# Patient Record
Sex: Male | Born: 1985
Health system: Southern US, Community
[De-identification: ages and names within clinical notes are randomized; demographics above are authoritative.]

## PROBLEM LIST (undated history)

## (undated) DIAGNOSIS — M1990 Unspecified osteoarthritis, unspecified site: Secondary | ICD-10-CM

## (undated) DIAGNOSIS — C8590 Non-Hodgkin lymphoma, unspecified, unspecified site: Secondary | ICD-10-CM

## (undated) DIAGNOSIS — R569 Unspecified convulsions: Secondary | ICD-10-CM

## (undated) DIAGNOSIS — Z8619 Personal history of other infectious and parasitic diseases: Secondary | ICD-10-CM

## (undated) HISTORY — PX: WISDOM TOOTH EXTRACTION: SHX21

## (undated) HISTORY — PX: LYMPH NODE BIOPSY: SHX201

---

## 2007-08-18 DIAGNOSIS — J309 Allergic rhinitis, unspecified: Secondary | ICD-10-CM | POA: Insufficient documentation

## 2012-11-22 DIAGNOSIS — Z9221 Personal history of antineoplastic chemotherapy: Secondary | ICD-10-CM | POA: Insufficient documentation

## 2012-11-22 DIAGNOSIS — Z923 Personal history of irradiation: Secondary | ICD-10-CM | POA: Insufficient documentation

## 2014-08-02 DIAGNOSIS — C8331 Diffuse large B-cell lymphoma, lymph nodes of head, face, and neck: Secondary | ICD-10-CM | POA: Insufficient documentation

## 2014-10-09 DIAGNOSIS — S43431A Superior glenoid labrum lesion of right shoulder, initial encounter: Secondary | ICD-10-CM | POA: Insufficient documentation

## 2015-09-27 ENCOUNTER — Ambulatory Visit
Admission: RE | Admit: 2015-09-27 | Discharge: 2015-09-27 | Disposition: A | Discharge status: Home / Self Care | Payer: BLUE CROSS/BLUE SHIELD | Source: Ambulatory Visit | Attending: Family Medicine | Admitting: Family Medicine

## 2015-09-27 ENCOUNTER — Other Ambulatory Visit: Payer: Self-pay | Admitting: Family Medicine

## 2015-09-27 DIAGNOSIS — M79671 Pain in right foot: Secondary | ICD-10-CM | POA: Diagnosis not present

## 2016-12-17 ENCOUNTER — Ambulatory Visit
Admission: RE | Admit: 2016-12-17 | Discharge: 2016-12-17 | Disposition: A | Discharge status: Home / Self Care | Payer: BLUE CROSS/BLUE SHIELD | Source: Ambulatory Visit | Attending: Family Medicine | Admitting: Family Medicine

## 2016-12-17 ENCOUNTER — Other Ambulatory Visit: Payer: Self-pay | Admitting: Family Medicine

## 2016-12-17 DIAGNOSIS — M25532 Pain in left wrist: Secondary | ICD-10-CM | POA: Diagnosis not present

## 2017-01-28 DIAGNOSIS — G5603 Carpal tunnel syndrome, bilateral upper limbs: Secondary | ICD-10-CM | POA: Insufficient documentation

## 2017-04-14 DIAGNOSIS — C8590 Non-Hodgkin lymphoma, unspecified, unspecified site: Secondary | ICD-10-CM | POA: Insufficient documentation

## 2017-05-14 DIAGNOSIS — M20029 Boutonniere deformity of unspecified finger(s): Secondary | ICD-10-CM | POA: Insufficient documentation

## 2017-05-14 DIAGNOSIS — M25519 Pain in unspecified shoulder: Secondary | ICD-10-CM | POA: Insufficient documentation

## 2017-05-24 DIAGNOSIS — A6001 Herpesviral infection of penis: Secondary | ICD-10-CM | POA: Insufficient documentation

## 2017-05-24 DIAGNOSIS — K219 Gastro-esophageal reflux disease without esophagitis: Secondary | ICD-10-CM | POA: Insufficient documentation

## 2017-05-28 ENCOUNTER — Other Ambulatory Visit: Payer: Self-pay | Admitting: Orthopedic Surgery

## 2017-06-03 DIAGNOSIS — Z713 Dietary counseling and surveillance: Secondary | ICD-10-CM | POA: Insufficient documentation

## 2017-06-07 ENCOUNTER — Encounter: Payer: Self-pay | Admitting: *Deleted

## 2017-06-08 NOTE — Discharge Instructions (Signed)
General Anesthesia, Adult, Care After °These instructions provide you with information about caring for yourself after your procedure. Your health care provider may also give you more specific instructions. Your treatment has been planned according to current medical practices, but problems sometimes occur. Call your health care provider if you have any problems or questions after your procedure. °What can I expect after the procedure? °After the procedure, it is common to have: °· Vomiting. °· A sore throat. °· Mental slowness. ° °It is common to feel: °· Nauseous. °· Cold or shivery. °· Sleepy. °· Tired. °· Sore or achy, even in parts of your body where you did not have surgery. ° °Follow these instructions at home: °For at least 24 hours after the procedure: °· Do not: °? Participate in activities where you could fall or become injured. °? Drive. °? Use heavy machinery. °? Drink alcohol. °? Take sleeping pills or medicines that cause drowsiness. °? Make important decisions or sign legal documents. °? Take care of children on your own. °· Rest. °Eating and drinking °· If you vomit, drink water, juice, or soup when you can drink without vomiting. °· Drink enough fluid to keep your urine clear or pale yellow. °· Make sure you have little or no nausea before eating solid foods. °· Follow the diet recommended by your health care provider. °General instructions °· Have a responsible adult stay with you until you are awake and alert. °· Return to your normal activities as told by your health care provider. Ask your health care provider what activities are safe for you. °· Take over-the-counter and prescription medicines only as told by your health care provider. °· If you smoke, do not smoke without supervision. °· Keep all follow-up visits as told by your health care provider. This is important. °Contact a health care provider if: °· You continue to have nausea or vomiting at home, and medicines are not helpful. °· You  cannot drink fluids or start eating again. °· You cannot urinate after 8-12 hours. °· You develop a skin rash. °· You have fever. °· You have increasing redness at the site of your procedure. °Get help right away if: °· You have difficulty breathing. °· You have chest pain. °· You have unexpected bleeding. °· You feel that you are having a life-threatening or urgent problem. °This information is not intended to replace advice given to you by your health care provider. Make sure you discuss any questions you have with your health care provider. °Document Released: 10/12/2000 Document Revised: 12/09/2015 Document Reviewed: 06/20/2015 °Elsevier Interactive Patient Education © 2018 [employer] ° °

## 2017-06-14 ENCOUNTER — Encounter
Admission: RE | Disposition: A | Discharge status: Home / Self Care | Payer: Self-pay | Source: Ambulatory Visit | Attending: Orthopedic Surgery

## 2017-06-14 ENCOUNTER — Ambulatory Visit
Admission: RE | Admit: 2017-06-14 | Discharge: 2017-06-14 | Disposition: A | Discharge status: Home / Self Care | Payer: BLUE CROSS/BLUE SHIELD | Source: Ambulatory Visit | Attending: Orthopedic Surgery | Admitting: Orthopedic Surgery

## 2017-06-14 ENCOUNTER — Ambulatory Visit: Payer: BLUE CROSS/BLUE SHIELD | Admitting: Anesthesiology

## 2017-06-14 DIAGNOSIS — Z8571 Personal history of Hodgkin lymphoma: Secondary | ICD-10-CM | POA: Insufficient documentation

## 2017-06-14 DIAGNOSIS — Y929 Unspecified place or not applicable: Secondary | ICD-10-CM | POA: Insufficient documentation

## 2017-06-14 DIAGNOSIS — M7551 Bursitis of right shoulder: Secondary | ICD-10-CM | POA: Diagnosis not present

## 2017-06-14 DIAGNOSIS — X503XXA Overexertion from repetitive movements, initial encounter: Secondary | ICD-10-CM | POA: Diagnosis not present

## 2017-06-14 DIAGNOSIS — S43431A Superior glenoid labrum lesion of right shoulder, initial encounter: Secondary | ICD-10-CM | POA: Insufficient documentation

## 2017-06-14 HISTORY — DX: Unspecified osteoarthritis, unspecified site: M19.90

## 2017-06-14 HISTORY — PX: SHOULDER ARTHROSCOPY WITH LABRAL REPAIR: SHX5691

## 2017-06-14 HISTORY — DX: Non-Hodgkin lymphoma, unspecified, unspecified site: C85.90

## 2017-06-14 HISTORY — DX: Unspecified convulsions: R56.9

## 2017-06-14 SURGERY — ARTHROSCOPY, SHOULDER, WITH GLENOID LABRUM REPAIR
Anesthesia: Regional | Laterality: Right | Wound class: Clean

## 2017-06-14 MED ORDER — CHLORHEXIDINE GLUCONATE CLOTH 2 % EX PADS: 6.0000 | MEDICATED_PAD | Freq: Once | CUTANEOUS | Status: DC

## 2017-06-14 MED ORDER — LACTATED RINGERS IV SOLN
10.0000 mL/h | INTRAVENOUS | Status: DC
  Administered 2017-06-14: 10 mL/h via INTRAVENOUS
  Administered 2017-06-14: 10:00:00 via INTRAVENOUS

## 2017-06-14 MED ORDER — MIDAZOLAM HCL 2 MG/2ML IJ SOLN
INTRAMUSCULAR | Status: DC | PRN
  Administered 2017-06-14: 2 mg via INTRAVENOUS

## 2017-06-14 MED ORDER — EPHEDRINE SULFATE 50 MG/ML IJ SOLN
INTRAMUSCULAR | Status: DC | PRN
  Administered 2017-06-14 (×2): 10 mg via INTRAVENOUS

## 2017-06-14 MED ORDER — PROPOFOL 10 MG/ML IV BOLUS
INTRAVENOUS | Status: DC | PRN
  Administered 2017-06-14: 200 mg via INTRAVENOUS

## 2017-06-14 MED ORDER — GLYCOPYRROLATE 0.2 MG/ML IJ SOLN
INTRAMUSCULAR | Status: DC | PRN
  Administered 2017-06-14: 0.2 mg via INTRAVENOUS

## 2017-06-14 MED ORDER — ROPIVACAINE HCL 5 MG/ML IJ SOLN
INTRAMUSCULAR | Status: DC | PRN
  Administered 2017-06-14: 40 mL via PERINEURAL

## 2017-06-14 MED ORDER — CEFAZOLIN SODIUM-DEXTROSE 2-4 GM/100ML-% IV SOLN
2.0000 g | INTRAVENOUS | Status: AC
  Administered 2017-06-14: 2 g via INTRAVENOUS

## 2017-06-14 MED ORDER — ONDANSETRON HCL 4 MG/2ML IJ SOLN
INTRAMUSCULAR | Status: DC | PRN
  Administered 2017-06-14: 4 mg via INTRAVENOUS

## 2017-06-14 MED ORDER — ACETAMINOPHEN 160 MG/5ML PO SOLN: 325.0000 mg | ORAL | Status: DC | PRN

## 2017-06-14 MED ORDER — FENTANYL CITRATE (PF) 100 MCG/2ML IJ SOLN: 25.0000 ug | INTRAMUSCULAR | Status: DC | PRN

## 2017-06-14 MED ORDER — ACETAMINOPHEN 325 MG PO TABS
325.0000 mg | ORAL_TABLET | ORAL | Status: DC | PRN
  Administered 2017-06-14: 650 mg via ORAL

## 2017-06-14 MED ORDER — OXYCODONE HCL 5 MG PO TABS: 5.0000 mg | ORAL_TABLET | ORAL | 0 refills | Status: DC | PRN

## 2017-06-14 MED ORDER — ONDANSETRON HCL 4 MG PO TABS: 4.0000 mg | ORAL_TABLET | Freq: Three times a day (TID) | ORAL | 0 refills | Status: DC | PRN

## 2017-06-14 MED ORDER — FENTANYL CITRATE (PF) 100 MCG/2ML IJ SOLN
INTRAMUSCULAR | Status: DC | PRN
  Administered 2017-06-14: 100 ug via INTRAVENOUS

## 2017-06-14 MED ORDER — PROMETHAZINE HCL 25 MG/ML IJ SOLN: 6.2500 mg | INTRAMUSCULAR | Status: DC | PRN

## 2017-06-14 MED ORDER — LIDOCAINE HCL (PF) 1 % IJ SOLN
INTRAMUSCULAR | Status: DC | PRN
  Administered 2017-06-14: 10 mL

## 2017-06-14 MED ORDER — OXYCODONE HCL 5 MG PO TABS: 5.0000 mg | ORAL_TABLET | Freq: Once | ORAL | Status: DC | PRN

## 2017-06-14 MED ORDER — LIDOCAINE HCL (CARDIAC) 20 MG/ML IV SOLN
INTRAVENOUS | Status: DC | PRN
  Administered 2017-06-14: 40 mg via INTRATRACHEAL

## 2017-06-14 MED ORDER — BUPIVACAINE HCL (PF) 0.25 % IJ SOLN
INTRAMUSCULAR | Status: DC | PRN
  Administered 2017-06-14: 10 mL

## 2017-06-14 MED ORDER — OXYCODONE HCL 5 MG/5ML PO SOLN: 5.0000 mg | Freq: Once | ORAL | Status: DC | PRN

## 2017-06-14 SURGICAL SUPPLY — 42 items
ADAPTER IRRIG TUBE 2 SPIKE SOL (ADAPTER) ×4 IMPLANT
ANCHOR SUT  BIOC ST 3X14.5 (Orthopedic Implant) ×2 IMPLANT
ANCHOR SUT BIOC ST 3X14.5 (Orthopedic Implant) ×2 IMPLANT
BUR RADIUS 4.0X18.5 (BURR) ×2 IMPLANT
CANNULA 5.75X7 CRYSTAL CLEAR (CANNULA) ×2 IMPLANT
CANNULA PARTIAL THREAD 2X7 (CANNULA) ×2 IMPLANT
COOLER POLAR GLACIER W/PUMP (MISCELLANEOUS) ×2 IMPLANT
DRAPE INCISE IOBAN 66X45 STRL (DRAPES) ×2 IMPLANT
DRAPE SHEET LG 3/4 BI-LAMINATE (DRAPES) ×2 IMPLANT
DURAPREP 26ML APPLICATOR (WOUND CARE) ×6 IMPLANT
GAUZE PETRO XEROFOAM 1X8 (MISCELLANEOUS) ×2 IMPLANT
GAUZE SPONGE 4X4 12PLY STRL (GAUZE/BANDAGES/DRESSINGS) ×2 IMPLANT
GLOVE INDICATOR 8.0 STRL GRN (GLOVE) ×2 IMPLANT
GLOVE SURG 9.0 ORTHO LTXF (GLOVE) ×2 IMPLANT
GLOVE SURG ORTHO 8.5 STRL (GLOVE) ×2 IMPLANT
GOWN STRL REUS TWL 2XL XL LVL4 (GOWN DISPOSABLE) ×2 IMPLANT
GOWN STRL REUS W/ TWL LRG LVL3 (GOWN DISPOSABLE) ×1 IMPLANT
GOWN STRL REUS W/TWL LRG LVL3 (GOWN DISPOSABLE) ×1
IV LACTATED RINGER IRRG 3000ML (IV SOLUTION) ×9
IV LR IRRIG 3000ML ARTHROMATIC (IV SOLUTION) ×9 IMPLANT
KIT STABILIZATION SHOULDER (MISCELLANEOUS) ×2 IMPLANT
KIT SUTURETAK 3.0 INSERT PERC (KITS) ×2 IMPLANT
MASK FACE SPIDER DISP (MASK) ×2 IMPLANT
NEEDLE FILTER BLUNT 18X 1/2SAF (NEEDLE) ×1
NEEDLE FILTER BLUNT 18X1 1/2 (NEEDLE) ×1 IMPLANT
NEPTUNE MANIFOLD (MISCELLANEOUS) ×2 IMPLANT
PACK ARTHROSCOPY SHOULDER (MISCELLANEOUS) ×2 IMPLANT
PAD GROUND ADULT SPLIT (MISCELLANEOUS) ×2 IMPLANT
PAD WRAPON POLAR SHDR UNIV (MISCELLANEOUS) ×1 IMPLANT
SET TUBE SUCT SHAVER OUTFL 24K (TUBING) ×2 IMPLANT
SET TUBE TIP INTRA-ARTICULAR (MISCELLANEOUS) ×2 IMPLANT
STRIP CLOSURE SKIN 1/2X4 (GAUZE/BANDAGES/DRESSINGS) ×2 IMPLANT
SUT ETHILON 4-0 (SUTURE) ×2
SUT ETHILON 4-0 FS2 18XMFL BLK (SUTURE) ×2
SUT LASSO 90 DEG SD STR (SUTURE) ×2 IMPLANT
SUT PERFECTPASSER WHITE CART (SUTURE) ×2 IMPLANT
SUTURE ETHLN 4-0 FS2 18XMF BLK (SUTURE) ×2 IMPLANT
SYRINGE 10CC LL (SYRINGE) ×2 IMPLANT
TAPE MICROFOAM 4IN (TAPE) ×2 IMPLANT
TUBING ARTHRO INFLOW-ONLY STRL (TUBING) ×2 IMPLANT
WAND HAND CNTRL MULTIVAC 90 (MISCELLANEOUS) ×2 IMPLANT
WRAPON POLAR PAD SHDR UNIV (MISCELLANEOUS) ×2

## 2017-06-14 NOTE — Anesthesia Preprocedure Evaluation (Addendum)
Anesthesia Evaluation  Patient identified by MRN, date of birth, ID band Patient awake    Reviewed: Allergy & Precautions, NPO status   Airway Mallampati: II  TM Distance: >3 FB     Dental no notable dental hx.    Pulmonary neg pulmonary ROS,    breath sounds clear to auscultation       Cardiovascular negative cardio ROS   Rhythm:Regular Rate:Normal     Neuro/Psych    GI/Hepatic negative GI ROS, Neg liver ROS,   Endo/Other    Renal/GU negative Renal ROS     Musculoskeletal  (+) Arthritis ,   Abdominal   Peds  Hematology Non-Hodgkin's lymphoma - in remission, completed tx in 2014   Anesthesia Other Findings   Reproductive/Obstetrics                            Anesthesia Physical Anesthesia Plan  ASA: II  Anesthesia Plan: General and Regional   Post-op Pain Management:  Regional for Post-op pain   Induction: Intravenous  PONV Risk Score and Plan: Ondansetron and Midazolam  Airway Management Planned: LMA  Additional Equipment:   Intra-op Plan:   Post-operative Plan:   Informed Consent: I have reviewed the patients History and Physical, chart, labs and discussed the procedure including the risks, benefits and alternatives for the proposed anesthesia with the patient or authorized representative who has indicated his/her understanding and acceptance.   Dental advisory given  Plan Discussed with: CRNA  Anesthesia Plan Comments:        Anesthesia Quick Evaluation

## 2017-06-14 NOTE — Op Note (Signed)
06/14/2017  12:25 PM  PATIENT:  Thomas Mckay  31 y.o. male  PRE-OPERATIVE DIAGNOSIS:  Right shoulder superior labral tear  POST-OPERATIVE DIAGNOSIS:  Right shoulder superior labral tear and subacromial bursitis  PROCEDURE:  Procedure(s): RIGHT SHOULDER ARTHROSCOPIC SUPERIOR LABRAL REPAIR AND SUBACROMIAL DEBRIDEMENT/BURSECTOMY  SURGEON:  Surgeon(s) and Role:    Thornton Park, MD - Primary  ANESTHESIA:   local, general and paracervical block   PREOPERATIVE INDICATIONS:  Thomas Mckay is a  31 y.o. male with a diagnosis of superior labral tear of the right shoulder who failed conservative measures and elected for surgical management.    I discussed the risks and benefits of surgery. The risks include but are not limited to infection, bleeding, nerve or blood vessel injury, joint stiffness or loss of motion, persistent pain, weakness or instability, and hardware failure and the need for further surgery. Medical risks include but are not limited to DVT and pulmonary embolism, myocardial infarction, stroke, pneumonia, respiratory failure and death. Patient understood these risks and wished to proceed.   OPERATIVE IMPLANTS: Arthrex bio suturetak anchors  2   OPERATIVE FINDINGS:  Right shoulder superior labral tear   OPERATIVE PROCEDURE:  I met with the patient and his wife in the preoperative area.  He had a interscalene block in the pre-op area by the anesthesia service.  I signed the right shoulder according the hospital's correct site of surgery protocol.  I answered all questions by the patient and his wife. Patient was brought to the operating room. He underwent general endotracheal intubation. He was then positioned in a beachchair position. All bony prominences were adequately padded including the lower extremities.  Examination under anesthesia revealed no instability with load and shift testing, and a negative sulcus sign testing respectively.  The patient was then prepped and  draped in a sterile fashion. The patient received 2 grams of kefzol prior to the onset of the case.  A timeout was performed to verify the patient's name, date of birth, medical record number, correct site of surgery and correct procedure to be performed.The timeout was also used to verify the patient received antibiotics that all appropriate instruments, implants and radiographic studies were available in the room. Once all in attendance were in agreement case began.  Bony landmarks were drawn out with a surgical marker along with proposed incisions. These were pre-injected with 1% lidocaine plain. An 11 blade was used to establish a posterior portal through which the arthroscope was placed in the glenohumeral joint. An anterior portal was established under direct visualization using an 18-gauge spinal needle for localization. A 5.75 mm arthroscopic cannula was inserted through the anterior portal. A full diagnostic examination of the glenohumeral joint was performed.  Findings on arthroscopy included an isolated superior labral tear.  An anterolateral portal was established again under direct visualization using an 18-gauge spinal needle. A 7 mm cannula was placed through this anterolateral portal.  The nonarticular superior glenoid was then burred with a 4.81m resector shaver blade to remove torn fibers of the labrum until punctate bleeding was achieved An Arthrex BNIKEanchor was then placed into the nonarticular portion of superior glenoid.anterior to the biceps anchor.   A 90 degree Arthrex suture lasso was then used to shuttle a single limb of this anchor through the superior labrum.  Arthroscopic knot tying technique was then used to approximate the superior labrum to the glenoid.  A percutaneous technique was then used to drill a second anchor using the  Arthrex percutaneous drill guide set.   A second anchor was placed posterior to the biceps anchor after the hole was drilled.  The 90 degree  suture lasso was again used to pass a single limb of the BioSuturetak anchor under the labrum.  An arthroscopic knot tying technique was again used to repair the labrum back down to the glenoid.  The repair was then probed and found to have excellent stability.  Final arthroscopic images were taken.   The glenohumeral joint was then copiously irrigated.  All arthroscopic instruments were removed.    The arthroscope was placed into the subacromial space. Extensive bursitis was encountered. A lateral portal was established under direct visualization using an 18-gauge spinal needle. Through the lateral portal a 4.0 resector shaver blade and a 90 ArthroCare wand were used to debride subacromial bursitis. Final arthroscopic images were taken. The patient did not have subacromial spurring and did not require subacromial decompression or distal clavicle excision.  The four arthroscopic portals were closed with 4-0 nylon. A dry, sterile dressing was applied to the right shoulder, along with  a Polar Care sleeve. Patient's right arm was then placed in an abduction sling. He was awoken and brought to PACU in stable condition. I was scrubbed and present for the entire case and all sharp and instrument counts were correct at the conclusion the case. I spoke with the patient's wife in the postop consultation room to let her know the operation was successful and performed without complication.      Timoteo Gaul, MD

## 2017-06-14 NOTE — H&P (Signed)
The patient has been re-examined, and the chart reviewed, and there have been no interval changes to the documented history and physical.    The risks, benefits, and alternatives have been discussed at length, and the patient is willing to proceed.   

## 2017-06-14 NOTE — Anesthesia Procedure Notes (Signed)
Anesthesia Regional Block: Interscalene brachial plexus block   Pre-Anesthetic Checklist: ,, timeout performed, Correct Patient, Correct Site, Correct Laterality, Correct Procedure, Correct Position, site marked, Risks and benefits discussed,  Surgical consent,  Pre-op evaluation,  At surgeon's request and post-op pain management  Laterality: Right  Prep: chloraprep       Needles:  Injection technique: Single-shot  Needle Type: Stimiplex     Needle Length: 10cm  Needle Gauge: 21     Additional Needles:   Procedures:,,,, ultrasound used (permanent image in chart),,,,  Narrative:  Start time: 06/14/2017 8:40 AM End time: 06/14/2017 8:45 AM Injection made incrementally with aspirations every 5 mL.  Performed by: Personally  Anesthesiologist: Veda Canning, MD

## 2017-06-14 NOTE — Progress Notes (Addendum)
Assisted Dr. Veda Canning with right, interscalene brachial plexus nerve block. Side rails up, monitors on throughout procedure. See vital signs in flow sheet. Tolerated Procedure well.

## 2017-06-14 NOTE — Transfer of Care (Signed)
Immediate Anesthesia Transfer of Care Note  Patient: Thomas Mckay  Procedure(s) Performed: SHOULDER ARTHROSCOPY WITH LABRAL REPAIR and subacromial debridement. (Right )  Patient Location: PACU  Anesthesia Type: General, Regional  Level of Consciousness: awake, alert  and patient cooperative  Airway and Oxygen Therapy: Patient Spontanous Breathing and Patient connected to supplemental oxygen  Post-op Assessment: Post-op Vital signs reviewed, Patient's Cardiovascular Status Stable, Respiratory Function Stable, Patent Airway and No signs of Nausea or vomiting  Post-op Vital Signs: Reviewed and stable  Complications: No apparent anesthesia complications

## 2017-06-14 NOTE — Anesthesia Procedure Notes (Signed)
Procedure Name: LMA Insertion Performed by: Lind Guest, CRNA Pre-anesthesia Checklist: Patient identified, Emergency Drugs available, Suction available, Patient being monitored and Timeout performed Patient Re-evaluated:Patient Re-evaluated prior to induction Oxygen Delivery Method: Circle system utilized Preoxygenation: Pre-oxygenation with 100% oxygen Induction Type: IV induction LMA: LMA inserted LMA Size: 4.0

## 2017-06-14 NOTE — Anesthesia Postprocedure Evaluation (Signed)
Anesthesia Post Note  Patient: Thomas Mckay  Procedure(s) Performed: SHOULDER ARTHROSCOPY WITH LABRAL REPAIR and subacromial debridement. (Right )  Patient location during evaluation: PACU Anesthesia Type: Regional Level of consciousness: awake Pain management: pain level controlled Vital Signs Assessment: post-procedure vital signs reviewed and stable Respiratory status: respiratory function stable Cardiovascular status: stable Postop Assessment: no signs of nausea or vomiting Anesthetic complications: no    Veda Canning

## 2017-06-15 ENCOUNTER — Encounter: Payer: Self-pay | Admitting: Orthopedic Surgery

## 2018-10-06 ENCOUNTER — Ambulatory Visit: Payer: BLUE CROSS/BLUE SHIELD | Attending: Neurology

## 2018-10-06 DIAGNOSIS — R0683 Snoring: Secondary | ICD-10-CM | POA: Diagnosis not present

## 2018-10-06 DIAGNOSIS — F5101 Primary insomnia: Secondary | ICD-10-CM | POA: Insufficient documentation

## 2019-01-24 DIAGNOSIS — C8511 Unspecified B-cell lymphoma, lymph nodes of head, face, and neck: Secondary | ICD-10-CM | POA: Diagnosis not present

## 2019-01-24 DIAGNOSIS — R635 Abnormal weight gain: Secondary | ICD-10-CM | POA: Diagnosis not present

## 2019-01-24 DIAGNOSIS — C8331 Diffuse large B-cell lymphoma, lymph nodes of head, face, and neck: Secondary | ICD-10-CM | POA: Diagnosis not present

## 2019-02-01 ENCOUNTER — Other Ambulatory Visit: Payer: Self-pay

## 2019-02-01 ENCOUNTER — Ambulatory Visit: Payer: Self-pay | Admitting: Internal Medicine

## 2019-02-01 ENCOUNTER — Encounter: Payer: Self-pay | Admitting: Internal Medicine

## 2019-02-01 VITALS — BP 119/75 | HR 82 | Temp 97.9°F | Resp 16 | Ht 68.0 in | Wt 194.0 lb

## 2019-02-01 DIAGNOSIS — M25579 Pain in unspecified ankle and joints of unspecified foot: Secondary | ICD-10-CM | POA: Insufficient documentation

## 2019-02-01 DIAGNOSIS — M25559 Pain in unspecified hip: Secondary | ICD-10-CM | POA: Insufficient documentation

## 2019-02-01 DIAGNOSIS — S39011A Strain of muscle, fascia and tendon of abdomen, initial encounter: Secondary | ICD-10-CM

## 2019-02-01 DIAGNOSIS — S335XXA Sprain of ligaments of lumbar spine, initial encounter: Secondary | ICD-10-CM | POA: Insufficient documentation

## 2019-02-01 DIAGNOSIS — S93429A Sprain of deltoid ligament of unspecified ankle, initial encounter: Secondary | ICD-10-CM | POA: Insufficient documentation

## 2019-02-01 DIAGNOSIS — M7660 Achilles tendinitis, unspecified leg: Secondary | ICD-10-CM | POA: Insufficient documentation

## 2019-02-01 DIAGNOSIS — S6390XA Sprain of unspecified part of unspecified wrist and hand, initial encounter: Secondary | ICD-10-CM | POA: Insufficient documentation

## 2019-02-01 NOTE — Progress Notes (Signed)
Abd pain since Monday night.  States it's constant and achy -sore.  Has tried Ibu without relief.

## 2019-02-01 NOTE — Progress Notes (Signed)
S - Patient is a 33 y.o. 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 Started Monday evening after picking up his child. Has been more constant Pain described as achy and 3/10 at worst Location of pain is peri-umbilical No relationship to meals No N/V,  No fevers, no recent Covid sx's No diarrhea or constipation, last BM yesterday  No blood with BM's , no dark stool  No h/o major abdominal pains, + h/o reflux and this not like his reflux No tob history. Has gained weight in recent past Not lift weights with any regularity Works at [employer]    Current Outpatient Medications on File Prior to Visit  Medication Sig Dispense Refill  . valACYclovir (VALTREX) 500 MG tablet valacyclovir 500 mg tablet    . gabapentin (NEURONTIN) 300 MG capsule gabapentin 300 mg capsule    . ibuprofen (ADVIL) 200 MG tablet Take by mouth.    Marland Kitchen omeprazole (PRILOSEC) 40 MG capsule Take 40 mg daily by mouth.    . ondansetron (ZOFRAN) 4 MG tablet Take 1 tablet (4 mg total) by mouth every 8 (eight) hours as needed for nausea or vomiting. 30 tablet 0  . valACYclovir (VALTREX) 500 MG tablet Take 500 mg daily by mouth.     No current facility-administered medications on file prior to visit.     No Known Allergies    O - NAD, masked  BP 119/75 (BP Location: Right Arm, Patient Position: Sitting, Cuff Size: Normal)   Pulse 82   Temp 97.9 F (36.6 C) (Oral)   Resp 16   Ht 5\' 8"  (1.727 m)   Wt 194 lb (88 kg)   SpO2 96%   BMI 29.50 kg/m     Weight 186 in March  HEENT - sclera anicteric Car - RRR without m/g/r Abd - soft, obese, tender to palpate peri-umbilical, NT epigastrium, NT upper quadrants, NT lower quadrants, no rebound or guarding, questioned minimally distended, not marked, BS +, no masses, no HSM, no obvious hernia  Affect not flat, approp with conversation No increase pain with partial sit-up,  but notes feels achy like just worked out  Reviewed labs from March, 2020  Ass: 1. Abdominal Pain - prob abdominal muscle strain as source, no obvous hernia. Not sx's c/w any GI'itis, not likely GERD, not surgical concern  Educated on likely dx Relative rest from any heavy lifting, including kids, no straining with Valsalva type activities rec'ed, not do sit-ups routinely Can use ibuprofen, OTC prn short term and not use too frequently as can increase reflux sx's. Monitor, and if sx's increasing/worsening or not improving over time as outlined today, should f/u.  Not rush to any labs or any imaging studies presently

## 2019-02-03 ENCOUNTER — Telehealth: Payer: Self-pay

## 2019-02-03 NOTE — Telephone Encounter (Signed)
Please schedule a follow-up visit for him on Monday. If he has severe pain arise, any N/V, fevers, needs to go to ER to be seen immediately.

## 2019-02-03 NOTE — Telephone Encounter (Signed)
Pt states that he was told to call back if he had any lumps in his belly button area and he noticed that he now has a lump in that area. He is in the same amount of pain as the office visit

## 2019-02-03 NOTE — Telephone Encounter (Signed)
appt made for pt for Monday

## 2019-02-06 ENCOUNTER — Other Ambulatory Visit: Payer: Self-pay

## 2019-02-06 ENCOUNTER — Encounter: Payer: Self-pay | Admitting: Internal Medicine

## 2019-02-06 ENCOUNTER — Ambulatory Visit: Payer: Self-pay | Admitting: Internal Medicine

## 2019-02-06 VITALS — BP 123/68 | HR 89 | Temp 98.0°F | Resp 20 | Ht 67.0 in | Wt 193.0 lb

## 2019-02-06 DIAGNOSIS — R1033 Periumbilical pain: Secondary | ICD-10-CM

## 2019-02-06 NOTE — Progress Notes (Addendum)
Please refer to this addended note for the accurate documentation of the 7/20 office visit.   S - Patient is a 33 y.o. 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 Last note reviewed.  He called two days later noting he was told to call back if he had any lumps in his belly button area and he noticed that he now has a lump in that area. He noted was in the same amount of pain as the office visit. A f/u appt was made for today.   His sx's started Monday evening after picking up his child, and has remained since Pain described as achy and feels like just did a bunch of sit-ups.  Location of pain is peri-umbilical Still no relationship to meals, no vomiting No fevers, no recent Covid sx's No diarrhea or constipation No blood with BM's , no dark stool  No h/o major abdominal pains, + h/o reflux and this not like his reflux No tob history. Has gained a little weight in recent past Not lift weights with any regularity Works at [employer]  Meds taking - has not taken the gabapentin in the last two weeks, only med takes regularly over this time is alavert for allergies, and has tried some ibuprofen prn.  Current Outpatient Medications on File Prior to Visit  Medication Sig Dispense Refill  . gabapentin (NEURONTIN) 300 MG capsule gabapentin 300 mg capsule    . ibuprofen (ADVIL) 200 MG tablet Take by mouth.    Marland Kitchen omeprazole (PRILOSEC) 40 MG capsule Take 40 mg daily by mouth.    . ondansetron (ZOFRAN) 4 MG tablet Take 1 tablet (4 mg total) by mouth every 8 (eight) hours as needed for nausea or vomiting. 30 tablet 0  . valACYclovir (VALTREX) 500 MG tablet Take 500 mg daily by mouth.    . valACYclovir (VALTREX) 500 MG tablet valacyclovir 500 mg tablet     No current facility-administered medications on file prior to visit.     No Known  Allergies   O - NAD, masked  BP 123/68 (BP Location: Right Arm, Patient Position: Sitting, Cuff Size: Large)   Pulse 89   Temp 98 F (36.7 C) (Oral)   Resp 20   Ht 5\' 7"  (1.702 m)   Wt 193 lb (87.5 kg)   SpO2 95%   BMI 30.23 kg/m   Weight 186 in March. 194 last visit  HEENT - sclera anicteric Car - RRR  Abd - soft, obese, tender to palpate immediately over the umbilicus in the small protrusion in the upper half which was fleshy colored and not erythematous. Less tender to palpate the peri-umbilical region surrounding with more tender above than below or on either side, not marked NT epigastrium, NT upper quadrants, NT lower quadrants, no rebound or guarding, BS +, no masses, no obvious HSM Affect not flat, approp with conversation  Ass: 1. Abdominal Pain - Discussed last visit that abdominal muscle strain seemed a likely  source, and educated on this. Feel worth checking an ultrasound at this point to help assess for hernia concern   Educated on this  U/S ordered and office calling to help get arranged Relative rest from any heavy lifting, including kids, no straining with Valsalva type activities to continue Can use ibuprofen, OTC prn short term and not  use too frequently as can increase reflux sx's. Monitor, and if sx's increasing/worsening with the pain becoming more severe, should f/u. If he notices that the pain is suddenly more severe and very painful, needs to go to the ER to be assessed.

## 2019-02-13 ENCOUNTER — Ambulatory Visit
Admission: RE | Admit: 2019-02-13 | Discharge: 2019-02-13 | Disposition: A | Discharge status: Home / Self Care | Payer: 59 | Source: Ambulatory Visit | Attending: Internal Medicine | Admitting: Internal Medicine

## 2019-02-13 ENCOUNTER — Other Ambulatory Visit: Payer: Self-pay

## 2019-02-13 DIAGNOSIS — R1033 Periumbilical pain: Secondary | ICD-10-CM | POA: Diagnosis not present

## 2019-02-22 ENCOUNTER — Other Ambulatory Visit: Payer: Self-pay

## 2019-02-22 ENCOUNTER — Encounter: Payer: Self-pay | Admitting: Internal Medicine

## 2019-02-22 ENCOUNTER — Ambulatory Visit: Payer: Self-pay | Admitting: Internal Medicine

## 2019-02-22 VITALS — BP 125/79 | HR 62 | Temp 96.3°F | Resp 20 | Ht 67.0 in | Wt 194.0 lb

## 2019-02-22 DIAGNOSIS — R809 Proteinuria, unspecified: Secondary | ICD-10-CM | POA: Insufficient documentation

## 2019-02-22 DIAGNOSIS — R109 Unspecified abdominal pain: Secondary | ICD-10-CM

## 2019-02-22 DIAGNOSIS — R3121 Asymptomatic microscopic hematuria: Secondary | ICD-10-CM

## 2019-02-22 LAB — POCT URINALYSIS DIPSTICK
Bilirubin, UA: NEGATIVE
Glucose, UA: NEGATIVE
Ketones, UA: NEGATIVE
Leukocytes, UA: NEGATIVE
Nitrite, UA: NEGATIVE
Protein, UA: NEGATIVE
Spec Grav, UA: 1.02 (ref 1.010–1.025)
Urobilinogen, UA: 0.2 E.U./dL
pH, UA: 6 (ref 5.0–8.0)

## 2019-02-22 NOTE — Progress Notes (Signed)
Unable to assess temperature due to Marteze has been drinking water for the UA,

## 2019-02-22 NOTE — Progress Notes (Signed)
S - Patientis a 33 y.o. 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 An U/S was ordered and followed up today with a nurse visit to provide a urine specimen for f/u of prior proteinuria concern and I saw to review his U/S results as he noted he had questions..   His sx's started after picking up his child, and has persisted to date, with it feeling a little better in the more recent past. Still sensitive to touch the area in his belly button. Thinks may be eating less noted, but denied any more diffuse abdominal pains, no N/V, no fevers, no black stools, and no increased swelling or size of the small lump in his belly button.   No h/o major abdominal pains in his past, + h/o reflux and this not like his reflux No tob history.  Not lift weights with any regularity Works at [employer]  Meds taking - noted only med takes regularly is alavert for allergies, and has tried some ibuprofen prn.  Current Outpatient Medications on File Prior to Visit  Medication Sig Dispense Refill  . gabapentin (NEURONTIN) 300 MG capsule gabapentin 300 mg capsule    . ibuprofen (ADVIL) 200 MG tablet Take by mouth.    Marland Kitchen omeprazole (PRILOSEC) 40 MG capsule Take 40 mg daily by mouth.    . ondansetron (ZOFRAN) 4 MG tablet Take 1 tablet (4 mg total) by mouth every 8 (eight) hours as needed for nausea or vomiting. 30 tablet 0  . valACYclovir (VALTREX) 500 MG tablet Take 500 mg daily by mouth.    . valACYclovir (VALTREX) 500 MG tablet valacyclovir 500 mg tablet     No current facility-administered medications on file prior to visit.    No Known Allergies   O - NAD,masked  BP 125/79 (BP Location: Left Arm, Patient Position: Sitting, Cuff Size: Large)   Pulse 62   Temp (!)  96.3 F (35.7 C) (Axillary)   Resp 20   Ht 5\' 7"  (1.702 m)   Wt 194 lb (88 kg)   SpO2 97%   BMI 30.38 kg/m   Weight 193 last visit   HEENT - sclera anicteric Abd - soft,obese, stilltender to palpate immediately over the umbilicus in the small protrusion in the upper half which was fleshy colored and not erythematous. Affect not flat, approp with conversation  U/S results reviewed - small heterogeneous well-circumscribed nodular area noted that does not appear to represent herniated bowel.   Ass: 1. Abdominal Pain -likely abdominal muscle strain with a small protuberance in the umbilicus that persists and is very sensitive to touch, no evidence of herniated bowel concern by U/S, could be peri-umbilical fat and likely benign and a little better in more recent past   Educated on this  U/S result was printed out and reviewed with him in office Continue with relative rest from any heavy lifting, including kids, no straining with Valsalva type activities to continue Can use ibuprofen, OTC prn short term and not use too frequently as can increase reflux sx's noted last visit Monitor, and if sx's increasing/worsening with the pain again worsening, becoming more severe, should f/u. May need referral to surgeon if more problematic noted. If he notices that the  pain is suddenly more severe and very painful, needs to go to the ER to be assessed like noted last visit.   2. Mild proteinuria noted on annual physical March 2020, with the last 2 years prior having no protein in the urine. His glucose was 101 and his BP's have been good.  Asked for a recheck of his urine in 4-6 months and presented today for that recheck. The urine was negative for protein, but 1+ blood was noted. Negative otherwise.  Will have him f/u again in 4-6 weeks for another recheck of his urine and if blood persists, will send urine for microscopic UA and culture and have a f/u office visit scheduled as well.   3.  Hematuria - as above

## 2019-03-23 ENCOUNTER — Ambulatory Visit: Payer: 59

## 2019-03-23 ENCOUNTER — Other Ambulatory Visit: Payer: Self-pay

## 2019-03-23 DIAGNOSIS — R809 Proteinuria, unspecified: Secondary | ICD-10-CM

## 2019-03-23 DIAGNOSIS — R319 Hematuria, unspecified: Secondary | ICD-10-CM

## 2019-03-23 LAB — POCT URINALYSIS DIPSTICK
Bilirubin, UA: NEGATIVE
Blood, UA: POSITIVE
Glucose, UA: NEGATIVE
Ketones, UA: NEGATIVE
Leukocytes, UA: NEGATIVE
Nitrite, UA: NEGATIVE
Protein, UA: NEGATIVE
Spec Grav, UA: >=1.03 — AB (ref 1.010–1.025)
Urobilinogen, UA: 0.2 E.U./dL
pH, UA: 6 (ref 5.0–8.0)

## 2019-03-23 NOTE — Progress Notes (Signed)
Patient completed a POC urine dipstick and it was positive for blood, as per previous note on 02/22/19 urine will be sent out for urine micro at [facility]

## 2019-03-24 LAB — URINALYSIS, ROUTINE W REFLEX MICROSCOPIC
Bilirubin, UA: NEGATIVE
Glucose, UA: NEGATIVE
Ketones, UA: NEGATIVE
Leukocytes,UA: NEGATIVE
Nitrite, UA: NEGATIVE
Protein,UA: NEGATIVE
RBC, UA: NEGATIVE
Specific Gravity, UA: 1.025 (ref 1.005–1.030)
Urobilinogen, Ur: 0.2 mg/dL (ref 0.2–1.0)
pH, UA: 5.5 (ref 5.0–7.5)

## 2019-03-29 NOTE — Progress Notes (Signed)
Sent this to the patient on Mychart  The urine was sent to the lab after the preliminary result in the office noted possible blood in the urine. The lab result is attached and was completely negative, no protein or blood.  Will continue to monitor at present.

## 2019-05-08 DIAGNOSIS — M9902 Segmental and somatic dysfunction of thoracic region: Secondary | ICD-10-CM | POA: Diagnosis not present

## 2019-05-08 DIAGNOSIS — M9903 Segmental and somatic dysfunction of lumbar region: Secondary | ICD-10-CM | POA: Diagnosis not present

## 2019-05-08 DIAGNOSIS — S336XXA Sprain of sacroiliac joint, initial encounter: Secondary | ICD-10-CM | POA: Diagnosis not present

## 2019-05-08 DIAGNOSIS — M545 Low back pain: Secondary | ICD-10-CM | POA: Diagnosis not present

## 2019-05-08 DIAGNOSIS — M7918 Myalgia, other site: Secondary | ICD-10-CM | POA: Diagnosis not present

## 2019-05-08 DIAGNOSIS — S39012A Strain of muscle, fascia and tendon of lower back, initial encounter: Secondary | ICD-10-CM | POA: Diagnosis not present

## 2019-05-08 DIAGNOSIS — S233XXA Sprain of ligaments of thoracic spine, initial encounter: Secondary | ICD-10-CM | POA: Diagnosis not present

## 2019-05-08 DIAGNOSIS — M9904 Segmental and somatic dysfunction of sacral region: Secondary | ICD-10-CM | POA: Diagnosis not present

## 2019-05-11 DIAGNOSIS — M9903 Segmental and somatic dysfunction of lumbar region: Secondary | ICD-10-CM | POA: Diagnosis not present

## 2019-05-11 DIAGNOSIS — M545 Low back pain: Secondary | ICD-10-CM | POA: Diagnosis not present

## 2019-05-11 DIAGNOSIS — S336XXA Sprain of sacroiliac joint, initial encounter: Secondary | ICD-10-CM | POA: Diagnosis not present

## 2019-05-11 DIAGNOSIS — M7918 Myalgia, other site: Secondary | ICD-10-CM | POA: Diagnosis not present

## 2019-05-11 DIAGNOSIS — S39012A Strain of muscle, fascia and tendon of lower back, initial encounter: Secondary | ICD-10-CM | POA: Diagnosis not present

## 2019-05-11 DIAGNOSIS — S233XXA Sprain of ligaments of thoracic spine, initial encounter: Secondary | ICD-10-CM | POA: Diagnosis not present

## 2019-05-11 DIAGNOSIS — M9902 Segmental and somatic dysfunction of thoracic region: Secondary | ICD-10-CM | POA: Diagnosis not present

## 2019-05-11 DIAGNOSIS — M9904 Segmental and somatic dysfunction of sacral region: Secondary | ICD-10-CM | POA: Diagnosis not present

## 2019-06-20 DIAGNOSIS — M9903 Segmental and somatic dysfunction of lumbar region: Secondary | ICD-10-CM | POA: Diagnosis not present

## 2019-06-20 DIAGNOSIS — M7918 Myalgia, other site: Secondary | ICD-10-CM | POA: Diagnosis not present

## 2019-06-20 DIAGNOSIS — M545 Low back pain: Secondary | ICD-10-CM | POA: Diagnosis not present

## 2019-06-20 DIAGNOSIS — S39012A Strain of muscle, fascia and tendon of lower back, initial encounter: Secondary | ICD-10-CM | POA: Diagnosis not present

## 2019-06-20 DIAGNOSIS — S233XXA Sprain of ligaments of thoracic spine, initial encounter: Secondary | ICD-10-CM | POA: Diagnosis not present

## 2019-06-20 DIAGNOSIS — S336XXA Sprain of sacroiliac joint, initial encounter: Secondary | ICD-10-CM | POA: Diagnosis not present

## 2019-06-20 DIAGNOSIS — M9902 Segmental and somatic dysfunction of thoracic region: Secondary | ICD-10-CM | POA: Diagnosis not present

## 2019-06-20 DIAGNOSIS — M9904 Segmental and somatic dysfunction of sacral region: Secondary | ICD-10-CM | POA: Diagnosis not present

## 2019-06-27 DIAGNOSIS — M7581 Other shoulder lesions, right shoulder: Secondary | ICD-10-CM | POA: Diagnosis not present

## 2019-06-27 DIAGNOSIS — M545 Low back pain: Secondary | ICD-10-CM | POA: Diagnosis not present

## 2019-06-27 DIAGNOSIS — M9907 Segmental and somatic dysfunction of upper extremity: Secondary | ICD-10-CM | POA: Diagnosis not present

## 2019-06-27 DIAGNOSIS — S336XXA Sprain of sacroiliac joint, initial encounter: Secondary | ICD-10-CM | POA: Diagnosis not present

## 2019-06-27 DIAGNOSIS — M7918 Myalgia, other site: Secondary | ICD-10-CM | POA: Diagnosis not present

## 2019-06-27 DIAGNOSIS — M9902 Segmental and somatic dysfunction of thoracic region: Secondary | ICD-10-CM | POA: Diagnosis not present

## 2019-06-27 DIAGNOSIS — M9903 Segmental and somatic dysfunction of lumbar region: Secondary | ICD-10-CM | POA: Diagnosis not present

## 2019-06-27 DIAGNOSIS — S39012A Strain of muscle, fascia and tendon of lower back, initial encounter: Secondary | ICD-10-CM | POA: Diagnosis not present

## 2019-06-27 DIAGNOSIS — S233XXA Sprain of ligaments of thoracic spine, initial encounter: Secondary | ICD-10-CM | POA: Diagnosis not present

## 2019-06-27 DIAGNOSIS — M9904 Segmental and somatic dysfunction of sacral region: Secondary | ICD-10-CM | POA: Diagnosis not present

## 2019-07-04 ENCOUNTER — Encounter: Payer: Self-pay | Admitting: Emergency Medicine

## 2019-07-04 ENCOUNTER — Ambulatory Visit
Admission: EM | Admit: 2019-07-04 | Discharge: 2019-07-04 | Disposition: A | Discharge status: Home / Self Care | Payer: Managed Care, Other (non HMO) | Attending: Family Medicine | Admitting: Family Medicine

## 2019-07-04 ENCOUNTER — Other Ambulatory Visit: Payer: Self-pay

## 2019-07-04 ENCOUNTER — Telehealth: Payer: 59 | Admitting: Physician Assistant

## 2019-07-04 DIAGNOSIS — R69 Illness, unspecified: Secondary | ICD-10-CM | POA: Diagnosis not present

## 2019-07-04 DIAGNOSIS — Z113 Encounter for screening for infections with a predominantly sexual mode of transmission: Secondary | ICD-10-CM

## 2019-07-04 DIAGNOSIS — R360 Urethral discharge without blood: Secondary | ICD-10-CM | POA: Diagnosis not present

## 2019-07-04 DIAGNOSIS — Z8619 Personal history of other infectious and parasitic diseases: Secondary | ICD-10-CM

## 2019-07-04 DIAGNOSIS — R300 Dysuria: Secondary | ICD-10-CM

## 2019-07-04 HISTORY — DX: Personal history of other infectious and parasitic diseases: Z86.19

## 2019-07-04 LAB — URINALYSIS, COMPLETE (UACMP) WITH MICROSCOPIC
Bacteria, UA: NONE SEEN
Bilirubin Urine: NEGATIVE
Glucose, UA: NEGATIVE mg/dL
Leukocytes,Ua: NEGATIVE
Nitrite: NEGATIVE
Protein, ur: NEGATIVE mg/dL
Specific Gravity, Urine: >1.03 — ABNORMAL HIGH (ref 1.005–1.030)
Squamous Epithelial / LPF: NONE SEEN (ref 0–5)
WBC, UA: NONE SEEN WBC/hpf (ref 0–5)
pH: 5.5 (ref 5.0–8.0)

## 2019-07-04 MED ORDER — AZITHROMYCIN 500 MG PO TABS
1000.0000 mg | ORAL_TABLET | Freq: Once | ORAL | Status: AC
  Administered 2019-07-04: 1000 mg via ORAL

## 2019-07-04 MED ORDER — CEFTRIAXONE SODIUM 250 MG IJ SOLR
250.0000 mg | Freq: Once | INTRAMUSCULAR | Status: AC
  Administered 2019-07-04: 250 mg via INTRAMUSCULAR

## 2019-07-04 NOTE — ED Provider Notes (Signed)
Thomas Mckay, Thomas Mckay   Name: Thomas Mckay DOB: 1986-05-29 MRN: [id number] CSN: [id number] PCP: Towanda Malkin, MD  Arrival date and time:  07/04/19 1446  Chief Complaint:  Urinary Frequency and Penile Discharge   NOTE: Prior to seeing the patient today, I have reviewed the triage nursing documentation and vital signs. Clinical staff has updated patient's PMH/PSHx, current medication list, and drug allergies/intolerances to ensure comprehensive history available to assist in medical decision making.   History:   HPI: Thomas Mckay is a 33 y.o. male who presents today with complaints of urinary symptoms that began with acute onset 3-4 days ago. He complains of urinary frequency alone; no dysuria or urgency. He has not appreciated any gross hematuria, nor has he noticed his urine being malodorous. Patient denies any associated nausea, vomiting, fever, or chills. He has not experienced any pain in his lower back, flank area, or lower abdomen. He is not experiencing any difficulties with initiating his urine stream. No rectal pain or pressure. Patient advises that he does not have a history that is significant for recurrent urinary tract infections or urolithiasis. PMH (+) for STIs; has had genital herpes and chlamydia. Patient denies any current penile lesions. Patient confirms the fact engages in unprotected sexual activity; last sexual encounter was this past Sunday. He denies pain in his penis, testicles, or scrotum; no swelling. No has been experiencing "milkly white" discharge for the past 3 days.  Past Medical History:  Diagnosis Date   Arthritis    wrists   Non Hodgkin's lymphoma ([place])    in remission. completed treatments 08/2012   Seizures ([place])    x1. as infant    Past Surgical History:  Procedure Laterality Date   LYMPH NODE BIOPSY     SHOULDER ARTHROSCOPY WITH LABRAL REPAIR Right 06/14/2017   Procedure: SHOULDER ARTHROSCOPY WITH LABRAL REPAIR and subacromial  debridement.;  Surgeon: Thornton Park, MD;  Location: [facility];  Service: Orthopedics;  Laterality: Right;   WISDOM TOOTH EXTRACTION      Family History  Problem Relation Age of Onset   Asthma Mother    Hypertension Father    Asthma Brother     Social History   Tobacco Use   Smoking status: Never Smoker   Smokeless tobacco: Never Used  Substance Use Topics   Alcohol use: Yes    Comment: rare - Holidays   Drug use: Never    Patient Active Problem List   Diagnosis Date Noted   Abdominal pain 02/22/2019   Proteinuria 02/22/2019   Asymptomatic microscopic hematuria 02/22/2019   Pain in joint involving ankle and foot 02/01/2019   Achilles bursitis 02/01/2019   Achilles tendinitis 02/01/2019   Hip pain 02/01/2019   Lumbar sprain 02/01/2019   Sprain of deltoid ligament of ankle 02/01/2019   Sprain of hand 02/01/2019   Gastroesophageal reflux disease 05/24/2017   Type 2 HSV infection of penis 05/24/2017   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    Allergies:   Patient has  no known allergies.  Review of Systems (ROS): Review of Systems  Constitutional: Negative for chills and fever.  Respiratory: Negative for cough and shortness of breath.   Cardiovascular: Negative for chest pain and palpitations.  Gastrointestinal: Negative for abdominal pain, anal bleeding, diarrhea, nausea, rectal pain and vomiting.  Genitourinary: Positive for discharge ("milky white")  and frequency. Negative for decreased urine volume, dysuria, flank pain, genital sores, hematuria, penile pain, penile swelling, scrotal swelling, testicular pain and urgency.  Musculoskeletal: Negative for arthralgias, back pain and joint swelling.  Skin: Negative for color change, pallor and rash.  All other systems reviewed and are negative.    Vital Signs: Today's Vitals   07/04/19 1511 07/04/19 1512 07/04/19 1604  BP:  128/80   Pulse:  76   Resp:  16   Temp:  98.2 F (36.8 C)   TempSrc:  Oral   SpO2:  99%   Weight:  193 lb (87.5 kg)   Height:  5\' 8"  (1.727 m)   PainSc: 0-No pain  0-No pain    Physical Exam: Physical Exam  Constitutional: He is oriented to person, place, and time and well-developed, well-nourished, and in no distress.  HENT:  Head: Normocephalic and atraumatic.  Eyes: Pupils are equal, round, and reactive to light.  Cardiovascular: Normal rate, regular rhythm, normal heart sounds and intact distal pulses.  Pulmonary/Chest: Effort normal and breath sounds normal.  Abdominal: Soft. Normal appearance and bowel sounds are normal. He exhibits no distension. There is no abdominal tenderness. There is no CVA tenderness.  Genitourinary:    Genitourinary Comments: Exam deferred. No penile pain or testicular pain/swelling reported. Urine sample for UA (clean catch) and GC (random spot) collected by patient.   Neurological: He is alert and oriented to person, place, and time. Gait normal.  Skin: Skin is warm and dry. No rash noted. He is not diaphoretic.  Psychiatric: Mood, memory, affect and judgment normal.  Nursing note and vitals reviewed.   Urgent Care Treatments / Results:   LABS: PLEASE NOTE: all labs that were ordered this encounter are listed, however only abnormal results are displayed. Labs Reviewed  URINALYSIS, COMPLETE (UACMP) WITH MICROSCOPIC - Abnormal; Notable for the following components:      Result Value   Specific Gravity, Urine >1.030 (*)      Hgb urine dipstick TRACE (*)    Ketones, ur TRACE (*)    All other components within normal limits  GC/CHLAMYDIA PROBE AMP    EKG: -None  RADIOLOGY: No results found.  PROCEDURES: Procedures  MEDICATIONS RECEIVED THIS VISIT: Medications  cefTRIAXone (ROCEPHIN) injection 250 mg (250 mg Intramuscular Given 07/04/19 1559)  azithromycin (ZITHROMAX) tablet 1,000 mg (1,000 mg Oral Given 07/04/19 1559)    PERTINENT CLINICAL COURSE NOTES/UPDATES:   Initial Impression / Assessment and Plan / Urgent Care Course:  Pertinent labs & imaging results that were available during my care of the patient were personally reviewed by me and considered in my medical decision making (see lab/imaging section of note for values and interpretations).  Thomas Mckay is a 33 y.o. male who presents to [facility] today with complaints of Urinary Frequency and Penile Discharge  Patient is well appearing overall in clinic today. He does not appear to be in any acute distress. Presenting symptoms (see HPI) and exam as documented above. UA negative for infection; no WBC, 0-5 RBC/hpf, no nitrites, LE, or bacteria. Past urine samples have been (+) for proteinuria; no protein in today's sample. Patient  endorses unprotected sexual intercourse on Sunday. (+) penile discharge with no associated bleeding, scrotal, or testicular symptoms. No rectal pain or bleeding. PMH (+) for STI in the past and this is similar; has had genital herpes and chlamydia.Given his symptoms, patient wishes to proceed with prophylactic treatment today rather than waiting on testing results. Patient to be contacted with testing results; verbalized understanding. Patient treated with azithromycin 1 g PO and ceftriaxone 250 mg IM today in clinic. He was advised that he should follow up with PCP should his symptoms not resolve.   I have reviewed the follow up and strict return precautions for any new or worsening symptoms. Patient is aware of  symptoms that would be deemed urgent/emergent, and would thus require further evaluation either here or in the emergency department. At the time of discharge, he verbalized understanding and consent with the discharge plan as it was reviewed with him. All questions were fielded by provider and/or clinic staff prior to patient discharge.    Final Clinical Impressions / Urgent Care Diagnoses:   Final diagnoses:  Penile discharge, without blood  Routine screening for STI (sexually transmitted infection)  History of chlamydia  History of herpes genitalis    New Prescriptions:  [place] Controlled Substance Registry consulted? Not Applicable  Meds ordered this encounter  Medications   cefTRIAXone (ROCEPHIN) injection 250 mg   azithromycin (ZITHROMAX) tablet 1,000 mg    Recommended Follow up Care:  Patient encouraged to follow up with the following provider within the specified time frame, or sooner as dictated by the severity of his symptoms. As always, he was instructed that for any urgent/emergent care needs, he should seek care either here or in the emergency department for more immediate evaluation.  Follow-up Information    Towanda Malkin, MD In 1 week.   Specialty: Internal Medicine Why: General reassessment of symptoms if not improving Contact information: [place] [address] [phone]         NOTE: This note was prepared using Dragon dictation software along with smaller phrase technology. Despite my best ability to proofread, there is the potential that transcriptional errors may still occur from this process, and are completely unintentional.    Karen Kitchens, NP 07/04/19 1642

## 2019-07-04 NOTE — Discharge Instructions (Addendum)
It was very nice seeing you today in clinic. Thank you for entrusting me with your care.   Urine was negative for infection. No blood or protein. Let your regular doctor know. STD testing sent. You elected to proceed with prophylactic treatment today. Increase fluid intake as much as possible over the next few days. Practice safe sex. If testing comes back positive, please inform all sexual partners so that they may also be treated.   Make arrangements to follow up with your regular doctor in 1 week for re-evaluation if not improving. If your symptoms/condition worsens, please seek follow up care either here or in the ER. Please remember, our [facility] providers are "right here with you" when you need [place].   Again, it was my pleasure to take care of you today. Thank you for choosing our clinic. I hope that you start to feel better quickly.   Honor Loh, MSN, APRN, FNP-C, CEN Advanced Practice Provider [facility]

## 2019-07-04 NOTE — ED Triage Notes (Addendum)
Patient in today c/o urinary frequency and milky discharge from his penis x 3-4 days. Patient states that he is not really concerned with STDs. Patient does have genital herpes.

## 2019-07-04 NOTE — Progress Notes (Signed)
Records reviewed.  Patient has been seen several times in the last few months for proteinuria and hematuria.  Additionally patient has a history of B-cell lymphoma.  Patient also has a history of genital herpes for which she takes Valtrex.  I feel he needs a face-to-face evaluation to ensure no complicating features of his UTI.  Based on what you shared with me, I feel your condition warrants further evaluation and I recommend that you be seen for a face to face office visit.  Male bladder infections are not very common.  We worry about prostate or kidney conditions.  Also, per your record, you have had both protein and blood in your urine in the past.  The standard of care is to examine the abdomen and kidneys, and to do a urine and blood test to make sure that something more serious is not going on.  We recommend that you see a provider today.  If your doctor's office is closed [place] has the following Urgent Cares:    NOTE: If you entered your credit card information for this eVisit, you will not be charged. You may see a "hold" on your card for the $35 but that hold will drop off and you will not have a charge processed.   If you are having a true medical emergency please call 911.     For an urgent face to face visit, [facility] has four urgent care centers for your convenience:    NEW:  [facility] [address] [place], [place] [address] .  Monday - Friday 10 am - 6 pm    . [facility]    [phone]                  Get Driving Directions  [id number] [address], [place] [address] . 10 am to 8 pm Monday-Friday . 12 pm to 8 pm Saturday-Sunday   . [facility] at [place]                  Get Driving Directions  [id number] [address], [place], [place] [address] . 8 am to 8 pm Monday-Friday . 9 am to 6 pm Saturday . 11 am to 6 pm Sunday   . [facility]  Urgent Care at [place]                  Get Driving Directions   [address], [place] [address] . 8 am to 8 pm Monday-Friday . 8 am to 4 pm Saturday-Sunday    . [facility] at [place]                    Get Driving Directions  [id number]  [address] [place],  [address]  . Monday-Friday, 12 PM to 6 PM    Your e-visit answers were reviewed by a board certified advanced clinical practitioner to complete your personal care plan.  Thank you for using e-Visits.  Greater than 5 minutes, yet less than 10 minutes of time have been spent researching, coordinating, and implementing care for this patient today

## 2019-07-05 ENCOUNTER — Encounter: Payer: Self-pay | Admitting: Urgent Care

## 2019-07-05 LAB — GC/CHLAMYDIA PROBE AMP
Chlamydia trachomatis, NAA: NEGATIVE
Neisseria Gonorrhoeae by PCR: POSITIVE — AB

## 2019-07-06 ENCOUNTER — Telehealth: Payer: Self-pay | Admitting: Emergency Medicine

## 2019-07-06 DIAGNOSIS — S336XXA Sprain of sacroiliac joint, initial encounter: Secondary | ICD-10-CM | POA: Diagnosis not present

## 2019-07-06 DIAGNOSIS — M545 Low back pain: Secondary | ICD-10-CM | POA: Diagnosis not present

## 2019-07-06 DIAGNOSIS — M9904 Segmental and somatic dysfunction of sacral region: Secondary | ICD-10-CM | POA: Diagnosis not present

## 2019-07-06 DIAGNOSIS — M9903 Segmental and somatic dysfunction of lumbar region: Secondary | ICD-10-CM | POA: Diagnosis not present

## 2019-07-06 DIAGNOSIS — S233XXA Sprain of ligaments of thoracic spine, initial encounter: Secondary | ICD-10-CM | POA: Diagnosis not present

## 2019-07-06 DIAGNOSIS — M9907 Segmental and somatic dysfunction of upper extremity: Secondary | ICD-10-CM | POA: Diagnosis not present

## 2019-07-06 DIAGNOSIS — M9902 Segmental and somatic dysfunction of thoracic region: Secondary | ICD-10-CM | POA: Diagnosis not present

## 2019-07-06 DIAGNOSIS — M7581 Other shoulder lesions, right shoulder: Secondary | ICD-10-CM | POA: Diagnosis not present

## 2019-07-06 DIAGNOSIS — S39012A Strain of muscle, fascia and tendon of lower back, initial encounter: Secondary | ICD-10-CM | POA: Diagnosis not present

## 2019-07-06 DIAGNOSIS — M7918 Myalgia, other site: Secondary | ICD-10-CM | POA: Diagnosis not present

## 2019-07-06 NOTE — Telephone Encounter (Signed)
Test for gonorrhea was positive. This was treated at the urgent care visit with IM rocephin 250mg  and po zithromax 1g. Pt needs education to refrain from sexual intercourse for 7 days after treatment to give the medicine time to work. Sexual partners need to be notified and tested/treated. Condoms may reduce risk of reinfection. Recheck or followup with PCP for further evaluation if symptoms are not improving. [facility] notified.   Attempted to reach patient. No answer at this time. Voicemail left.

## 2019-07-11 DIAGNOSIS — M7581 Other shoulder lesions, right shoulder: Secondary | ICD-10-CM | POA: Diagnosis not present

## 2019-07-11 DIAGNOSIS — M7918 Myalgia, other site: Secondary | ICD-10-CM | POA: Diagnosis not present

## 2019-07-11 DIAGNOSIS — M9907 Segmental and somatic dysfunction of upper extremity: Secondary | ICD-10-CM | POA: Diagnosis not present

## 2019-07-11 DIAGNOSIS — S39012A Strain of muscle, fascia and tendon of lower back, initial encounter: Secondary | ICD-10-CM | POA: Diagnosis not present

## 2019-07-11 DIAGNOSIS — M9902 Segmental and somatic dysfunction of thoracic region: Secondary | ICD-10-CM | POA: Diagnosis not present

## 2019-07-11 DIAGNOSIS — S336XXA Sprain of sacroiliac joint, initial encounter: Secondary | ICD-10-CM | POA: Diagnosis not present

## 2019-07-11 DIAGNOSIS — M545 Low back pain: Secondary | ICD-10-CM | POA: Diagnosis not present

## 2019-07-11 DIAGNOSIS — M9904 Segmental and somatic dysfunction of sacral region: Secondary | ICD-10-CM | POA: Diagnosis not present

## 2019-07-11 DIAGNOSIS — S233XXA Sprain of ligaments of thoracic spine, initial encounter: Secondary | ICD-10-CM | POA: Diagnosis not present

## 2019-07-11 DIAGNOSIS — M9903 Segmental and somatic dysfunction of lumbar region: Secondary | ICD-10-CM | POA: Diagnosis not present

## 2019-07-31 ENCOUNTER — Encounter: Payer: Self-pay | Admitting: Occupational Medicine

## 2019-07-31 ENCOUNTER — Ambulatory Visit: Payer: 59 | Admitting: Occupational Medicine

## 2019-07-31 ENCOUNTER — Other Ambulatory Visit: Payer: Self-pay

## 2019-07-31 VITALS — BP 120/82 | HR 81 | Temp 97.3°F | Resp 12 | Ht 67.0 in | Wt 197.0 lb

## 2019-07-31 DIAGNOSIS — H9201 Otalgia, right ear: Secondary | ICD-10-CM

## 2019-07-31 MED ORDER — CLARITIN-D 12 HOUR 5-120 MG PO TB12: 1.0000 | ORAL_TABLET | Freq: Two times a day (BID) | ORAL | 0 refills | Status: AC

## 2019-07-31 MED ORDER — FLUTICASONE PROPIONATE 50 MCG/ACT NA SUSP: 2.0000 | Freq: Every day | NASAL | 0 refills | Status: DC

## 2019-07-31 MED ORDER — NEOMYCIN-POLYMYXIN-HC 3.5-10000-1 OT SOLN: 4.0000 [drp] | Freq: Four times a day (QID) | OTIC | 0 refills | Status: DC

## 2019-07-31 NOTE — Progress Notes (Signed)
Patient ID: Thomas Mckay DOB: January 12, 1986 AGE: 34 y.o. MRN: [id number]   PCP: No primary care provider on file.   Chief Complaint:  Chief Complaint  Patient presents with  . Ear Pain    Right  . Covid Screening    No Symptoms     Subjective:    HPI:  Thomas Mckay is a 34 y.o. male presents for evaluation  Chief Complaint  Patient presents with  . Ear Pain    Right  . Covid Screening    No Symptoms    34 year old male presents to [facility] with one week history of right ear pain. No precipitating injury/trauma. Describes as sensitive, sore, and pressure. Associated water filled sensation. Mild in severity. Not worsening or improving. No previous history of similar symptoms. Denies use of ear plugs. Denies recent swimming in [place], pool, hot tub etc. No history of cerumen impaction. Denies associated fever, chills, headache, tinnitus, dizziness/lightheadedness, muffled hearing, ear discharge/drainage, sinus pain, nasal congestion, rhinorrhea, sneezing, eye pruritis/erythema/watering, sore throat, cough. Patient has used OTC Benadryl and ibuprofen with no improvement. States when showering this morning, allowed a few drops of water to get in to right ear, was painful/uncomfortable. Patient does have seasonal allergies; no recent flare-up.  A limited review of symptoms was performed, pertinent positives and negatives as mentioned in HPI.  The following portions of the patient's history were reviewed and updated as appropriate: allergies, current medications and past medical history.  Patient Active Problem List   Diagnosis Date Noted  . Abdominal pain 02/22/2019  . Proteinuria 02/22/2019  . Asymptomatic microscopic hematuria 02/22/2019  . Pain in joint involving ankle and foot 02/01/2019  . Achilles bursitis 02/01/2019  . Achilles tendinitis 02/01/2019  . Hip pain 02/01/2019  . Lumbar sprain 02/01/2019  . Sprain of deltoid ligament of  ankle 02/01/2019  . Sprain of hand 02/01/2019  . Pre-bariatric surgery nutrition evaluation 06/03/2017  . Gastroesophageal reflux disease 05/24/2017  . Type 2 HSV infection of penis 05/24/2017  . 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 Shoulder joint pain 05/14/2017  . Non-Hodgkin lymphoma ([place]) 04/14/2017  . Bilateral carpal tunnel syndrome 01/28/2017  . Tear of right glenoid labrum 10/09/2014  . Diffuse large B-cell lymphoma of lymph nodes of neck ([place]) 08/02/2014  . History of antineoplastic chemotherapy 11/22/2012  . History of radiation therapy 11/22/2012  . Allergic rhinitis 08/18/2007    No Known Allergies  Current Outpatient Medications on File Prior to Visit  Medication Sig Dispense Refill  . PREVIDENT 5000 SENSITIVE 1.1-5 % PSTE BRUSH AND EXPECTORATE ONLY, DO NOT RINSE, 30 MIN PRIOR TO ANY FOOD OR DRINK    . valACYclovir (VALTREX) 500 MG tablet Take 500 mg daily by mouth.    . [DISCONTINUED] gabapentin (NEURONTIN) 300 MG capsule gabapentin 300 mg capsule    . [DISCONTINUED] omeprazole (PRILOSEC) 40 MG capsule Take 40 mg daily by mouth.     No current facility-administered medications on file prior to visit.       Objective:   Vitals:   07/31/19 1334  BP: 120/82  Pulse: 81  Resp: 12  Temp: (!) 97.3 F (36.3 C)  SpO2: 96%     Wt Readings from Last 3 Encounters:  07/31/19 197 lb (89.4 kg)  07/04/19 193 lb (87.5 kg)  02/22/19 194 lb (88 kg)    Physical Exam:   General Appearance:  Patient sitting comfortably on examination table. Conversational. Kermit Balo self-historian. In no acute distress.  Afebrile.   Head:  Normocephalic, without obvious abnormality, atraumatic  Eyes:  PERRL, conjunctiva/corneas clear, EOM's intact  Ears:  Left ear canal WNL. No erythema or edema. No open wound. No visible purulent drainage. No tenderness with palpation over left tragus or with manipulation of left auricle. No visible erythema or edema of left mastoid. No tenderness with  palpation over left mastoid. Right ear canal: Mild pain with insertion of otoscope. Scant amount of soft cerumen; no obstruction. No erythema or edema. No open wound. No visible purulent drainage. No tenderness with palpation over right tragus. Minimal discomfort with manipulation of auricle. No visible erythema or edema of right mastoid. No tenderness with palpation over right mastoid. Left TM WNL. Good light reflex. Visible landmarks. No erythema. No injection. No bulging or retraction. No visible perforation. No serous effusion. No visible purulent effusion. No tympanostomy tube. No scar tissue. Right TM WNL. Good light reflex. Visible landmarks. No erythema. No injection. No bulging or retraction. No visible perforation. No serous effusion. No visible purulent effusion. No tympanostomy tube. No scar tissue.  Nose: Nares normal. Septum midline. No visible polyps. No discharge. Normal mucosa; minimal bogginess. No sinus tenderness with percussion/palpation.  Throat: Lips, mucosa, and tongue normal; teeth and gums normal. Throat reveals no erythema. No postnasal drip. No visible cobblestoning. Tonsils with no enlargement or exudate. Uvula midline with no edema or erythema.  Neck: Supple, symmetrical, trachea midline, no adenopathy  Lungs:   Clear to auscultation bilaterally, respirations unlabored. Good aeration. No rales, rhonchi, crackles or wheezing.  Heart:  Regular rate and rhythm, S1 and S2 normal, no murmur, rub, or gallop  Extremities: Extremities normal, atraumatic, no cyanosis or edema  Pulses: 2+ and symmetric  Skin: Skin color, texture, turgor normal, no rashes or lesions  Lymph nodes: Cervical, supraclavicular, and axillary nodes normal  Neurologic: Normal    Assessment & Plan:    Exam findings, diagnosis etiology and medication use and indications reviewed with patient. Follow-Up and discharge instructions provided. No emergent/urgent issues found on exam.  Patient education was  provided.   Patient verbalized understanding of information provided and agrees with plan of care (POC), all questions answered. The patient is advised to call or return to clinic if condition does not see an improvement in symptoms, or to seek the care of the closest emergency department if condition worsens with the below plan.    1. Acute otalgia, right  - neomycin-polymyxin-hydrocortisone (CORTISPORIN) OTIC solution; Place 4 drops into the right ear 4 (four) times daily.  Dispense: 10 mL; Refill: 0 - loratadine-pseudoephedrine (CLARITIN-D 12 HOUR) 5-120 MG tablet; Take 1 tablet by mouth 2 (two) times daily for 7 days.  Dispense: 14 tablet; Refill: 0 - fluticasone (FLONASE) 50 MCG/ACT nasal spray; Place 2 sprays into both nostrils daily.  Dispense: 16 g; Refill: 0  Patient with one week history of right otalgia. Benign physical exam other than discomfort with insertion of otoscope. History of seasonal allergies and water filled sensation suggestive of ETD. Will treat patient with one week course of Cortisporin ear drops for possible otitis externa. Will treat patient with Claritin-D and Flonase for ETD. Advised patient f/u in one week if no improvement, sooner with any worsening symptoms. Patient agreed with plan.   Darlin Priestly, MHS, PA-C Montey Hora, MHS, PA-C Advanced Practice Provider [facility]  [place], [place] [address] (p): [phone] [email or web address]

## 2019-07-31 NOTE — Progress Notes (Signed)
Right ear pain x1 week.   Denies drainage.  Took some Benadryl along with ibuprofen - without relief.  AM

## 2019-07-31 NOTE — Patient Instructions (Signed)
Thank you for coming to [facility].  You have been diagnosed with acute right otalgia (right ear pain).  Possible eustachian tube dysfunction (fluid in the middle ear, behind your ear drum) AND/OR Otitis externa (skin infection of the ear canal).  1. Acute otalgia, right - neomycin-polymyxin-hydrocortisone (CORTISPORIN) OTIC solution; Place 4 drops into the right ear 4 (four) times daily.  Dispense: 10 mL; Refill: 0 - loratadine-pseudoephedrine (CLARITIN-D 12 HOUR) 5-120 MG tablet; Take 1 tablet by mouth 2 (two) times daily for 7 days.  Dispense: 14 tablet; Refill: 0 - fluticasone (FLONASE) 50 MCG/ACT nasal spray; Place 2 sprays into both nostrils daily.  Dispense: 16 g; Refill: 0  Use ear drops as prescribed. Use antihistamine with decongestant (Claritin-D) and Flonase nasal spray as prescribed.  May use over the counter Tylenol or ibuprofen for pain/discomfort.  Follow-up at the clinic in one week if symptoms not improving. Follow up immediately with worsening ear pain, ear discharge/drainage, fever, change in hearing, or other new/concerning symptom.  Hope you feel better soon!  Eustachian Tube Dysfunction  Eustachian tube dysfunction refers to a condition in which a blockage develops in the narrow passage that connects the middle ear to the back of the nose (eustachian tube). The eustachian tube regulates air pressure in the middle ear by letting air move between the ear and nose. It also helps to drain fluid from the middle ear space. Eustachian tube dysfunction can affect one or both ears. When the eustachian tube does not function properly, air pressure, fluid, or both can build up in the middle ear. What are the causes? This condition occurs when the eustachian tube becomes blocked or cannot open normally. Common causes of this condition include:  Ear infections.  Colds and other infections that affect the nose, mouth, and throat (upper  respiratory tract).  Allergies.  Irritation from cigarette smoke.  Irritation from stomach acid coming up into the esophagus (gastroesophageal reflux). The esophagus is the tube that carries food from the mouth to the stomach.  Sudden changes in air pressure, such as from descending in an airplane or scuba diving.  Abnormal growths in the nose or throat, such as: ? Growths that line the nose (nasal polyps). ? Abnormal growth of cells (tumors). ? Enlarged tissue at the back of the throat (adenoids). What increases the risk? You are more likely to develop this condition if:  You smoke.  You are overweight.  You are a child who has: ? Certain birth defects of the mouth, such as cleft palate. ? Large tonsils or adenoids. What are the signs or symptoms? Common symptoms of this condition include:  A feeling of fullness in the ear.  Ear pain.  Clicking or popping noises in the ear.  Ringing in the ear.  Hearing loss.  Loss of balance.  Dizziness. Symptoms may get worse when the air pressure around you changes, such as when you travel to an area of high elevation, fly on an airplane, or go scuba diving. How is this diagnosed? This condition may be diagnosed based on:  Your symptoms.  A physical exam of your ears, nose, and throat.  Tests, such as those that measure: ? The movement of your eardrum (tympanogram). ? Your hearing (audiometry). How is this treated? Treatment depends on the cause and severity of your condition.  In mild cases, you may relieve your symptoms by moving air into your ears. This is called "popping the ears."  In more severe cases,  or if you have symptoms of fluid in your ears, treatment may include: ? Medicines to relieve congestion (decongestants). ? Medicines that treat allergies (antihistamines). ? Nasal sprays or ear drops that contain medicines that reduce swelling (steroids). ? A procedure to drain the fluid in your eardrum  (myringotomy). In this procedure, a small tube is placed in the eardrum to:  Drain the fluid.  Restore the air in the middle ear space. ? A procedure to insert a balloon device through the nose to inflate the opening of the eustachian tube (balloon dilation). Follow these instructions at home: Lifestyle  Do not do any of the following until your health care provider approves: ? Travel to high altitudes. ? Fly in airplanes. ? Work in a [employer] scheme manager or room. ? Scuba dive.  Do not use any products that contain nicotine or tobacco, such as cigarettes and e-cigarettes. If you need help quitting, ask your health care provider.  Keep your ears dry. Wear fitted earplugs during showering and bathing. Dry your ears completely after. General instructions  Take over-the-counter and prescription medicines only as told by your health care provider.  Use techniques to help pop your ears as recommended by your health care provider. These may include: ? Chewing gum. ? Yawning. ? Frequent, forceful swallowing. ? Closing your mouth, holding your nose closed, and gently blowing as if you are trying to blow air out of your nose.  Keep all follow-up visits as told by your health care provider. This is important. Contact a health care provider if:  Your symptoms do not go away after treatment.  Your symptoms come back after treatment.  You are unable to pop your ears.  You have: ? A fever. ? Pain in your ear. ? Pain in your head or neck. ? Fluid draining from your ear.  Your hearing suddenly changes.  You become very dizzy.  You lose your balance. Summary  Eustachian tube dysfunction refers to a condition in which a blockage develops in the eustachian tube.  It can be caused by ear infections, allergies, inhaled irritants, or abnormal growths in the nose or throat.  Symptoms include ear pain, hearing loss, or ringing in the ears.  Mild cases are treated with maneuvers to unblock  the ears, such as yawning or ear popping.  Severe cases are treated with medicines. Surgery may also be done (rare). This information is not intended to replace advice given to you by your health care provider. Make sure you discuss any questions you have with your health care provider. Document Revised: 10/26/2017 Document Reviewed: 10/26/2017 Elsevier Patient Education  [employer].

## 2019-08-06 ENCOUNTER — Other Ambulatory Visit: Payer: Self-pay

## 2019-08-06 ENCOUNTER — Ambulatory Visit
Admission: EM | Admit: 2019-08-06 | Discharge: 2019-08-06 | Disposition: A | Discharge status: Home / Self Care | Payer: Managed Care, Other (non HMO) | Attending: Family Medicine | Admitting: Family Medicine

## 2019-08-06 ENCOUNTER — Encounter: Payer: Self-pay | Admitting: Emergency Medicine

## 2019-08-06 DIAGNOSIS — N390 Urinary tract infection, site not specified: Secondary | ICD-10-CM | POA: Diagnosis not present

## 2019-08-06 LAB — URINALYSIS, COMPLETE (UACMP) WITH MICROSCOPIC
Bilirubin Urine: NEGATIVE
Glucose, UA: NEGATIVE mg/dL
Ketones, ur: NEGATIVE mg/dL
Leukocytes,Ua: NEGATIVE
Nitrite: NEGATIVE
Protein, ur: NEGATIVE mg/dL
Specific Gravity, Urine: 1.015 (ref 1.005–1.030)
pH: 7 (ref 5.0–8.0)

## 2019-08-06 MED ORDER — SULFAMETHOXAZOLE-TRIMETHOPRIM 800-160 MG PO TABS: 1.0000 | ORAL_TABLET | Freq: Two times a day (BID) | ORAL | 0 refills | Status: DC

## 2019-08-06 NOTE — ED Triage Notes (Signed)
Patient was seen in 07/04/19 for STD.  Patient states that he has had urinary frequency since then but no penile discharge.

## 2019-08-06 NOTE — Discharge Instructions (Signed)
Increase water intake Await other test results

## 2019-08-07 LAB — URINE CULTURE
Culture: NO GROWTH
Special Requests: NORMAL

## 2019-08-08 LAB — GC/CHLAMYDIA PROBE AMP
Chlamydia trachomatis, NAA: NEGATIVE
Neisseria Gonorrhoeae by PCR: NEGATIVE

## 2019-08-11 NOTE — ED Provider Notes (Signed)
[facility]    CSN: [id number] Arrival date & time: 08/06/19  1530      History   Chief Complaint Chief Complaint  Patient presents with  . Urinary Frequency    HPI Thomas Mckay is a 34 y.o. male.   34 yo male with a c/o frequent urination for the past several weeks but more noticeable this week. Denies any dysuria, penile discharge, hematuria, external genital rash, fevers, chills, back pain, abdominal pain. States last month he was treated for STD ([facility]).    Urinary Frequency    Past Medical History:  Diagnosis Date  . Arthritis    wrists  . History of chlamydia   . History of herpes genitalis   . Non Hodgkin's lymphoma ([place])    in remission. completed treatments 08/2012  . Seizures ([place])    x1. as infant    Patient Active Problem List   Diagnosis Date Noted  . Abdominal pain 02/22/2019  . Proteinuria 02/22/2019  . Asymptomatic microscopic hematuria 02/22/2019  . Pain in joint involving ankle and foot 02/01/2019  . Achilles bursitis 02/01/2019  . Achilles tendinitis 02/01/2019  . Hip pain 02/01/2019  . Lumbar sprain 02/01/2019  . Sprain of deltoid ligament of ankle 02/01/2019  . Sprain of hand 02/01/2019  . Pre-bariatric surgery nutrition evaluation 06/03/2017  . Gastroesophageal reflux disease 05/24/2017  . Type 2 HSV infection of penis 05/24/2017  . 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 Shoulder joint pain 05/14/2017  . Non-Hodgkin lymphoma ([place]) 04/14/2017  . Bilateral carpal tunnel syndrome 01/28/2017  . Tear of right glenoid labrum 10/09/2014  . Diffuse large B-cell lymphoma of lymph nodes of neck ([place]) 08/02/2014  . History of antineoplastic chemotherapy 11/22/2012  . History of radiation therapy 11/22/2012  . Allergic rhinitis 08/18/2007    Past Surgical History:  Procedure Laterality Date  . LYMPH NODE BIOPSY    . SHOULDER ARTHROSCOPY WITH LABRAL REPAIR Right 06/14/2017   Procedure: SHOULDER ARTHROSCOPY WITH LABRAL REPAIR and  subacromial debridement.;  Surgeon: Thornton Park, MD;  Location: [place];  Service: Orthopedics;  Laterality: Right;  . WISDOM TOOTH EXTRACTION         Home Medications    Prior to Admission medications   Medication Sig Start Date End Date Taking? Authorizing Provider  fluticasone (FLONASE) 50 MCG/ACT nasal spray Place 2 sprays into both nostrils daily. 07/31/19  Yes Darlin Priestly, PA-C  neomycin-polymyxin-hydrocortisone (CORTISPORIN) OTIC solution Place 4 drops into the right ear 4 (four) times daily. 07/31/19   Darlin Priestly, PA-C  PREVIDENT 5000 SENSITIVE 1.1-5 % PSTE BRUSH AND EXPECTORATE ONLY, DO NOT RINSE, 30 MIN PRIOR TO ANY FOOD OR DRINK 02/14/19   [provider]  sulfamethoxazole-trimethoprim (BACTRIM DS) 800-160 MG tablet Take 1 tablet by mouth 2 (two) times daily. 08/06/19   Norval Gable, MD  valACYclovir (VALTREX) 500 MG tablet Take 500 mg daily by mouth.    [provider]  gabapentin (NEURONTIN) 300 MG capsule gabapentin 300 mg capsule  07/04/19  [provider]  omeprazole (PRILOSEC) 40 MG capsule Take 40 mg daily by mouth.  07/04/19  [provider]    Family History Family History  Problem Relation Age of Onset  . Asthma Mother   . Hypertension Father   . Asthma Brother     Social History Social History   Tobacco Use  . Smoking status: Never Smoker  . Smokeless tobacco: Never Used  Substance Use Topics  . Alcohol use: Yes  Comment: rare - Holidays  . Drug use: Never     Allergies   Patient has no known allergies.   Review of Systems Review of Systems  Genitourinary: Positive for frequency.     Physical Exam Triage Vital Signs ED Triage Vitals  Enc Vitals Group     BP 08/06/19 1545 134/84     Pulse Rate 08/06/19 1545 78     Resp 08/06/19 1545 16     Temp 08/06/19 1545 98.3 F (36.8 C)     Temp Source 08/06/19 1545 Oral     SpO2 08/06/19 1545 97 %     Weight 08/06/19 1544 195 lb  (88.5 kg)     Height 08/06/19 1544 5\' 7"  (1.702 m)     Head Circumference --      Peak Flow --      Pain Score 08/06/19 1544 0     Pain Loc --      Pain Edu? --      Excl. in [place]? --    No data found.  Updated Vital Signs BP 134/84 (BP Location: Right Arm)   Pulse 78   Temp 98.3 F (36.8 C) (Oral)   Resp 16   Ht 5\' 7"  (1.702 m)   Wt 88.5 kg   SpO2 97%   BMI 30.54 kg/m   Visual Acuity Right Eye Distance:   Left Eye Distance:   Bilateral Distance:    Right Eye Near:   Left Eye Near:    Bilateral Near:     Physical Exam Vitals and nursing note reviewed.  Constitutional:      General: He is not in acute distress.    Appearance: He is not toxic-appearing or diaphoretic.  Cardiovascular:     Rate and Rhythm: Normal rate.  Neurological:     Mental Status: He is alert.      UC Treatments / Results  Labs (all labs ordered are listed, but only abnormal results are displayed) Labs Reviewed  URINALYSIS, COMPLETE (UACMP) WITH MICROSCOPIC - Abnormal; Notable for the following components:      Result Value   Hgb urine dipstick TRACE (*)    Bacteria, UA RARE (*)    All other components within normal limits  GC/CHLAMYDIA PROBE AMP  URINE CULTURE    EKG   Radiology No results found.  Procedures Procedures (including critical care time)  Medications Ordered in UC Medications - No data to display  Initial Impression / Assessment and Plan / UC Course  I have reviewed the triage vital signs and the nursing notes.  Pertinent labs & imaging results that were available during my care of the patient were reviewed by me and considered in my medical decision making (see chart for details).      Final Clinical Impressions(s) / UC Diagnoses   Final diagnoses:  Lower urinary tract infectious disease     Discharge Instructions     Increase water intake Await other test results    ED Prescriptions    Medication Sig Dispense Auth. Provider    sulfamethoxazole-trimethoprim (BACTRIM DS) 800-160 MG tablet Take 1 tablet by mouth 2 (two) times daily. 14 tablet Norval Gable, MD      1. Lab results and diagnosis reviewed with patient 2. rx as per orders above; reviewed possible side effects, interactions, risks and benefits  3. Recommend supportive treatment as above 4. Follow-up prn if symptoms worsen or don't improve   PDMP not reviewed this encounter.   [place], [place],  MD 08/11/19 1643

## 2019-08-22 ENCOUNTER — Other Ambulatory Visit: Payer: Self-pay | Admitting: Physician Assistant

## 2019-08-22 DIAGNOSIS — H9201 Otalgia, right ear: Secondary | ICD-10-CM

## 2019-08-31 DIAGNOSIS — S39012A Strain of muscle, fascia and tendon of lower back, initial encounter: Secondary | ICD-10-CM | POA: Diagnosis not present

## 2019-08-31 DIAGNOSIS — M9907 Segmental and somatic dysfunction of upper extremity: Secondary | ICD-10-CM | POA: Diagnosis not present

## 2019-08-31 DIAGNOSIS — M9903 Segmental and somatic dysfunction of lumbar region: Secondary | ICD-10-CM | POA: Diagnosis not present

## 2019-08-31 DIAGNOSIS — M9902 Segmental and somatic dysfunction of thoracic region: Secondary | ICD-10-CM | POA: Diagnosis not present

## 2019-08-31 DIAGNOSIS — M9904 Segmental and somatic dysfunction of sacral region: Secondary | ICD-10-CM | POA: Diagnosis not present

## 2019-08-31 DIAGNOSIS — S336XXA Sprain of sacroiliac joint, initial encounter: Secondary | ICD-10-CM | POA: Diagnosis not present

## 2019-08-31 DIAGNOSIS — M545 Low back pain: Secondary | ICD-10-CM | POA: Diagnosis not present

## 2019-08-31 DIAGNOSIS — M7918 Myalgia, other site: Secondary | ICD-10-CM | POA: Diagnosis not present

## 2019-08-31 DIAGNOSIS — S233XXA Sprain of ligaments of thoracic spine, initial encounter: Secondary | ICD-10-CM | POA: Diagnosis not present

## 2019-08-31 DIAGNOSIS — M7581 Other shoulder lesions, right shoulder: Secondary | ICD-10-CM | POA: Diagnosis not present

## 2019-09-07 DIAGNOSIS — M9902 Segmental and somatic dysfunction of thoracic region: Secondary | ICD-10-CM | POA: Diagnosis not present

## 2019-09-07 DIAGNOSIS — M9907 Segmental and somatic dysfunction of upper extremity: Secondary | ICD-10-CM | POA: Diagnosis not present

## 2019-09-07 DIAGNOSIS — M7918 Myalgia, other site: Secondary | ICD-10-CM | POA: Diagnosis not present

## 2019-09-07 DIAGNOSIS — M9904 Segmental and somatic dysfunction of sacral region: Secondary | ICD-10-CM | POA: Diagnosis not present

## 2019-09-07 DIAGNOSIS — S39012A Strain of muscle, fascia and tendon of lower back, initial encounter: Secondary | ICD-10-CM | POA: Diagnosis not present

## 2019-09-07 DIAGNOSIS — M7581 Other shoulder lesions, right shoulder: Secondary | ICD-10-CM | POA: Diagnosis not present

## 2019-09-07 DIAGNOSIS — M9903 Segmental and somatic dysfunction of lumbar region: Secondary | ICD-10-CM | POA: Diagnosis not present

## 2019-09-07 DIAGNOSIS — S233XXA Sprain of ligaments of thoracic spine, initial encounter: Secondary | ICD-10-CM | POA: Diagnosis not present

## 2019-09-07 DIAGNOSIS — M545 Low back pain: Secondary | ICD-10-CM | POA: Diagnosis not present

## 2019-09-07 DIAGNOSIS — S336XXA Sprain of sacroiliac joint, initial encounter: Secondary | ICD-10-CM | POA: Diagnosis not present

## 2019-09-14 DIAGNOSIS — M545 Low back pain: Secondary | ICD-10-CM | POA: Diagnosis not present

## 2019-09-14 DIAGNOSIS — M9903 Segmental and somatic dysfunction of lumbar region: Secondary | ICD-10-CM | POA: Diagnosis not present

## 2019-09-14 DIAGNOSIS — M7918 Myalgia, other site: Secondary | ICD-10-CM | POA: Diagnosis not present

## 2019-09-14 DIAGNOSIS — M7581 Other shoulder lesions, right shoulder: Secondary | ICD-10-CM | POA: Diagnosis not present

## 2019-09-14 DIAGNOSIS — M9902 Segmental and somatic dysfunction of thoracic region: Secondary | ICD-10-CM | POA: Diagnosis not present

## 2019-09-14 DIAGNOSIS — M546 Pain in thoracic spine: Secondary | ICD-10-CM | POA: Diagnosis not present

## 2019-09-14 DIAGNOSIS — M9904 Segmental and somatic dysfunction of sacral region: Secondary | ICD-10-CM | POA: Diagnosis not present

## 2019-09-14 DIAGNOSIS — M25551 Pain in right hip: Secondary | ICD-10-CM | POA: Diagnosis not present

## 2019-09-14 DIAGNOSIS — M9907 Segmental and somatic dysfunction of upper extremity: Secondary | ICD-10-CM | POA: Diagnosis not present

## 2019-09-14 DIAGNOSIS — M9906 Segmental and somatic dysfunction of lower extremity: Secondary | ICD-10-CM | POA: Diagnosis not present

## 2019-09-21 DIAGNOSIS — M9902 Segmental and somatic dysfunction of thoracic region: Secondary | ICD-10-CM | POA: Diagnosis not present

## 2019-09-21 DIAGNOSIS — M7918 Myalgia, other site: Secondary | ICD-10-CM | POA: Diagnosis not present

## 2019-09-21 DIAGNOSIS — M9907 Segmental and somatic dysfunction of upper extremity: Secondary | ICD-10-CM | POA: Diagnosis not present

## 2019-09-21 DIAGNOSIS — M9904 Segmental and somatic dysfunction of sacral region: Secondary | ICD-10-CM | POA: Diagnosis not present

## 2019-09-21 DIAGNOSIS — M7581 Other shoulder lesions, right shoulder: Secondary | ICD-10-CM | POA: Diagnosis not present

## 2019-09-21 DIAGNOSIS — M9903 Segmental and somatic dysfunction of lumbar region: Secondary | ICD-10-CM | POA: Diagnosis not present

## 2019-09-21 DIAGNOSIS — M9906 Segmental and somatic dysfunction of lower extremity: Secondary | ICD-10-CM | POA: Diagnosis not present

## 2019-09-21 DIAGNOSIS — M545 Low back pain: Secondary | ICD-10-CM | POA: Diagnosis not present

## 2019-09-21 DIAGNOSIS — M546 Pain in thoracic spine: Secondary | ICD-10-CM | POA: Diagnosis not present

## 2019-09-21 DIAGNOSIS — M25551 Pain in right hip: Secondary | ICD-10-CM | POA: Diagnosis not present

## 2019-10-05 DIAGNOSIS — M9903 Segmental and somatic dysfunction of lumbar region: Secondary | ICD-10-CM | POA: Diagnosis not present

## 2019-10-05 DIAGNOSIS — M9906 Segmental and somatic dysfunction of lower extremity: Secondary | ICD-10-CM | POA: Diagnosis not present

## 2019-10-05 DIAGNOSIS — M7581 Other shoulder lesions, right shoulder: Secondary | ICD-10-CM | POA: Diagnosis not present

## 2019-10-05 DIAGNOSIS — M7918 Myalgia, other site: Secondary | ICD-10-CM | POA: Diagnosis not present

## 2019-10-05 DIAGNOSIS — M9907 Segmental and somatic dysfunction of upper extremity: Secondary | ICD-10-CM | POA: Diagnosis not present

## 2019-10-05 DIAGNOSIS — M546 Pain in thoracic spine: Secondary | ICD-10-CM | POA: Diagnosis not present

## 2019-10-05 DIAGNOSIS — M545 Low back pain: Secondary | ICD-10-CM | POA: Diagnosis not present

## 2019-10-05 DIAGNOSIS — M25551 Pain in right hip: Secondary | ICD-10-CM | POA: Diagnosis not present

## 2019-10-05 DIAGNOSIS — M9902 Segmental and somatic dysfunction of thoracic region: Secondary | ICD-10-CM | POA: Diagnosis not present

## 2019-10-05 DIAGNOSIS — M9904 Segmental and somatic dysfunction of sacral region: Secondary | ICD-10-CM | POA: Diagnosis not present

## 2019-11-03 ENCOUNTER — Other Ambulatory Visit: Payer: Self-pay

## 2019-11-03 DIAGNOSIS — H9201 Otalgia, right ear: Secondary | ICD-10-CM

## 2019-11-03 MED ORDER — FLUTICASONE PROPIONATE 50 MCG/ACT NA SUSP: 2.0000 | Freq: Every day | NASAL | 2 refills | Status: DC

## 2019-11-20 DIAGNOSIS — M7581 Other shoulder lesions, right shoulder: Secondary | ICD-10-CM | POA: Diagnosis not present

## 2019-11-20 DIAGNOSIS — M546 Pain in thoracic spine: Secondary | ICD-10-CM | POA: Diagnosis not present

## 2019-11-20 DIAGNOSIS — M9906 Segmental and somatic dysfunction of lower extremity: Secondary | ICD-10-CM | POA: Diagnosis not present

## 2019-11-20 DIAGNOSIS — M7918 Myalgia, other site: Secondary | ICD-10-CM | POA: Diagnosis not present

## 2019-11-20 DIAGNOSIS — M9904 Segmental and somatic dysfunction of sacral region: Secondary | ICD-10-CM | POA: Diagnosis not present

## 2019-11-20 DIAGNOSIS — M9907 Segmental and somatic dysfunction of upper extremity: Secondary | ICD-10-CM | POA: Diagnosis not present

## 2019-11-20 DIAGNOSIS — M545 Low back pain: Secondary | ICD-10-CM | POA: Diagnosis not present

## 2019-11-20 DIAGNOSIS — M9903 Segmental and somatic dysfunction of lumbar region: Secondary | ICD-10-CM | POA: Diagnosis not present

## 2019-11-20 DIAGNOSIS — M25551 Pain in right hip: Secondary | ICD-10-CM | POA: Diagnosis not present

## 2019-11-20 DIAGNOSIS — M9902 Segmental and somatic dysfunction of thoracic region: Secondary | ICD-10-CM | POA: Diagnosis not present

## 2019-11-27 DIAGNOSIS — M7918 Myalgia, other site: Secondary | ICD-10-CM | POA: Diagnosis not present

## 2019-11-27 DIAGNOSIS — M546 Pain in thoracic spine: Secondary | ICD-10-CM | POA: Diagnosis not present

## 2019-11-27 DIAGNOSIS — M9902 Segmental and somatic dysfunction of thoracic region: Secondary | ICD-10-CM | POA: Diagnosis not present

## 2019-11-27 DIAGNOSIS — M545 Low back pain: Secondary | ICD-10-CM | POA: Diagnosis not present

## 2019-11-27 DIAGNOSIS — M7581 Other shoulder lesions, right shoulder: Secondary | ICD-10-CM | POA: Diagnosis not present

## 2019-11-27 DIAGNOSIS — M9907 Segmental and somatic dysfunction of upper extremity: Secondary | ICD-10-CM | POA: Diagnosis not present

## 2019-11-27 DIAGNOSIS — M9906 Segmental and somatic dysfunction of lower extremity: Secondary | ICD-10-CM | POA: Diagnosis not present

## 2019-11-27 DIAGNOSIS — M25551 Pain in right hip: Secondary | ICD-10-CM | POA: Diagnosis not present

## 2019-11-27 DIAGNOSIS — M9903 Segmental and somatic dysfunction of lumbar region: Secondary | ICD-10-CM | POA: Diagnosis not present

## 2019-11-27 DIAGNOSIS — M9904 Segmental and somatic dysfunction of sacral region: Secondary | ICD-10-CM | POA: Diagnosis not present

## 2019-12-01 ENCOUNTER — Other Ambulatory Visit: Payer: Self-pay

## 2019-12-01 ENCOUNTER — Ambulatory Visit: Payer: Self-pay

## 2019-12-01 DIAGNOSIS — Z0184 Encounter for antibody response examination: Secondary | ICD-10-CM

## 2019-12-01 DIAGNOSIS — Z0000 Encounter for general adult medical examination without abnormal findings: Secondary | ICD-10-CM

## 2019-12-01 LAB — POCT URINALYSIS DIPSTICK
Bilirubin, UA: NEGATIVE
Blood, UA: POSITIVE
Glucose, UA: NEGATIVE
Ketones, UA: NEGATIVE
Leukocytes, UA: NEGATIVE
Nitrite, UA: NEGATIVE
Protein, UA: NEGATIVE
Spec Grav, UA: 1.02 (ref 1.010–1.025)
Urobilinogen, UA: 0.2 E.U./dL
pH, UA: 6 (ref 5.0–8.0)

## 2019-12-01 NOTE — Progress Notes (Signed)
Scheduled to complete physical 12/11/19 with Randel Pigg, PA-C.

## 2019-12-06 DIAGNOSIS — Z006 Encounter for examination for normal comparison and control in clinical research program: Secondary | ICD-10-CM | POA: Insufficient documentation

## 2019-12-11 ENCOUNTER — Encounter: Payer: Self-pay | Admitting: Physician Assistant

## 2019-12-11 ENCOUNTER — Ambulatory Visit: Payer: Self-pay | Admitting: Physician Assistant

## 2019-12-11 ENCOUNTER — Other Ambulatory Visit: Payer: Self-pay

## 2019-12-11 VITALS — BP 133/88 | HR 77 | Temp 97.9°F | Resp 14 | Ht 68.0 in | Wt 189.0 lb

## 2019-12-11 DIAGNOSIS — Z0000 Encounter for general adult medical examination without abnormal findings: Secondary | ICD-10-CM

## 2019-12-11 DIAGNOSIS — R1033 Periumbilical pain: Secondary | ICD-10-CM

## 2019-12-11 DIAGNOSIS — G43001 Migraine without aura, not intractable, with status migrainosus: Secondary | ICD-10-CM

## 2019-12-11 MED ORDER — SUMATRIPTAN SUCCINATE 50 MG PO TABS: 50.0000 mg | ORAL_TABLET | ORAL | 0 refills | Status: DC | PRN

## 2019-12-11 NOTE — Progress Notes (Signed)
   Subjective: Annual physical exam    Patient ID: Thomas Mckay, male    DOB: [birth year], 34 y.o.   MRN: [id number]  HPI Patient presents for annual physical exam.  Patient was concerned for 2 weeks of left frontal headache.  Patient states headache associated with photophobia.  Denies nausea, vertigo, or weakness.  No previous history of migraine headaches.  Patient had a severe head injury 2 years ago and was put on gabapentin for 1 years due to seizure-like activities.  Patient has not taken gabapentin in over a year.  Review of Systems    Headaches and umbilical hernia Objective:   Physical Exam Patient no acute distress.  HEENT is unremarkable.  Neck is supple for adenopathy or bruits.  Lungs are clear to auscultation.  Heart regular rate and rhythm.  Abdomen negative HSM, normoactive bowel sounds, soft, and no guarding with palpation.  Mild guarding with palpation inferior umbilicus.  No palpable mass.  No obvious deformity to upper or lower extremities.  Patient has full neck range of motion upper and lower extremity.  No obvious deformity to cervical lumbar spine.  Patient has full neck range of motion cervical lumbar spine.  Cranial nerves II through XII are grossly intact.     Assessment & Plan: Well exam  Discussed with patient treatment options for migraine type headache.  Patient will be given a prescription for Imitrex and advised to keep a headache log and follow-up in 2 weeks.  Discussed the option of consult with neurologist due to history of head injury.  Based on ultrasound and physical exam advised conservative supportive care for umbilicus issue.

## 2019-12-13 DIAGNOSIS — M545 Low back pain: Secondary | ICD-10-CM | POA: Diagnosis not present

## 2019-12-13 DIAGNOSIS — M9903 Segmental and somatic dysfunction of lumbar region: Secondary | ICD-10-CM | POA: Diagnosis not present

## 2019-12-13 DIAGNOSIS — M9902 Segmental and somatic dysfunction of thoracic region: Secondary | ICD-10-CM | POA: Diagnosis not present

## 2019-12-13 DIAGNOSIS — M9904 Segmental and somatic dysfunction of sacral region: Secondary | ICD-10-CM | POA: Diagnosis not present

## 2019-12-13 DIAGNOSIS — M7918 Myalgia, other site: Secondary | ICD-10-CM | POA: Diagnosis not present

## 2019-12-13 DIAGNOSIS — M546 Pain in thoracic spine: Secondary | ICD-10-CM | POA: Diagnosis not present

## 2019-12-21 ENCOUNTER — Other Ambulatory Visit: Payer: Self-pay

## 2019-12-21 ENCOUNTER — Encounter: Payer: Self-pay | Admitting: Physician Assistant

## 2019-12-21 ENCOUNTER — Ambulatory Visit: Payer: Self-pay | Admitting: Physician Assistant

## 2019-12-21 VITALS — BP 122/80 | HR 73 | Temp 98.2°F | Resp 16 | Ht 68.0 in | Wt 190.0 lb

## 2019-12-21 DIAGNOSIS — G43001 Migraine without aura, not intractable, with status migrainosus: Secondary | ICD-10-CM

## 2019-12-21 NOTE — Progress Notes (Signed)
° °  Subjective: Migraine headache    Patient ID: Thomas Mckay, male    DOB: [birth year], 34 y.o.   MRN: [id number]  HPI Patient returns for reevaluation for migraine headaches.  Patient state very good relief taking Imitrex.  Patient states since take the medication he has had reduce headaches and did not need to take more than 1 tablet per episode.  Patient stated the past 2 weeks as well exam he has had 3 migraine headaches requiring medication.  Patient state he normally take a Tylenol before considering taking Imitrex.  Patient state continues to be photosensitive with and without headaches.   Review of Systems    Migraine headache Objective:   Physical Exam No acute distress.  HEENT was unremarkable.  Patient was not not photosensitive with today's exam.       Assessment & Plan: Migraine headache  Advised continue headache log and take medication as directed.  Follow-up as necessary.

## 2019-12-22 LAB — CMP12+LP+TP+TSH+6AC+CBC/D/PLT
ALT: 34 IU/L (ref 0–44)
AST: 28 IU/L (ref 0–40)
Albumin/Globulin Ratio: 1.6 (ref 1.2–2.2)
Albumin: 4.2 g/dL (ref 4.0–5.0)
Alkaline Phosphatase: 84 IU/L (ref 39–117)
BUN/Creatinine Ratio: 6 — ABNORMAL LOW (ref 9–20)
BUN: 6 mg/dL (ref 6–20)
Basophils Absolute: 0 10*3/uL (ref 0.0–0.2)
Basos: 1 %
Bilirubin Total: 1.3 mg/dL — ABNORMAL HIGH (ref 0.0–1.2)
Calcium: 8.7 mg/dL (ref 8.7–10.2)
Chloride: 106 mmol/L (ref 96–106)
Chol/HDL Ratio: 3.7 ratio (ref 0.0–5.0)
Cholesterol, Total: 123 mg/dL (ref 100–199)
Creatinine, Ser: 0.94 mg/dL (ref 0.76–1.27)
EOS (ABSOLUTE): 0.2 10*3/uL (ref 0.0–0.4)
Eos: 4 %
Estimated CHD Risk: 0.6 times avg. (ref 0.0–1.0)
Free Thyroxine Index: 1.7 (ref 1.2–4.9)
GFR calc Af Amer: 124 mL/min/{1.73_m2} (ref >59)
GFR calc non Af Amer: 107 mL/min/{1.73_m2} (ref >59)
GGT: 27 IU/L (ref 0–65)
Globulin, Total: 2.7 g/dL (ref 1.5–4.5)
Glucose: 89 mg/dL (ref 65–99)
HDL: 33 mg/dL — ABNORMAL LOW (ref >39)
Hematocrit: 44.2 % (ref 37.5–51.0)
Hemoglobin: 15 g/dL (ref 13.0–17.7)
Immature Grans (Abs): 0 10*3/uL (ref 0.0–0.1)
Immature Granulocytes: 1 %
Iron: 97 ug/dL (ref 38–169)
LDH: 176 IU/L (ref 121–224)
LDL Chol Calc (NIH): 74 mg/dL (ref 0–99)
Lymphocytes Absolute: 1.6 10*3/uL (ref 0.7–3.1)
Lymphs: 40 %
MCH: 28.7 pg (ref 26.6–33.0)
MCHC: 33.9 g/dL (ref 31.5–35.7)
MCV: 85 fL (ref 79–97)
Monocytes Absolute: 0.3 10*3/uL (ref 0.1–0.9)
Monocytes: 8 %
Neutrophils Absolute: 1.9 10*3/uL (ref 1.4–7.0)
Neutrophils: 46 %
Phosphorus: 2.8 mg/dL (ref 2.8–4.1)
Platelets: 151 10*3/uL (ref 150–450)
Potassium: 4.3 mmol/L (ref 3.5–5.2)
RBC: 5.22 x10E6/uL (ref 4.14–5.80)
RDW: 12.1 % (ref 11.6–15.4)
Sodium: 143 mmol/L (ref 134–144)
T3 Uptake Ratio: 25 % (ref 24–39)
T4, Total: 6.7 ug/dL (ref 4.5–12.0)
TSH: 1.8 u[IU]/mL (ref 0.450–4.500)
Total Protein: 6.9 g/dL (ref 6.0–8.5)
Triglycerides: 77 mg/dL (ref 0–149)
Uric Acid: 6.3 mg/dL (ref 3.8–8.4)
VLDL Cholesterol Cal: 16 mg/dL (ref 5–40)
WBC: 4 10*3/uL (ref 3.4–10.8)

## 2019-12-22 LAB — RABIES NEUT.ABS TITRAT.(RFFIT)

## 2019-12-27 DIAGNOSIS — M9902 Segmental and somatic dysfunction of thoracic region: Secondary | ICD-10-CM | POA: Diagnosis not present

## 2019-12-27 DIAGNOSIS — M546 Pain in thoracic spine: Secondary | ICD-10-CM | POA: Diagnosis not present

## 2019-12-27 DIAGNOSIS — M7918 Myalgia, other site: Secondary | ICD-10-CM | POA: Diagnosis not present

## 2019-12-27 DIAGNOSIS — M545 Low back pain: Secondary | ICD-10-CM | POA: Diagnosis not present

## 2019-12-27 DIAGNOSIS — M9903 Segmental and somatic dysfunction of lumbar region: Secondary | ICD-10-CM | POA: Diagnosis not present

## 2020-01-10 DIAGNOSIS — M545 Low back pain: Secondary | ICD-10-CM | POA: Diagnosis not present

## 2020-01-10 DIAGNOSIS — M546 Pain in thoracic spine: Secondary | ICD-10-CM | POA: Diagnosis not present

## 2020-01-10 DIAGNOSIS — M9902 Segmental and somatic dysfunction of thoracic region: Secondary | ICD-10-CM | POA: Diagnosis not present

## 2020-01-10 DIAGNOSIS — M7918 Myalgia, other site: Secondary | ICD-10-CM | POA: Diagnosis not present

## 2020-01-10 DIAGNOSIS — M9903 Segmental and somatic dysfunction of lumbar region: Secondary | ICD-10-CM | POA: Diagnosis not present

## 2020-01-25 DIAGNOSIS — C8511 Unspecified B-cell lymphoma, lymph nodes of head, face, and neck: Secondary | ICD-10-CM | POA: Diagnosis not present

## 2020-01-25 DIAGNOSIS — C8331 Diffuse large B-cell lymphoma, lymph nodes of head, face, and neck: Secondary | ICD-10-CM | POA: Diagnosis not present

## 2020-01-25 DIAGNOSIS — M7912 Myalgia of auxiliary muscles, head and neck: Secondary | ICD-10-CM | POA: Diagnosis not present

## 2020-01-25 DIAGNOSIS — M9902 Segmental and somatic dysfunction of thoracic region: Secondary | ICD-10-CM | POA: Diagnosis not present

## 2020-01-25 DIAGNOSIS — M9901 Segmental and somatic dysfunction of cervical region: Secondary | ICD-10-CM | POA: Diagnosis not present

## 2020-01-25 DIAGNOSIS — M546 Pain in thoracic spine: Secondary | ICD-10-CM | POA: Diagnosis not present

## 2020-01-25 DIAGNOSIS — M7918 Myalgia, other site: Secondary | ICD-10-CM | POA: Diagnosis not present

## 2020-03-13 ENCOUNTER — Encounter: Payer: Self-pay | Admitting: Emergency Medicine

## 2020-03-13 ENCOUNTER — Ambulatory Visit: Payer: Self-pay | Admitting: Emergency Medicine

## 2020-03-13 ENCOUNTER — Other Ambulatory Visit: Payer: Self-pay

## 2020-03-13 VITALS — BP 126/82 | HR 69 | Temp 97.9°F | Resp 14 | Ht 68.0 in | Wt 188.0 lb

## 2020-03-13 DIAGNOSIS — H6591 Unspecified nonsuppurative otitis media, right ear: Secondary | ICD-10-CM

## 2020-03-13 MED ORDER — PREDNISONE 50 MG PO TABS: ORAL_TABLET | ORAL | 0 refills | Status: DC

## 2020-03-13 MED ORDER — AMOXICILLIN 500 MG PO CAPS: 500.0000 mg | ORAL_CAPSULE | Freq: Three times a day (TID) | ORAL | 0 refills | Status: DC

## 2020-03-13 NOTE — Progress Notes (Signed)
  Occupational Health Provider Note       Time seen: 9:06 AM    I have reviewed the vital signs and the nursing notes.  HISTORY   Chief Complaint No chief complaint on file.    HPI Thomas Mckay is a 34 y.o. male with a history of arthritis, non-Hodgkin's lymphoma, seizures who presents today for right ear pain.  Patient states his had right ear pain since Friday or Saturday.  No recent trauma, no fevers, no drainage, no recent swimming.  Has swimmer's ear once this year.  Past Medical History:  Diagnosis Date  . Arthritis    wrists  . History of chlamydia   . History of herpes genitalis   . Non Hodgkin's lymphoma ([place])    in remission. completed treatments 08/2012  . Seizures ([place])    x1. as infant    Past Surgical History:  Procedure Laterality Date  . LYMPH NODE BIOPSY    . SHOULDER ARTHROSCOPY WITH LABRAL REPAIR Right 06/14/2017   Procedure: SHOULDER ARTHROSCOPY WITH LABRAL REPAIR and subacromial debridement.;  Surgeon: Thornton Park, MD;  Location: [place];  Service: Orthopedics;  Laterality: Right;  . WISDOM TOOTH EXTRACTION      Allergies Patient has no known allergies.   Review of Systems Constitutional: Negative for fever. HEENT: Positive for right ear pain Cardiovascular: Negative for chest pain. Respiratory: Negative for shortness of breath. Gastrointestinal: Negative for abdominal pain, vomiting and diarrhea. Skin: Negative for rash. Neurological: Negative for headaches, focal weakness or numbness.  All systems negative/normal/unremarkable except as stated in the HPI  ____________________________________________   PHYSICAL EXAM:  VITAL SIGNS: Vitals:   03/13/20 0904  BP: 126/82  Pulse: 69  Resp: 14  SpO2: 96%    Constitutional: Alert and oriented. Well appearing and in no distress. Eyes: Conjunctivae are normal. Normal extraocular movements. ENT      Head: Normocephalic and atraumatic.      Ears: There is no fluid  behind the right TM, ear canal appears normal, no pain with manipulation of the pinna      Nose: No congestion/rhinnorhea.      Mouth/Throat: Mucous membranes are moist.      Neck: No stridor. Cardiovascular: Normal rate, regular rhythm. No murmurs, rubs, or gallops. Respiratory: Normal respiratory effort without tachypnea nor retractions. Breath sounds are clear and equal bilaterally. No wheezes/rales/rhonchi. Musculoskeletal: Nontender with normal range of motion in extremities. No lower extremity tenderness nor edema. ____________________________________________   LABS (pertinent positives/negatives)  No results found for this or any previous visit (from the past 2160 hour(s)).   ASSESSMENT AND PLAN  Otitis   Plan: The patient had presented for a right otitis from clear etiology.  We will try steroids and amoxicillin.  He is encouraged to return for worsening or worrisome symptoms.  Lenise Arena MD    Note: This note was generated in part or whole with voice recognition software. Voice recognition is usually quite accurate but there are transcription errors that can and very often do occur. I apologize for any typographical errors that were not detected and corrected.

## 2020-03-13 NOTE — Progress Notes (Signed)
Pt states his right ear has been hurting since Friday.

## 2020-04-04 DIAGNOSIS — M9904 Segmental and somatic dysfunction of sacral region: Secondary | ICD-10-CM | POA: Diagnosis not present

## 2020-04-04 DIAGNOSIS — M9903 Segmental and somatic dysfunction of lumbar region: Secondary | ICD-10-CM | POA: Diagnosis not present

## 2020-04-04 DIAGNOSIS — M7911 Myalgia of mastication muscle: Secondary | ICD-10-CM | POA: Diagnosis not present

## 2020-04-04 DIAGNOSIS — M545 Low back pain: Secondary | ICD-10-CM | POA: Diagnosis not present

## 2020-04-09 ENCOUNTER — Encounter: Payer: Self-pay | Admitting: Nurse Practitioner

## 2020-04-09 ENCOUNTER — Other Ambulatory Visit: Payer: Self-pay

## 2020-04-09 ENCOUNTER — Ambulatory Visit: Payer: Self-pay | Admitting: Nurse Practitioner

## 2020-04-09 VITALS — BP 142/92 | HR 69 | Temp 98.3°F | Resp 14 | Ht 67.0 in | Wt 188.0 lb

## 2020-04-09 DIAGNOSIS — M545 Low back pain, unspecified: Secondary | ICD-10-CM

## 2020-04-09 MED ORDER — IBUPROFEN 600 MG PO TABS: 600.0000 mg | ORAL_TABLET | Freq: Three times a day (TID) | ORAL | 0 refills | Status: DC | PRN

## 2020-04-09 MED ORDER — CYCLOBENZAPRINE HCL 5 MG PO TABS: 5.0000 mg | ORAL_TABLET | Freq: Three times a day (TID) | ORAL | 0 refills | Status: DC | PRN

## 2020-04-09 NOTE — Progress Notes (Signed)
Pt presents today with his lower back has been hurting for about week with no injury.  Pt states it constantly hurts through out the day and is having trouble sleeping due to discomfort of his back. Pt says he rather not lay on his back that he has to lay on his side to get comfort to go to sleep CL,rma

## 2020-04-09 NOTE — Progress Notes (Signed)
Subjective:     Patient ID: Thomas Mckay, male   DOB: January 12, 1986, 34 y.o.   MRN: [id number]  HPI  Thomas Mckay is a 34 y.o. male who presents to the [facility] with c/p low back pain. Onset of pain was about a week ago. 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 The pain is located in the mid lower back. He describes the pain as constant, dull and interferes with sleep. He has been taking ibuprofen for the pain with some relief. Employee denies loss of control of bladder or bowels. The pain does not radiate. He denies any direct blow or injury to the lower back.    Review of Systems  Musculoskeletal: Positive for back pain.  All other systems reviewed and are negative.      Objective:   Physical Exam Vitals and nursing note reviewed.  Constitutional:      Appearance: Normal appearance.  HENT:     Head: Normocephalic and atraumatic.  Eyes:     Extraocular Movements: Extraocular movements intact.  Cardiovascular:     Rate and Rhythm: Normal rate.  Pulmonary:     Effort: Pulmonary effort is normal.  Abdominal:     General: Abdomen is flat.     Palpations: Abdomen is soft.     Tenderness: There is no abdominal tenderness.  Musculoskeletal:     Cervical back: Normal range of motion. No signs of trauma or tenderness. Normal range of motion.     Thoracic back: Normal.     Lumbar back: Spasms and tenderness present. Normal range of motion.     Comments: Pain to the lower lumbar area with bending forward. No pain with bending side to side or backward.    Skin:    General: Skin is warm and dry.  Neurological:     Mental Status: He is alert.     Motor: Motor function is intact.     Gait: Gait is intact.     Deep Tendon Reflexes:     Reflex Scores:      Bicep reflexes are 2+ on the right side and 2+ on the left side.      Brachioradialis reflexes are 2+ on the right side and 2+ on the  left side.      Patellar reflexes are 2+ on the right side and 2+ on the left side.    Comments: Grips are equal. Radial pulses and DP 2+.   Psychiatric:        Mood and Affect: Mood normal.        Behavior: Behavior normal.        Assessment:    Thomas Mckay is a 34 y.o. male who complains of low back pain x 1 week. The pain is positional with bending or lifting, without radiation down the legs. Prior history of back problems: recurrent self limited episodes of low back pain in the past. There is no numbness in the legs. Vital signs as noted above. Patient appears to be in mild to moderate pain. Lumbosacral spine area reveals no local tenderness or mass. Straight leg raise is negative. DTR's, motor strength and sensation normal, including heel and toe gait.  Peripheral pulses are palpable. Plan:    Flexeril 5 mg. PO tid, discussed that medication can cause him to be sleepy. Ibuprofen 600 mg TID, prn. Warm wet compresses to the  affected area 3 times a day. Return if symptoms do not improve with treatment. Discussed with patient assessment and plan of care and he voices understanding.

## 2020-04-09 NOTE — Patient Instructions (Addendum)
Heat Therapy Heat therapy can help ease sore, stiff, injured, and tight muscles and joints. Heat relaxes your muscles, which may help ease your pain and muscle spasms. Do not use heat therapy unless your doctor tells you to use it. How to use heat therapy There are several different kinds of heat therapy, including:  Moist heat pack.  Hot water bottle.  Electric heating pad.  Heated gel pack.  Heated wrap.  Warm water bath. Your doctor will tell you how to use heat therapy. In general, you should: 1. Place a towel between your skin and the heat source. 2. Leave the heat on for 20-30 minutes. Your skin may turn pink. 3. Remove the heat if your skin turns bright red. You should remove the heat source if you are unable to feel pain, heat, or cold. You are more likely to get burned if you leave it on the skin for too long. Your doctor may also tell you to take a warm water bath. To do this: 1. Put a non-slip pad in the bathtub to prevent a fall. 2. Fill the bathtub with warm water. 3. Check the water temperature. 4. Soak in the water for 15-20 minutes, or as told by your doctor. 5. Be careful when you stand up after the bath. You may feel dizzy. 6. Pat yourself dry after the bath. Do not rub your skin to dry it. General recommendations for heat therapy  Do not sleep while using heat therapy. Only use heat therapy while you are awake.  Your skin may turn pink while using heat therapy. Do not use heat therapy if your skin turns red.  Do not use heat therapy if you have a new injury.  High heat or using heat for a long time can cause burns. Be careful not to burn your skin when using heat therapy.  Do not use heat therapy on areas of your skin that are already irritated, such as with a rash or sunburn. Get help if you have:  Blisters, redness, swelling (puffiness), or numbness.  New pain.  Pain that is getting worse. Summary  Heat therapy is the use of heat to help ease sore,  stiff, injured, and tight muscles and joints.  There are different types of heat therapy. Your doctor will tell you which one to use.  Only use heat therapy while you are awake.  Watch your skin to make sure you do not get burned while using heat therapy. This information is not intended to replace advice given to you by your health care provider. Make sure you discuss any questions you have with your health care provider. Document Revised: 08/29/2018 Document Reviewed: 07/17/2017 Elsevier Patient Education  [place].  Acute Back Pain, Adult Acute back pain is sudden and usually short-lived. It is often caused by an injury to the muscles and tissues in the back. The injury may result from:  A muscle or ligament getting overstretched or torn (strained). Ligaments are tissues that connect bones to each other. Lifting something improperly can cause a back strain.  Wear and tear (degeneration) of the spinal disks. Spinal disks are circular tissue that provides cushioning between the bones of the spine (vertebrae).  Twisting motions, such as while playing sports or doing yard work.  A hit to the back.  Arthritis. You may have a physical exam, lab tests, and imaging tests to find the cause of your pain. Acute back pain usually goes away with rest and home care. Follow  these instructions at home: Managing pain, stiffness, and swelling  Take over-the-counter and prescription medicines only as told by your health care provider.  Your health care provider may recommend applying ice during the first 24-48 hours after your pain starts. To do this: ? Put ice in a plastic bag. ? Place a towel between your skin and the bag. ? Leave the ice on for 20 minutes, 2-3 times a day.  If directed, apply heat to the affected area as often as told by your health care provider. Use the heat source that your health care provider recommends, such as a moist heat pack or a heating pad. ? Place a towel  between your skin and the heat source. ? Leave the heat on for 20-30 minutes. ? Remove the heat if your skin turns bright red. This is especially important if you are unable to feel pain, heat, or cold. You have a greater risk of getting burned. Activity   Do not stay in bed. Staying in bed for more than 1-2 days can delay your recovery.  Sit up and stand up straight. Avoid leaning forward when you sit, or hunching over when you stand. ? If you work at a desk, sit close to it so you do not need to lean over. Keep your chin tucked in. Keep your neck drawn back, and keep your elbows bent at a right angle. Your arms should look like the letter "L." ? Sit high and close to the steering wheel when you drive. Add lower back (lumbar) support to your car seat, if needed.  Take short walks on even surfaces as soon as you are able. Try to increase the length of time you walk each day.  Do not sit, drive, or stand in one place for more than 30 minutes at a time. Sitting or standing for long periods of time can put stress on your back.  Do not drive or use heavy machinery while taking prescription pain medicine.  Use proper lifting techniques. When you bend and lift, use positions that put less stress on your back: ? Willmar your knees. ? Keep the load close to your body. ? Avoid twisting.  Exercise regularly as told by your health care provider. Exercising helps your back heal faster and helps prevent back injuries by keeping muscles strong and flexible.  Work with a physical therapist to make a safe exercise program, as recommended by your health care provider. Do any exercises as told by your physical therapist. Lifestyle  Maintain a healthy weight. Extra weight puts stress on your back and makes it difficult to have good posture.  Avoid activities or situations that make you feel anxious or stressed. Stress and anxiety increase muscle tension and can make back pain worse. Learn ways to manage  anxiety and stress, such as through exercise. General instructions  Sleep on a firm mattress in a comfortable position. Try lying on your side with your knees slightly bent. If you lie on your back, put a pillow under your knees.  Follow your treatment plan as told by your health care provider. This may include: ? Cognitive or behavioral therapy. ? Acupuncture or massage therapy. ? Meditation or yoga. Contact a health care provider if:  You have pain that is not relieved with rest or medicine.  You have increasing pain going down into your legs or buttocks.  Your pain does not improve after 2 weeks.  You have pain at night.  You lose weight without trying.  You have a fever or chills. Get help right away if:  You develop new bowel or bladder control problems.  You have unusual weakness or numbness in your arms or legs.  You develop nausea or vomiting.  You develop abdominal pain.  You feel faint. Summary  Acute back pain is sudden and usually short-lived.  Use proper lifting techniques. When you bend and lift, use positions that put less stress on your back.  Take over-the-counter and prescription medicines and apply heat or ice as directed by your health care provider. This information is not intended to replace advice given to you by your health care provider. Make sure you discuss any questions you have with your health care provider. Document Revised: 10/25/2018 Document Reviewed: 02/17/2017 Elsevier Patient Education  [place].

## 2020-04-17 DIAGNOSIS — M545 Low back pain: Secondary | ICD-10-CM | POA: Diagnosis not present

## 2020-04-17 DIAGNOSIS — M9903 Segmental and somatic dysfunction of lumbar region: Secondary | ICD-10-CM | POA: Diagnosis not present

## 2020-04-17 DIAGNOSIS — M9904 Segmental and somatic dysfunction of sacral region: Secondary | ICD-10-CM | POA: Diagnosis not present

## 2020-04-17 DIAGNOSIS — M7911 Myalgia of mastication muscle: Secondary | ICD-10-CM | POA: Diagnosis not present

## 2020-04-29 NOTE — Progress Notes (Signed)
Thomas Mckay had Rabies titer on 12/01/19.  Results were >/=0.1 Comment:  Less than 0.1 IU/mL:  Below detection limit.  When Thomas Mckay hired in 05/2014, he informed Thomas China, RN that he received the Rabies pre-vaccine series in 2014. (Documentation from paper chart in [facility])  Presents to clinic today for Rabies booster.  AMD

## 2020-04-30 ENCOUNTER — Ambulatory Visit: Payer: Self-pay

## 2020-04-30 ENCOUNTER — Other Ambulatory Visit: Payer: Self-pay

## 2020-04-30 DIAGNOSIS — Z23 Encounter for immunization: Secondary | ICD-10-CM

## 2020-04-30 MED ORDER — RABIES VACCINE, PCEC IM SUSR
1.0000 mL | Freq: Once | INTRAMUSCULAR | Status: AC
  Administered 2020-04-30: 1 mL via INTRAMUSCULAR

## 2020-05-21 ENCOUNTER — Encounter: Payer: Self-pay | Admitting: Nurse Practitioner

## 2020-05-21 ENCOUNTER — Ambulatory Visit: Payer: 59 | Admitting: Nurse Practitioner

## 2020-05-21 ENCOUNTER — Other Ambulatory Visit: Payer: Self-pay

## 2020-05-21 DIAGNOSIS — Z0184 Encounter for antibody response examination: Secondary | ICD-10-CM

## 2020-05-21 DIAGNOSIS — H9201 Otalgia, right ear: Secondary | ICD-10-CM

## 2020-05-21 MED ORDER — FLUTICASONE PROPIONATE 50 MCG/ACT NA SUSP: 2.0000 | Freq: Every day | NASAL | 6 refills | Status: DC

## 2020-05-21 MED ORDER — NEOMYCIN-POLYMYXIN-HC 3.5-10000-1 OT SOLN: 4.0000 [drp] | Freq: Four times a day (QID) | OTIC | 0 refills | Status: AC

## 2020-05-21 NOTE — Progress Notes (Signed)
Subjective:    Patient ID: Thomas Mckay, male    DOB: Oct 06, 1985, 34 y.o.   MRN: [id number]  HPI 34 year old male here for left ear pain. Notes that when he sneezes his left ear is painful. He does sneeze often, notes he has allergies. Is not currently using Flonase, takes an OTC allergy pill once in awhile. He has been using Dayquil for the past few days with some relief from sneezing. Denies sinus pain or pressure.   Denies fever or other systemic symptoms.   Did have frequent ear infections as a child and potentially had tubes placed- unsure.   Had similar complaints in January was placed on drops appears to have been right ear at that time.   Denies decrease in hearing denies any loud noises or other complaints.   He does use Q-tips frequently.  Past Medical History:  Diagnosis Date  . Arthritis    wrists  . History of chlamydia   . History of herpes genitalis   . Non Hodgkin's lymphoma ([place])    in remission. completed treatments 08/2012  . Seizures ([place])    x1. as infant    Today's Vitals   05/21/20 1425  BP: 135/84  Pulse: 77  Resp: 14  Temp: 98.3 F (36.8 C)  SpO2: 95%  Weight: 192 lb (87.1 kg)  Height: 5\' 7"  (1.702 m)   Body mass index is 30.07 kg/m.  Review of Systems  Constitutional: Negative.   HENT: Positive for congestion, ear pain and sneezing. Negative for hearing loss.   Eyes: Negative.   Respiratory: Negative.   Musculoskeletal: Negative.   Neurological: Negative.        Objective:   Physical Exam Constitutional:      Appearance: Normal appearance.  HENT:     Head: Normocephalic.     Right Ear: Hearing and external ear normal. No decreased hearing noted. A middle ear effusion is present. Tympanic membrane is not injected, erythematous or bulging.     Left Ear: Hearing and external ear normal. A middle ear effusion is present. Tympanic membrane is not injected, erythematous or bulging.     Ears:     Comments: Left canal erythema and  inflammation present     Nose: Nose normal.     Mouth/Throat:     Mouth: Mucous membranes are moist.     Pharynx: Oropharynx is clear. No oropharyngeal exudate or posterior oropharyngeal erythema.  Pulmonary:     Effort: Pulmonary effort is normal.  Skin:    General: Skin is warm and dry.  Neurological:     Mental Status: He is alert and oriented to person, place, and time.  Psychiatric:        Mood and Affect: Mood normal.           Assessment & Plan:  Advised taking Flonase daily to help control allergy symptoms including sneezing. Will Rx for patient convenience.   Start daily oral antihistamine OTC of choice (claritin/zyrtec) as discussed.  Rx for abx+steroid drops to treat left canal inflammation with signs of infection.   Advised against Q-tip use or any other placement of foreign bodies into ears including earbuds or Bluetooth device until symptoms have resolved.  Meds ordered this encounter  Medications  . fluticasone (FLONASE) 50 MCG/ACT nasal spray    Sig: Place 2 sprays into both nostrils daily.    Dispense:  16 g    Refill:  6  . neomycin-polymyxin-hydrocortisone (CORTISPORIN) OTIC solution  Sig: Place 4 drops into the left ear 4 (four) times daily for 7 days.    Dispense:  10 mL    Refill:  0   RTC with any new/worsening or ongoing symptoms as discussed.   Encouraged to continue allergy regimen year round (flonase/oral antihistamine)

## 2020-05-21 NOTE — Progress Notes (Signed)
Pt presents today for ear pain, mostly in left. Pt states that when he sneezes he feels a lot of pressure in his ears and they will pop and hurt x 1.5 weeksCL,RMA

## 2020-05-29 ENCOUNTER — Encounter: Payer: Self-pay | Admitting: Emergency Medicine

## 2020-05-29 ENCOUNTER — Other Ambulatory Visit: Payer: Self-pay

## 2020-05-29 ENCOUNTER — Ambulatory Visit
Admission: EM | Admit: 2020-05-29 | Discharge: 2020-05-29 | Disposition: A | Discharge status: Home / Self Care | Payer: 59 | Attending: Emergency Medicine | Admitting: Emergency Medicine

## 2020-05-29 DIAGNOSIS — R202 Paresthesia of skin: Secondary | ICD-10-CM

## 2020-05-29 MED ORDER — PREDNISONE 10 MG (21) PO TBPK: ORAL_TABLET | Freq: Every day | ORAL | 0 refills | Status: DC

## 2020-05-29 NOTE — Discharge Instructions (Signed)
This is concerning for carpal tunnel, so I have provided a brace for you to wear, all the time, for the next few weeks to see if symptoms improve.  Take course of prednisone as well.  Follow up with orthopedics if symptoms persist as you may need additional evaluation.

## 2020-05-29 NOTE — ED Provider Notes (Signed)
[facility]    CSN: [id number] Arrival date & time: 05/29/20  1047      History   Chief Complaint Chief Complaint  Patient presents with  . Hand Pain    left    HPI Thomas Mckay is a 34 y.o. male.   Thomas Mckay presents with complaints of left hand numbness, pins/needles sensation, which he woke with this morning. No specific known trigger or injury. He works in an [employer], systems with regular physical work but not necessarily repetitive. Hasn't taken anything for pain. No redness or swelling. No elbow shoulder or neck pain. Has had similar in the past, evaluated for carpal tunnel vs arthritis. Can have intermittent symptoms to his right hand as well. Gross sensation is intact.     ROS per HPI, negative if not otherwise mentioned.      Past Medical History:  Diagnosis Date  . Arthritis    wrists  . History of chlamydia   . History of herpes genitalis   . Non Hodgkin's lymphoma ([place])    in remission. completed treatments 08/2012  . Seizures ([place])    x1. as infant    Patient Active Problem List   Diagnosis Date Noted  . Research study patient 12/06/2019  . Abdominal pain 02/22/2019  . Proteinuria 02/22/2019  . Asymptomatic microscopic hematuria 02/22/2019  . Pain in joint involving ankle and foot 02/01/2019  . Achilles bursitis 02/01/2019  . Achilles tendinitis 02/01/2019  . Hip pain 02/01/2019  . Lumbar sprain 02/01/2019  . Sprain of deltoid ligament of ankle 02/01/2019  . Sprain of hand 02/01/2019  . Pre-bariatric surgery nutrition evaluation 06/03/2017  . Gastroesophageal reflux disease 05/24/2017  . Type 2 HSV infection of penis 05/24/2017  . 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 Shoulder joint pain 05/14/2017  . Non-Hodgkin lymphoma ([place]) 04/14/2017  . Bilateral carpal tunnel syndrome 01/28/2017  . Tear of right glenoid labrum 10/09/2014  . Diffuse large B-cell lymphoma of lymph nodes of neck ([place]) 08/02/2014  . History of  antineoplastic chemotherapy 11/22/2012  . History of radiation therapy 11/22/2012  . Allergic rhinitis 08/18/2007    Past Surgical History:  Procedure Laterality Date  . LYMPH NODE BIOPSY    . SHOULDER ARTHROSCOPY WITH LABRAL REPAIR Right 06/14/2017   Procedure: SHOULDER ARTHROSCOPY WITH LABRAL REPAIR and subacromial debridement.;  Surgeon: Thornton Park, MD;  Location: [facility];  Service: Orthopedics;  Laterality: Right;  . WISDOM TOOTH EXTRACTION         Home Medications    Prior to Admission medications   Medication Sig Start Date End Date Taking? Authorizing Provider  fluticasone (FLONASE) 50 MCG/ACT nasal spray Place 2 sprays into both nostrils daily. 05/21/20  Yes Apolonio Schneiders, FNP  valACYclovir (VALTREX) 500 MG tablet Take 500 mg daily by mouth.   Yes [provider]  ibuprofen (ADVIL) 600 MG tablet Take 1 tablet (600 mg total) by mouth every 8 (eight) hours as needed. 04/09/20   Ashley Murrain, NP  predniSONE (STERAPRED UNI-PAK 21 TAB) 10 MG (21) TBPK tablet Take by mouth daily. Per box instruction 05/29/20   Zigmund Gottron, NP  gabapentin (NEURONTIN) 300 MG capsule gabapentin 300 mg capsule  07/04/19  [provider]    Family History Family History  Problem Relation Age of Onset  . Asthma Mother   . Hypertension Father   . Asthma Brother     Social History Social History   Tobacco Use  . Smoking status: Never  Smoker  . Smokeless tobacco: Never Used  Vaping Use  . Vaping Use: Never used  Substance Use Topics  . Alcohol use: Yes    Comment: rare - Holidays  . Drug use: Never     Allergies   Patient has no known allergies.   Review of Systems Review of Systems   Physical Exam Triage Vital Signs ED Triage Vitals  Enc Vitals Group     BP 05/29/20 1124 (!) 121/94     Pulse Rate 05/29/20 1124 74     Resp 05/29/20 1124 18     Temp 05/29/20 1124 98.7 F (37.1 C)     Temp Source 05/29/20 1124 Oral     SpO2 05/29/20  1124 98 %     Weight 05/29/20 1122 192 lb 0.3 oz (87.1 kg)     Height 05/29/20 1122 5\' 7"  (1.702 m)     Head Circumference --      Peak Flow --      Pain Score 05/29/20 1121 2     Pain Loc --      Pain Edu? --      Excl. in [place]? --    No data found.  Updated Vital Signs BP (!) 121/94 (BP Location: Left Arm)   Pulse 74   Temp 98.7 F (37.1 C) (Oral)   Resp 18   Ht 5\' 7"  (1.702 m)   Wt 192 lb 0.3 oz (87.1 kg)   SpO2 98%   BMI 30.07 kg/m    Physical Exam Constitutional:      Appearance: He is well-developed.  Cardiovascular:     Rate and Rhythm: Normal rate.  Pulmonary:     Effort: Pulmonary effort is normal.  Musculoskeletal:     Right wrist: Normal.     Left wrist: Normal.     Left hand: No swelling, deformity, tenderness or bony tenderness. Normal range of motion. Normal strength. Normal sensation. Normal capillary refill. Normal pulse.     Comments: Negative tinel; positive phalen; gross sensation intact to left hand, cap refill <2 sec; full ROM of left hand and wrist; no pain to elbow shoulder or neck; endorses "numb" sensation to all 5 fingers and entire hand   Skin:    General: Skin is warm and dry.  Neurological:     Mental Status: He is alert and oriented to person, place, and time.      UC Treatments / Results  Labs (all labs ordered are listed, but only abnormal results are displayed) Labs Reviewed - No data to display  EKG   Radiology No results found.  Procedures Procedures (including critical care time)  Medications Ordered in UC Medications - No data to display  Initial Impression / Assessment and Plan / UC Course  I have reviewed the triage vital signs and the nursing notes.  Pertinent labs & imaging results that were available during my care of the patient were reviewed by me and considered in my medical decision making (see chart for details).     Per chart review patient has been evaluated for carpal tunnel in the past, bilaterally.  Entire hand involvement rather than 3-5th or 1-2 digits of fingers. No injury. No repetitive use necessarily. No apparent vascular concern on exam. Will trial wrist brace and nsaids to see if this is helpful. Encouraged to also sleep with arms straight if can as may be some cubital tunnel contributing as well? Follow up with ortho if persistent. Patient verbalized understanding and agreeable  to plan.   Final Clinical Impressions(s) / UC Diagnoses   Final diagnoses:  Left hand paresthesia     Discharge Instructions     This is concerning for carpal tunnel, so I have provided a brace for you to wear, all the time, for the next few weeks to see if symptoms improve.  Take course of prednisone as well.  Follow up with orthopedics if symptoms persist as you may need additional evaluation.    ED Prescriptions    Medication Sig Dispense Auth. Provider   predniSONE (STERAPRED UNI-PAK 21 TAB) 10 MG (21) TBPK tablet Take by mouth daily. Per box instruction 21 tablet Zigmund Gottron, NP     PDMP not reviewed this encounter.   Zigmund Gottron, NP 05/29/20 1159

## 2020-05-29 NOTE — ED Triage Notes (Signed)
Pt c/o left hand pain. He states it feels numb and tingles. Started this morning about 6 am. Pt states his right hand started feeling the same way while sitting in the lobby.

## 2020-06-05 ENCOUNTER — Ambulatory Visit: Payer: Self-pay | Admitting: Nurse Practitioner

## 2020-06-05 ENCOUNTER — Other Ambulatory Visit: Payer: Self-pay

## 2020-06-05 ENCOUNTER — Encounter: Payer: Self-pay | Admitting: Nurse Practitioner

## 2020-06-05 VITALS — BP 135/93 | HR 87 | Temp 98.4°F | Resp 14 | Ht 67.0 in | Wt 190.0 lb

## 2020-06-05 DIAGNOSIS — A6000 Herpesviral infection of urogenital system, unspecified: Secondary | ICD-10-CM

## 2020-06-05 MED ORDER — VALACYCLOVIR HCL 500 MG PO TABS: 500.0000 mg | ORAL_TABLET | Freq: Two times a day (BID) | ORAL | 2 refills | Status: DC

## 2020-06-05 NOTE — Progress Notes (Signed)
   Subjective:    Patient ID: Thomas Mckay, male    DOB: [birth year], 34 y.o.   MRN: [id number]  HPI Thomas Mckay is a 34 y.o. male who presents to the [facility] with c/o genital lesions. He reports symptoms started last night and today has vesicular lesions on the shaft of the penis. This is the usual site of infection. He denies fever, chills, dysuria or discharge. He reports that his last infection was probably a year ago. He also reports being under a lot of stress recently and thinks that may be the cause of the outbreak. Last sexual intercourse 3 days ago and was unprotected but reports she also has HSV.   Review of Systems  Constitutional: Negative for fatigue and fever.  HENT: Negative.   Genitourinary: Positive for genital sores. Negative for discharge, dysuria, frequency, hematuria, testicular pain and urgency.  Skin: Positive for rash.  Hematological: Negative for adenopathy.       Objective:   Physical Exam Vitals and nursing note reviewed.  Abdominal:     Tenderness: There is no abdominal tenderness.  Genitourinary:    Comments: Genital exam not done. Patient reports it is the same as always and he knows what it is. No inguinal lymph nodes palpated. Lymphadenopathy:     Lower Body: No right inguinal adenopathy. No left inguinal adenopathy.  Skin:    General: Skin is warm and dry.  Neurological:     Mental Status: He is alert.  Psychiatric:        Mood and Affect: Mood normal.       Assessment & Plan:  Recurrent genital herpes - Plan: valACYclovir (VALTREX) 500 MG tablet Discussed with the patient plan of care. Offered other STI screening and patient declined. If other symptoms develop he will return.

## 2020-06-05 NOTE — Progress Notes (Signed)
Pt needs rx refill on valtrex.CL,RMA

## 2020-06-07 ENCOUNTER — Ambulatory Visit: Payer: Self-pay

## 2020-06-07 ENCOUNTER — Other Ambulatory Visit: Payer: Self-pay

## 2020-06-07 DIAGNOSIS — S76301A Unspecified injury of muscle, fascia and tendon of the posterior muscle group at thigh level, right thigh, initial encounter: Secondary | ICD-10-CM

## 2020-06-07 NOTE — Progress Notes (Signed)
Presents C/O of tightness/pain to right thigh.  States discomfort is constant.  States slipped on a wet floor yesterday at work.  He did not fall to the flood & neither leg came in contact with the floor.  States he was in a stretched position with left leg forward & right leg behind.  Took Ibuprofen 600 mg 1x since incident.  Assessed right thigh: No swelling No skin discoloration No knots felt Skin warm & dry Able to ambulate without diffulty  Applied ACE wrap for support. Ibuprofen 600 mg tid (with food prn) q 8 hrs until re-evaluation on Monday (06/10/20). Advised to use heat or ice prn - which ever is more comfortable.  If worsens over the weekend to follow-up with Urgent Care / ED.  AMD

## 2020-06-10 ENCOUNTER — Other Ambulatory Visit: Payer: Self-pay

## 2020-06-10 ENCOUNTER — Encounter: Payer: Self-pay | Admitting: Nurse Practitioner

## 2020-06-10 ENCOUNTER — Ambulatory Visit: Payer: Self-pay | Admitting: Nurse Practitioner

## 2020-06-10 VITALS — BP 118/81 | HR 83 | Temp 97.8°F | Resp 12 | Ht 67.0 in | Wt 191.0 lb

## 2020-06-10 DIAGNOSIS — M79609 Pain in unspecified limb: Secondary | ICD-10-CM

## 2020-06-10 DIAGNOSIS — M76891 Other specified enthesopathies of right lower limb, excluding foot: Secondary | ICD-10-CM

## 2020-06-10 MED ORDER — CYCLOBENZAPRINE HCL 10 MG PO TABS: 10.0000 mg | ORAL_TABLET | Freq: Three times a day (TID) | ORAL | 0 refills | Status: DC | PRN

## 2020-06-10 NOTE — Patient Instructions (Signed)
Elevate the area as often as possible. Take ibuprofen and the muscle relaxer. The muscle relaxer can make you sleepy.

## 2020-06-10 NOTE — Progress Notes (Signed)
DOI:  06/06/20  States pain is about the same as it was when he was here on Friday. States harder to walk on right leg today than it was on Friday.  States he's been taking ibuprofen tid since Friday. Heat used maybe 2x/day since Friday. Used Ace wrap Friday after leaving the clinic & most of Saturday,  But quit using it because it said it was irritating him.  Harder to sleep due to pain - feels like it's almost cramping up.  AMD

## 2020-06-10 NOTE — Progress Notes (Signed)
   Subjective:    Patient ID: Thomas Mckay, male    DOB: Aug 13, 1985, 34 y.o.   MRN: [id number]  HPI Thomas Mckay is a 34 y.o. male who presents to the [facility] for f/u of injury that occurred 06/07/20. At that time he reported that he slipped in water causing him to stretch his left thigh. He was evaluated after the injury by the Clinic RN and ace wrap was applied and he was told to take ibuprofen prn. He was instructed at that time if symptoms worsened to go to the ED or Urgent Care for further evaluation. Patient reports that he stopped using the ace wrap because it was causing irritation. He states he has been taking ibuprofen but doesn't feel it is helping. He c/o continued pain. Patient reports that when he slipped he almost did a split causing the muscles to stretch in the back of his legs. He feels like there is muscle spasm in his right leg.     Review of Systems  Musculoskeletal: Positive for arthralgias.       Muscle strain and pain  All other systems reviewed and are negative.      Objective: BP 118/81 (BP Location: Left Arm, Patient Position: Sitting, Cuff Size: Large)   Pulse 83   Temp 97.8 F (36.6 C) (Oral)   Resp 12   Ht 5\' 7"  (1.702 m)   Wt 191 lb (86.6 kg)   SpO2 96%   BMI 29.91 kg/m     Physical Exam Vitals and nursing note reviewed.  Constitutional:      Appearance: Normal appearance.  HENT:     Head: Normocephalic.  Cardiovascular:     Rate and Rhythm: Normal rate.  Pulmonary:     Effort: Pulmonary effort is normal.  Musculoskeletal:     Cervical back: Neck supple.     Right knee: No swelling, deformity, effusion, ecchymosis, lacerations or bony tenderness. Normal range of motion. Tenderness present. Normal alignment.     Left knee: Normal.     Comments: I am able to do passive range of motion of the lower extremities causing minimal pain for the patient. There is tenderness to the posterior aspect of the right knee with passive leg raises and deep  palpation. No focal neuro deficits. Distal pulses 2+. Patient can do straight leg raises without difficulty or pain. Full flexion and extension of the knees without pain. Ambulatory without difficulty.  Skin:    General: Skin is warm and dry.  Neurological:     General: No focal deficit present.     Mental Status: He is alert.  Psychiatric:        Mood and Affect: Mood normal.       Assessment & Plan:  Popliteal pain - Plan: cyclobenzaprine (FLEXERIL) 10 MG tablet  Popliteus tendinitis of right lower extremity Ibuprofen OTC, Ace wrap to right knee, Ice, elevate, RTC in one week or sooner if needed. Discussed with the patient clinical findings and plan of care. Patient given opportunity to ask questions. All questions answered and patient voices understanding. Discussed that muscle relaxer can make him sleepy.

## 2020-06-17 ENCOUNTER — Encounter: Payer: Self-pay | Admitting: Nurse Practitioner

## 2020-06-17 ENCOUNTER — Ambulatory Visit: Payer: Self-pay | Admitting: Nurse Practitioner

## 2020-06-17 ENCOUNTER — Other Ambulatory Visit: Payer: Self-pay

## 2020-06-17 VITALS — BP 135/87 | HR 74 | Temp 97.6°F | Resp 14

## 2020-06-17 DIAGNOSIS — M25561 Pain in right knee: Secondary | ICD-10-CM

## 2020-06-17 NOTE — Progress Notes (Signed)
Leg fine over the weekend.  Now says right leg hurting more today because of walking at work & pain now traveling downwards toward calf.  Said the [facility] [place] prescribed helped.  States he's taking ibuprofen 800 mg of ibuprofen 2x/day every day.  Occ ice & elevation at home, but not during the day while at work.  AMD

## 2020-06-17 NOTE — Progress Notes (Signed)
   Subjective:    Patient ID: Thomas Mckay, male    DOB: 1986/02/14, 34 y.o.   MRN: [id number]  HPI  34 year old male here with complaints of pain to posterior right knee. He slipped at work causing right leg to hyper extend without fall on 06/07/2020. Denies any sudden pop at that time, since has had tightness in knee with pain extending to calf now on the right. Sensation intact, denies weakness. Has been wearing an ace bandage at times, was feeling better over long weekend, after returning to work today and walking > 8000 steps pain has returned and intensified.   He has been using Flexeril for relief in the evenings, ibuprofen as needed during the day.   Works at [employer] in [employer].  Today's Vitals   06/17/20 1512  BP: 135/87  Pulse: 74  Resp: 14  Temp: 97.6 F (36.4 C)  TempSrc: Oral  SpO2: 96%   There is no height or weight on file to calculate BMI.  Current Outpatient Medications  Medication Instructions  . cyclobenzaprine (FLEXERIL) 10 mg, Oral, 3 times daily PRN  . fluticasone (FLONASE) 50 MCG/ACT nasal spray 2 sprays, Each Nare, Daily  . ibuprofen (ADVIL) 600 mg, Oral, Every 8 hours PRN  . valACYclovir (VALTREX) 500 mg, Oral, 2 times daily    Review of Systems  Musculoskeletal: Positive for myalgias.  Neurological: Negative.    Past Medical History:  Diagnosis Date  . Arthritis    wrists  . History of chlamydia   . History of herpes genitalis   . Non Hodgkin's lymphoma ([place])    in remission. completed treatments 08/2012  . Seizures ([place])    x1. as infant      Objective:   Physical Exam Musculoskeletal:       Legs:     Comments: Pain is localized to posterior lateral aspect of right knee without limitation to ROM. Tightness is expressed in distal calf, worse with flexion of right foot. Strength 5/5 Sensation intact throughout no noted swelling or deformity of knee. No acute signs of instability at the joint.   Neurological:     Mental Status:  He is alert and oriented to person, place, and time. Mental status is at baseline.  Psychiatric:        Mood and Affect: Mood normal.           Assessment & Plan:  Continue ibuprofen during the day as needed- take with food  May use Flexeril in the evenings as needed for muscle pain/spasm  Placed patient in right knee elastic support today in clinic and advised to wear while ambulating, no need to wear when at rest or when sleeping.   Will refer to PT for evaluation, RTC after PT eval- earlier with any new concerns.   Work note to limit walking as much as possible, light duty will re evaluate the need for restrictions after PT eval.

## 2020-07-16 ENCOUNTER — Other Ambulatory Visit: Payer: Self-pay

## 2020-07-16 DIAGNOSIS — Z1152 Encounter for screening for COVID-19: Secondary | ICD-10-CM

## 2020-07-16 NOTE — Progress Notes (Signed)
Presents to [facility] for on-site outdoor specimen collection for covid screen.  Symptomatic - S/Sx started 07/12/20 Cough & congestion  No known covid exposure  Vaccinated  Has Mychart  AMD

## 2020-07-18 LAB — NOVEL CORONAVIRUS, NAA: SARS-CoV-2, NAA: NOT DETECTED

## 2020-07-18 LAB — SARS-COV-2, NAA 2 DAY TAT

## 2020-07-26 ENCOUNTER — Other Ambulatory Visit: Payer: Self-pay

## 2020-07-26 DIAGNOSIS — J988 Other specified respiratory disorders: Secondary | ICD-10-CM

## 2020-07-26 DIAGNOSIS — Z1152 Encounter for screening for COVID-19: Secondary | ICD-10-CM

## 2020-07-26 DIAGNOSIS — B9789 Other viral agents as the cause of diseases classified elsewhere: Secondary | ICD-10-CM

## 2020-07-26 MED ORDER — PSEUDOEPH-BROMPHEN-DM 30-2-10 MG/5ML PO SYRP: 5.0000 mL | ORAL_SOLUTION | Freq: Four times a day (QID) | ORAL | 0 refills | Status: DC | PRN

## 2020-07-26 NOTE — Addendum Note (Signed)
Addended by: Malachy Moan F on: 07/26/2020 12:03 PM   Modules accepted: Orders

## 2020-07-26 NOTE — Progress Notes (Signed)
   Subjective: Cough    Patient ID: Thomas Mckay, male    DOB: 1986/02/11, 35 y.o.   MRN: [id number]  HPI Patient presents with 2 weeks of intermitting productive and nonproductive cough.  Patient had a Covid test on 07/16/2020 which was negative.  Patient was retested days pending results.  Patient stated recent exposure at job site.  Patient has taken the first COVID-19 vaccine but not taken the booster shot.  Denies fever or chills.  States cough is worse at night.   Review of Systems    Seasonal allergies. Objective:   Physical Exam  Deferred      Assessment & Plan: URI  Patient given prescription for Bromfed-DM and advised to self quarantine pending results of Covid 19 test.

## 2020-07-30 ENCOUNTER — Other Ambulatory Visit: Payer: Self-pay | Admitting: Physician Assistant

## 2020-07-30 LAB — NOVEL CORONAVIRUS, NAA: SARS-CoV-2, NAA: NOT DETECTED

## 2020-07-30 MED ORDER — HYDROCOD POLST-CPM POLST ER 10-8 MG/5ML PO SUER: 5.0000 mL | Freq: Two times a day (BID) | ORAL | 0 refills | Status: DC

## 2020-07-31 ENCOUNTER — Ambulatory Visit: Payer: No Typology Code available for payment source | Attending: Nurse Practitioner

## 2020-08-01 ENCOUNTER — Encounter: Payer: Self-pay | Admitting: Adult Medicine

## 2020-08-01 ENCOUNTER — Ambulatory Visit: Payer: Self-pay | Admitting: Adult Medicine

## 2020-08-01 ENCOUNTER — Other Ambulatory Visit: Payer: Self-pay

## 2020-08-01 VITALS — BP 144/105 | Temp 99.3°F | Resp 14 | Ht 67.0 in | Wt 195.0 lb

## 2020-08-01 DIAGNOSIS — J209 Acute bronchitis, unspecified: Secondary | ICD-10-CM

## 2020-08-02 NOTE — Progress Notes (Signed)
Thomas Mckay called & said Dr. Noreene Larsson was going to prescribe a Zpack for him , but his pharmacy said they haven't received a Rx from [place].  Dr. Noreene Larsson hasn't completed charting on this encounter & the order for a Zpack wasn't there.  Contacted Dr. Noreene Larsson & received a verbal order to call a Zpack into pharmacy for [facility].  Z pack 250 mg tabs 2 tabs x1 day 1 tab x4 days Qty. 6 tabs No Refills  Rx called into [facility] in [place] at [phone].  Thomas Mckay notified that Rx called into his pharmacy.  AMD

## 2020-08-05 ENCOUNTER — Ambulatory Visit: Payer: 59

## 2020-08-06 NOTE — Progress Notes (Signed)
+    Subjective:    Patient ID: LAVAL CAFARO, male    DOB: 06-24-1986, 35 y.o.   MRN: [id number]  HPI  64 y covid19 neg, hx cough given antitussive 07/30/10 continues with cough sputum productive, HA  Pmh OSA x1y Hx of lymphoma chemRx 2018     Review of Systems Denies fever, chills,  10-15y pta mycoplasma    Objective:   Physical Exam Elevated BP 144/105 o2 sat97% Heent [place] at, perrla +nasal mucus, thyroid non palpable no cervical adenopathy Pulm bronchial bs non localized with fremitus or rhonchi [place] Nsr no murmers ectopy Abd soft nontender bs+, organomegaly- Extr nl color and shape nail nail beds  Neuro intact no focal finding Skin no rash       Assessment & Plan:  Bronchitis  Z-pak 5 day course  F/u in 7days if no relief  See pmh

## 2020-08-08 ENCOUNTER — Encounter: Payer: Self-pay | Admitting: Adult Medicine

## 2020-08-08 ENCOUNTER — Ambulatory Visit: Payer: Self-pay | Admitting: Adult Medicine

## 2020-08-08 ENCOUNTER — Other Ambulatory Visit: Payer: Self-pay

## 2020-08-08 VITALS — BP 140/86 | HR 88 | Temp 98.1°F | Resp 14 | Ht 67.0 in | Wt 195.0 lb

## 2020-08-08 DIAGNOSIS — M542 Cervicalgia: Secondary | ICD-10-CM

## 2020-08-08 NOTE — Progress Notes (Signed)
   Subjective:Right lateral neck pain    Patient ID: Thomas Mckay, male    DOB: 10-05-1985, 34 y.o.   MRN: [id number]  HPI Patient c/o right lateral neck pain for 3 days. States onset after a coughing spell. Denies radicular pain to upper extremities. No loss of sensation or movement of neck and upper extremities.   Review of Systems Negative except for compliant.    Objective:   Physical Exam per PA Ron Tamala Julian Initial BP 140/86. Retook BP s/p 10 minutes 128/78. Pulse 88, Resp 14, Temp 98.1. No acute distress. No obvious cervical deformity. Free and equal ROM of cervical spine and upper extremities. Strength  5/5 against resistance.      Assessment & Plan:  cervicalgia   Follow discharge care instruction and return if no improvement or worsening of compliant. Lidocaine patch placed with quick relief per client

## 2020-08-29 ENCOUNTER — Other Ambulatory Visit: Payer: Self-pay

## 2020-08-29 DIAGNOSIS — Z1152 Encounter for screening for COVID-19: Secondary | ICD-10-CM

## 2020-08-31 LAB — NOVEL CORONAVIRUS, NAA: SARS-CoV-2, NAA: NOT DETECTED

## 2020-08-31 LAB — SARS-COV-2, NAA 2 DAY TAT

## 2020-09-23 ENCOUNTER — Encounter: Payer: Self-pay | Admitting: Nurse Practitioner

## 2020-09-23 ENCOUNTER — Ambulatory Visit: Payer: Self-pay | Admitting: Nurse Practitioner

## 2020-09-23 ENCOUNTER — Other Ambulatory Visit: Payer: Self-pay

## 2020-09-23 VITALS — BP 125/84 | HR 78 | Temp 98.1°F | Resp 14 | Ht 67.0 in | Wt 195.0 lb

## 2020-09-23 DIAGNOSIS — S8991XD Unspecified injury of right lower leg, subsequent encounter: Secondary | ICD-10-CM

## 2020-09-23 NOTE — Addendum Note (Signed)
Addended by: Apolonio Schneiders E on: 09/23/2020 04:02 PM   Modules accepted: Orders

## 2020-09-23 NOTE — Progress Notes (Signed)
   Subjective:    Patient ID: Thomas Mckay, male    DOB: 31-Jan-1986, 35 y.o.   MRN: [id number]  HPI  35 year old male returning for follow up regarding injury sustained at work 06/07/2020. He was initially seen in office for injury on 06/17/2020. At the time of injury he hyperextended right lew and has been suffering from knee pain since that time.   He started PT in January and went to 6 sessions with 60% improvement, PT recommended continuing sessions and patient is here for Rx renewal.   He feels that he has been able to slowly increase his workload, now feels comfortable lifting 30lbs, walking more, is mostly bothered by repetitive bending.   Was also given a brace by PT he has not been wearing recently. Has noticed since stopping PT over the past month he has had more stiffness especially in the am.  Today's Vitals   09/23/20 1409  BP: 125/84  Pulse: 78  Resp: 14  Temp: 98.1 F (36.7 C)  SpO2: 96%  Weight: 195 lb (88.5 kg)  Height: 5\' 7"  (1.702 m)   Body mass index is 30.54 kg/m.  Review of Systems  Constitutional: Negative.   Respiratory: Negative.   Cardiovascular: Negative.   Genitourinary: Negative.   Musculoskeletal: Positive for arthralgias.       Objective:   Physical Exam HENT:     Head: Normocephalic.  Musculoskeletal:        General: Normal range of motion.     Right knee: Normal range of motion.     Instability Tests: Medial McMurray test negative and lateral McMurray test negative.     Left knee: Normal range of motion.     Instability Tests: Medial McMurray test negative and lateral McMurray test negative.  Skin:    General: Skin is warm.  Neurological:     General: No focal deficit present.     Mental Status: He is alert.           Assessment & Plan:  Will renew PT prescription, may continue until reaches improvement plateau  Continue to wear brace at work as needed  Slowly increase activity   RTW with 30lb restriction and  repetitive motion (bending) restriction.   RTC when PT finished for final evaluation and work evaluation

## 2020-09-24 ENCOUNTER — Ambulatory Visit: Payer: 59

## 2020-11-14 ENCOUNTER — Ambulatory Visit: Payer: Self-pay | Admitting: Nurse Practitioner

## 2020-11-14 ENCOUNTER — Other Ambulatory Visit: Payer: Self-pay

## 2020-11-14 ENCOUNTER — Ambulatory Visit
Admission: RE | Admit: 2020-11-14 | Discharge: 2020-11-14 | Disposition: A | Discharge status: Home / Self Care | Payer: No Typology Code available for payment source | Attending: Nurse Practitioner | Admitting: Nurse Practitioner

## 2020-11-14 ENCOUNTER — Encounter: Payer: Self-pay | Admitting: Nurse Practitioner

## 2020-11-14 ENCOUNTER — Ambulatory Visit
Admission: RE | Admit: 2020-11-14 | Discharge: 2020-11-14 | Disposition: A | Discharge status: Home / Self Care | Payer: No Typology Code available for payment source | Source: Ambulatory Visit | Attending: Nurse Practitioner | Admitting: Nurse Practitioner

## 2020-11-14 VITALS — BP 122/81 | HR 69 | Temp 98.2°F | Resp 14 | Ht 67.0 in | Wt 191.0 lb

## 2020-11-14 DIAGNOSIS — Z09 Encounter for follow-up examination after completed treatment for conditions other than malignant neoplasm: Secondary | ICD-10-CM | POA: Insufficient documentation

## 2020-11-14 DIAGNOSIS — S8991XD Unspecified injury of right lower leg, subsequent encounter: Secondary | ICD-10-CM

## 2020-11-14 NOTE — Patient Instructions (Signed)
We have referred to an orthopedic doctor for further evaluation of your injury. Someone will call you from their office to schedule the appointment.

## 2020-11-14 NOTE — Progress Notes (Signed)
   Subjective:    Patient ID: Thomas Mckay, male    DOB: 12-Jul-1986, 35 y.o.   MRN: [id number]  HPI Thomas Mckay is a 35 y.o. male who presents to the [facility] for f/u of back pain due to injury that occurred 06/06/2020. Patient reports that he has had 10 PT sessions but continues to have pain. The last PT session was about a month ago. He describes the pain as aching and tight and it wakes him up sometimes at night. The pain comes and goes. He is taking ibuprofen for the pain and occasionally using Biofreeze. He does not use the elastic bandage because it is irritating. He does not take the flexeril because it makes him sleepy.   Review of Systems  Musculoskeletal:       Right posterior knee pain  All other systems reviewed and are negative.      Objective:   Physical Exam Vitals and nursing note reviewed.  Constitutional:      General: He is not in acute distress.    Appearance: Normal appearance.  HENT:     Head: Normocephalic.  Eyes:     Extraocular Movements: Extraocular movements intact.  Cardiovascular:     Rate and Rhythm: Normal rate.  Pulmonary:     Effort: Pulmonary effort is normal.  Musculoskeletal:        General: Normal range of motion.     Cervical back: Normal range of motion.     Right knee: No swelling, deformity, effusion, erythema, ecchymosis, lacerations or bony tenderness. Normal range of motion. Tenderness present. Normal alignment. Normal pulse.     Comments: Tender to palpation right posterior knee. Full range of motion without difficulty. There is no calf tenderness. The compartment is soft. There is no increased warmth and no erythema or other change in color.  Skin:    General: Skin is warm and dry.  Neurological:     General: No focal deficit present.     Mental Status: He is alert.  Psychiatric:        Mood and Affect: Mood normal.        Behavior: Behavior normal.       Assessment & Plan:  1. Follow-up exam - DG Knee Complete 4 Views  Right; Future  2. Injury of right knee, subsequent encounter - AMB referral to orthopedics Discussed with the patient that since it has been almost 6 months and he has not responded to treatment with medication, appliances or PT that we will make a referral to ortho for further evaluation. Patient given the opportunity to ask questions. All questioned fully answered and patient voices understanding.

## 2020-11-14 NOTE — Progress Notes (Signed)
Pt presents today for follow up with workers comp: right leg. Pt needs apt for physical therapy. CL,RMA

## 2020-11-21 DIAGNOSIS — S39012A Strain of muscle, fascia and tendon of lower back, initial encounter: Secondary | ICD-10-CM | POA: Diagnosis not present

## 2020-11-21 DIAGNOSIS — M4696 Unspecified inflammatory spondylopathy, lumbar region: Secondary | ICD-10-CM | POA: Diagnosis not present

## 2020-11-28 DIAGNOSIS — M545 Low back pain, unspecified: Secondary | ICD-10-CM | POA: Diagnosis not present

## 2020-12-20 DIAGNOSIS — M25561 Pain in right knee: Secondary | ICD-10-CM | POA: Insufficient documentation

## 2020-12-26 ENCOUNTER — Ambulatory Visit: Payer: 59 | Attending: Orthopaedic Surgery

## 2020-12-26 ENCOUNTER — Other Ambulatory Visit: Payer: Self-pay

## 2020-12-26 DIAGNOSIS — M545 Low back pain, unspecified: Secondary | ICD-10-CM | POA: Insufficient documentation

## 2020-12-26 DIAGNOSIS — M6281 Muscle weakness (generalized): Secondary | ICD-10-CM | POA: Diagnosis not present

## 2020-12-26 DIAGNOSIS — R262 Difficulty in walking, not elsewhere classified: Secondary | ICD-10-CM | POA: Diagnosis not present
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
## 2020-12-26 NOTE — Patient Instructions (Addendum)
Standing R lateral shift correction 10x3 with 5 second holds   Hooklying trasversus abdominis contraction 10x3 with 5 second holds

## 2021-01-01 ENCOUNTER — Other Ambulatory Visit: Payer: Self-pay

## 2021-01-01 ENCOUNTER — Ambulatory Visit: Payer: 59

## 2021-01-01 DIAGNOSIS — R262 Difficulty in walking, not elsewhere classified: Secondary | ICD-10-CM

## 2021-01-01 DIAGNOSIS — M545 Low back pain, unspecified: Secondary | ICD-10-CM | POA: Diagnosis not present

## 2021-01-01 DIAGNOSIS — M6281 Muscle weakness (generalized): Secondary | ICD-10-CM | POA: Diagnosis not present

## 2021-01-01 NOTE — Patient Instructions (Signed)
Access Code: [email or web address] URL: [email or web address] Date: 01/01/2021 Prepared by: Joneen Boers  Exercises Bent Knee Fallouts - 1 x daily - 7 x weekly - 3 sets - 10 reps
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
## 2021-01-07 ENCOUNTER — Ambulatory Visit: Payer: 59

## 2021-01-07 ENCOUNTER — Other Ambulatory Visit: Payer: Self-pay

## 2021-01-07 DIAGNOSIS — R262 Difficulty in walking, not elsewhere classified: Secondary | ICD-10-CM

## 2021-01-07 DIAGNOSIS — M6281 Muscle weakness (generalized): Secondary | ICD-10-CM | POA: Diagnosis not present

## 2021-01-07 DIAGNOSIS — M545 Low back pain, unspecified: Secondary | ICD-10-CM | POA: Diagnosis not present
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
## 2021-01-08 ENCOUNTER — Ambulatory Visit: Payer: 59

## 2021-01-10 NOTE — Progress Notes (Signed)
Scheduled to complete physical 01/16/21 with Orson Gear, NP-C.  Physical has to be rescheduled due to Covid testing.  States his child was taken to MD office on Wednesday & was tested for Covid.  Notified of positive results on Saturday.  S/Sx since Saturday: Sneezing, Congestion & earache  Rapid Home Covid Test Sat = Negative Rapid Home Test today (Mon) = Negative  Vaccinated - No booster  Presents to [facility] [place] [facility] for on-site outdoor specimen collection for PCR Covid Test  Has Mychart  AMD

## 2021-01-13 ENCOUNTER — Other Ambulatory Visit: Payer: Self-pay

## 2021-01-13 ENCOUNTER — Telehealth: Payer: Self-pay

## 2021-01-13 DIAGNOSIS — Z20822 Contact with and (suspected) exposure to covid-19: Secondary | ICD-10-CM

## 2021-01-13 DIAGNOSIS — Z0000 Encounter for general adult medical examination without abnormal findings: Secondary | ICD-10-CM

## 2021-01-13 NOTE — Telephone Encounter (Signed)
Amin called to report his son tested positive for COVID. This morning, Thomas Mckay feels congested. He has been advised to remove himself from work and come to [facility] for lab testing. 2 rap[place] at home negative.

## 2021-01-14 ENCOUNTER — Other Ambulatory Visit: Payer: Self-pay

## 2021-01-14 ENCOUNTER — Ambulatory Visit: Payer: 59

## 2021-01-14 DIAGNOSIS — M545 Low back pain, unspecified: Secondary | ICD-10-CM

## 2021-01-14 DIAGNOSIS — M6281 Muscle weakness (generalized): Secondary | ICD-10-CM | POA: Diagnosis not present

## 2021-01-14 DIAGNOSIS — R262 Difficulty in walking, not elsewhere classified: Secondary | ICD-10-CM

## 2021-01-14 LAB — NOVEL CORONAVIRUS, NAA: SARS-CoV-2, NAA: NOT DETECTED

## 2021-01-14 LAB — SARS-COV-2, NAA 2 DAY TAT
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
## 2021-01-15 ENCOUNTER — Ambulatory Visit: Payer: Self-pay

## 2021-01-15 DIAGNOSIS — Z0000 Encounter for general adult medical examination without abnormal findings: Secondary | ICD-10-CM

## 2021-01-15 LAB — POCT URINALYSIS DIPSTICK
Bilirubin, UA: NEGATIVE
Blood, UA: POSITIVE
Glucose, UA: NEGATIVE
Ketones, UA: NEGATIVE
Leukocytes, UA: NEGATIVE
Nitrite, UA: NEGATIVE
Protein, UA: POSITIVE — AB
Spec Grav, UA: 1.025 (ref 1.010–1.025)
Urobilinogen, UA: 0.2 E.U./dL
pH, UA: 6 (ref 5.0–8.0)

## 2021-01-15 NOTE — Progress Notes (Signed)
Scheduled to complete physical 01/21/21 with Randel Pigg, PA-C.  AMD

## 2021-01-16 LAB — CMP12+LP+TP+TSH+6AC+CBC/D/PLT
ALT: 30 IU/L (ref 0–44)
AST: 22 IU/L (ref 0–40)
Albumin/Globulin Ratio: 1.6 (ref 1.2–2.2)
Albumin: 4.6 g/dL (ref 4.0–5.0)
Alkaline Phosphatase: 85 IU/L (ref 44–121)
BUN/Creatinine Ratio: 10 (ref 9–20)
BUN: 10 mg/dL (ref 6–20)
Basophils Absolute: 0 10*3/uL (ref 0.0–0.2)
Basos: 1 %
Bilirubin Total: 1.4 mg/dL — ABNORMAL HIGH (ref 0.0–1.2)
Calcium: 9 mg/dL (ref 8.7–10.2)
Chloride: 104 mmol/L (ref 96–106)
Chol/HDL Ratio: 4.1 ratio (ref 0.0–5.0)
Cholesterol, Total: 128 mg/dL (ref 100–199)
Creatinine, Ser: 0.98 mg/dL (ref 0.76–1.27)
EOS (ABSOLUTE): 0.3 10*3/uL (ref 0.0–0.4)
Eos: 7 %
Estimated CHD Risk: 0.8 times avg. (ref 0.0–1.0)
Free Thyroxine Index: 1.9 (ref 1.2–4.9)
GGT: 29 IU/L (ref 0–65)
Globulin, Total: 2.8 g/dL (ref 1.5–4.5)
Glucose: 82 mg/dL (ref 65–99)
HDL: 31 mg/dL — ABNORMAL LOW (ref >39)
Hematocrit: 45.4 % (ref 37.5–51.0)
Hemoglobin: 15.7 g/dL (ref 13.0–17.7)
Immature Grans (Abs): 0 10*3/uL (ref 0.0–0.1)
Immature Granulocytes: 0 %
Iron: 130 ug/dL (ref 38–169)
LDH: 193 IU/L (ref 121–224)
LDL Chol Calc (NIH): 81 mg/dL (ref 0–99)
Lymphocytes Absolute: 1.9 10*3/uL (ref 0.7–3.1)
Lymphs: 47 %
MCH: 28.4 pg (ref 26.6–33.0)
MCHC: 34.6 g/dL (ref 31.5–35.7)
MCV: 82 fL (ref 79–97)
Monocytes Absolute: 0.3 10*3/uL (ref 0.1–0.9)
Monocytes: 7 %
Neutrophils Absolute: 1.5 10*3/uL (ref 1.4–7.0)
Neutrophils: 38 %
Phosphorus: 2.9 mg/dL (ref 2.8–4.1)
Platelets: 154 10*3/uL (ref 150–450)
Potassium: 4.2 mmol/L (ref 3.5–5.2)
RBC: 5.53 x10E6/uL (ref 4.14–5.80)
RDW: 11.7 % (ref 11.6–15.4)
Sodium: 142 mmol/L (ref 134–144)
T3 Uptake Ratio: 22 % — ABNORMAL LOW (ref 24–39)
T4, Total: 8.5 ug/dL (ref 4.5–12.0)
TSH: 2.77 u[IU]/mL (ref 0.450–4.500)
Total Protein: 7.4 g/dL (ref 6.0–8.5)
Triglycerides: 82 mg/dL (ref 0–149)
Uric Acid: 6.8 mg/dL (ref 3.8–8.4)
VLDL Cholesterol Cal: 16 mg/dL (ref 5–40)
WBC: 3.9 10*3/uL (ref 3.4–10.8)
eGFR: 104 mL/min/{1.73_m2} (ref >59)

## 2021-01-21 ENCOUNTER — Other Ambulatory Visit: Payer: Self-pay

## 2021-01-21 ENCOUNTER — Ambulatory Visit: Payer: Self-pay | Admitting: Physician Assistant

## 2021-01-21 ENCOUNTER — Encounter: Payer: Self-pay | Admitting: Physician Assistant

## 2021-01-21 VITALS — BP 128/97 | HR 67 | Temp 98.0°F | Resp 14 | Ht 68.0 in | Wt 193.0 lb

## 2021-01-21 DIAGNOSIS — Z0000 Encounter for general adult medical examination without abnormal findings: Secondary | ICD-10-CM

## 2021-01-21 DIAGNOSIS — J0100 Acute maxillary sinusitis, unspecified: Secondary | ICD-10-CM

## 2021-01-21 MED ORDER — AMOXICILLIN 875 MG PO TABS
875.0000 mg | ORAL_TABLET | Freq: Two times a day (BID) | ORAL | 0 refills | Status: DC
Start: 2021-01-21 — End: 2021-03-31

## 2021-01-21 MED ORDER — FEXOFENADINE-PSEUDOEPHED ER 60-120 MG PO TB12: 1.0000 | ORAL_TABLET | Freq: Two times a day (BID) | ORAL | 0 refills | Status: DC

## 2021-01-21 NOTE — Progress Notes (Signed)
   Subjective: Annual physical exam    Patient ID: Thomas Mckay, male    DOB: 1986-03-27, 35 y.o.   MRN: [id number]  HPI Patient presents for annual physical exam.  Patient was concerned for greater than 2 weeks of sinus congestion and ear pressure.  Patient no relief over-the-counter medications.   Review of Systems Seasonal rhinitis    Objective:   Physical Exam No acute distress.  Temperature is 98, pulse 67, respiration 14, BP is 128/97, and patient 96% O2 sat on room air. HEENT is remarkable bilateral maxillary guarding, edematous nasal turbinates, and postnasal drainage.  Neck is supple active [place] or bruits.  Lungs are clear to auscultation.  Heart regular rate and rhythm.   Abdomen is negative HSM, normoactive bowel sounds, soft, nontender to palpation. No obvious deformity to the upper or lower extremities.  Patient has full and equal range of motion of the upper and lower extremities. No obvious deformity to cervical lumbar spine.  Patient has full and equal range of motion of the cervical lumbar spine. Cranial nerves II through XII are grossly intact.  DTRs 2+ without clonus.        Assessment & Plan: Well exam.  Patient for the exam today consistent with subacute maxillary sinusitis.  Patient given prescription amoxicillin and Allegra-D.  Discussed lab results with patient.  Patient advised follow-up as necessary.

## 2021-01-21 NOTE — Progress Notes (Signed)
Pt presents today to complete physical with Ron Smith,PA-C, Pt stated he has taken day quil for about 2 weeks, sudafed for a week, and nasal spray past 4 days and feels no sinus pressure relief. Pt states right here keeps having pressure when he eats, yawns but its getting painful. CL,RMA   Reviewed CDC recommendations for importance of HIV/ Hep C screening once in lifetime. Patient has declined HIV / Hep C screenings today and will let us know at next apt he will have his labs done.   Pt also stated he would call and up date covid vaccine date. CL,RMA

## 2021-01-22 ENCOUNTER — Ambulatory Visit: Payer: 59 | Attending: Orthopaedic Surgery

## 2021-01-22 DIAGNOSIS — M545 Low back pain, unspecified: Secondary | ICD-10-CM | POA: Diagnosis not present

## 2021-01-22 DIAGNOSIS — M6281 Muscle weakness (generalized): Secondary | ICD-10-CM | POA: Diagnosis not present

## 2021-01-22 DIAGNOSIS — R262 Difficulty in walking, not elsewhere classified: Secondary | ICD-10-CM | POA: Diagnosis not present
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
## 2021-01-23 ENCOUNTER — Other Ambulatory Visit: Payer: Self-pay

## 2021-01-23 DIAGNOSIS — A6000 Herpesviral infection of urogenital system, unspecified: Secondary | ICD-10-CM

## 2021-01-23 MED ORDER — VALACYCLOVIR HCL 500 MG PO TABS
500.0000 mg | ORAL_TABLET | Freq: Two times a day (BID) | ORAL | 2 refills | Status: DC
Start: 2021-01-23 — End: 2021-03-19

## 2021-01-27 ENCOUNTER — Ambulatory Visit: Payer: 59

## 2021-01-27 DIAGNOSIS — M545 Low back pain, unspecified: Secondary | ICD-10-CM | POA: Diagnosis not present

## 2021-01-27 DIAGNOSIS — R262 Difficulty in walking, not elsewhere classified: Secondary | ICD-10-CM | POA: Diagnosis not present

## 2021-01-27 DIAGNOSIS — M6281 Muscle weakness (generalized): Secondary | ICD-10-CM
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
## 2021-01-30 ENCOUNTER — Ambulatory Visit: Payer: 59

## 2021-01-30 DIAGNOSIS — R262 Difficulty in walking, not elsewhere classified: Secondary | ICD-10-CM | POA: Diagnosis not present

## 2021-01-30 DIAGNOSIS — M545 Low back pain, unspecified: Secondary | ICD-10-CM

## 2021-01-30 DIAGNOSIS — M6281 Muscle weakness (generalized): Secondary | ICD-10-CM | POA: Diagnosis not present

## 2021-01-30 DIAGNOSIS — C8511 Unspecified B-cell lymphoma, lymph nodes of head, face, and neck: Secondary | ICD-10-CM | POA: Diagnosis not present

## 2021-01-30 DIAGNOSIS — C8331 Diffuse large B-cell lymphoma, lymph nodes of head, face, and neck: Secondary | ICD-10-CM | POA: Diagnosis not present
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
## 2021-02-06 ENCOUNTER — Ambulatory Visit: Payer: 59

## 2021-02-06 DIAGNOSIS — M545 Low back pain, unspecified: Secondary | ICD-10-CM | POA: Diagnosis not present

## 2021-02-06 DIAGNOSIS — R262 Difficulty in walking, not elsewhere classified: Secondary | ICD-10-CM | POA: Diagnosis not present

## 2021-02-06 DIAGNOSIS — M6281 Muscle weakness (generalized): Secondary | ICD-10-CM

## 2021-02-06 NOTE — Patient Instructions (Signed)
  Sitting on a chair    Press your hands on your thighs to feel your abdominal muscles contract.   Hold for 5 seconds comfortably.   Repeat 10 times.   Perform 3 sets daily.
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
## 2021-02-10 DIAGNOSIS — M545 Low back pain, unspecified: Secondary | ICD-10-CM | POA: Diagnosis not present

## 2021-02-11 ENCOUNTER — Ambulatory Visit: Payer: 59

## 2021-02-11 ENCOUNTER — Telehealth: Payer: Self-pay

## 2021-02-11 NOTE — Telephone Encounter (Signed)
No show. Called patient and left a message pertaining to appointment and a reminder for the next follow up session. Return phone call requested. Phone number ([phone]) provided.   

## 2021-02-13 ENCOUNTER — Ambulatory Visit: Payer: 59

## 2021-02-13 DIAGNOSIS — M6281 Muscle weakness (generalized): Secondary | ICD-10-CM

## 2021-02-13 DIAGNOSIS — M545 Low back pain, unspecified: Secondary | ICD-10-CM | POA: Diagnosis not present

## 2021-02-13 DIAGNOSIS — R262 Difficulty in walking, not elsewhere classified: Secondary | ICD-10-CM
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
## 2021-02-19 ENCOUNTER — Ambulatory Visit: Payer: 59 | Attending: Orthopaedic Surgery

## 2021-02-19 DIAGNOSIS — M6281 Muscle weakness (generalized): Secondary | ICD-10-CM | POA: Insufficient documentation

## 2021-02-19 DIAGNOSIS — R262 Difficulty in walking, not elsewhere classified: Secondary | ICD-10-CM | POA: Diagnosis not present

## 2021-02-19 DIAGNOSIS — M545 Low back pain, unspecified: Secondary | ICD-10-CM
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
## 2021-02-26 ENCOUNTER — Ambulatory Visit: Payer: 59

## 2021-03-03 ENCOUNTER — Ambulatory Visit: Payer: 59

## 2021-03-03 DIAGNOSIS — R262 Difficulty in walking, not elsewhere classified: Secondary | ICD-10-CM | POA: Diagnosis not present

## 2021-03-03 DIAGNOSIS — M545 Low back pain, unspecified: Secondary | ICD-10-CM

## 2021-03-03 DIAGNOSIS — M6281 Muscle weakness (generalized): Secondary | ICD-10-CM | POA: Diagnosis not present
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
## 2021-03-05 ENCOUNTER — Ambulatory Visit: Payer: 59

## 2021-03-05 DIAGNOSIS — M6281 Muscle weakness (generalized): Secondary | ICD-10-CM | POA: Diagnosis not present

## 2021-03-05 DIAGNOSIS — M545 Low back pain, unspecified: Secondary | ICD-10-CM | POA: Diagnosis not present

## 2021-03-05 DIAGNOSIS — R262 Difficulty in walking, not elsewhere classified: Secondary | ICD-10-CM
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
## 2021-03-10 ENCOUNTER — Ambulatory Visit: Payer: 59

## 2021-03-10 DIAGNOSIS — R262 Difficulty in walking, not elsewhere classified: Secondary | ICD-10-CM

## 2021-03-10 DIAGNOSIS — M545 Low back pain, unspecified: Secondary | ICD-10-CM | POA: Diagnosis not present

## 2021-03-10 DIAGNOSIS — M6281 Muscle weakness (generalized): Secondary | ICD-10-CM | POA: Diagnosis not present
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
## 2021-03-12 ENCOUNTER — Ambulatory Visit: Payer: 59

## 2021-03-13 ENCOUNTER — Other Ambulatory Visit: Payer: Self-pay

## 2021-03-13 ENCOUNTER — Ambulatory Visit: Payer: 59

## 2021-03-13 DIAGNOSIS — M545 Low back pain, unspecified: Secondary | ICD-10-CM

## 2021-03-13 DIAGNOSIS — R262 Difficulty in walking, not elsewhere classified: Secondary | ICD-10-CM | POA: Diagnosis not present

## 2021-03-13 DIAGNOSIS — M6281 Muscle weakness (generalized): Secondary | ICD-10-CM
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
## 2021-03-17 ENCOUNTER — Ambulatory Visit: Payer: 59

## 2021-03-17 DIAGNOSIS — M6281 Muscle weakness (generalized): Secondary | ICD-10-CM | POA: Diagnosis not present

## 2021-03-17 DIAGNOSIS — R262 Difficulty in walking, not elsewhere classified: Secondary | ICD-10-CM

## 2021-03-17 DIAGNOSIS — M545 Low back pain, unspecified: Secondary | ICD-10-CM
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
## 2021-03-19 ENCOUNTER — Other Ambulatory Visit: Payer: Self-pay

## 2021-03-19 ENCOUNTER — Ambulatory Visit: Payer: 59

## 2021-03-19 DIAGNOSIS — A6000 Herpesviral infection of urogenital system, unspecified: Secondary | ICD-10-CM

## 2021-03-19 MED ORDER — VALACYCLOVIR HCL 500 MG PO TABS: 500.0000 mg | ORAL_TABLET | Freq: Two times a day (BID) | ORAL | 2 refills | Status: DC

## 2021-03-25 ENCOUNTER — Ambulatory Visit: Payer: 59 | Attending: Orthopaedic Surgery

## 2021-03-25 DIAGNOSIS — M5412 Radiculopathy, cervical region: Secondary | ICD-10-CM | POA: Insufficient documentation

## 2021-03-25 DIAGNOSIS — M542 Cervicalgia: Secondary | ICD-10-CM | POA: Insufficient documentation

## 2021-03-25 DIAGNOSIS — M6281 Muscle weakness (generalized): Secondary | ICD-10-CM

## 2021-03-25 DIAGNOSIS — M545 Low back pain, unspecified: Secondary | ICD-10-CM | POA: Insufficient documentation

## 2021-03-25 DIAGNOSIS — R262 Difficulty in walking, not elsewhere classified: Secondary | ICD-10-CM | POA: Insufficient documentation

## 2021-03-25 NOTE — Addendum Note (Signed)
Addended by: Madaline Savage on: 03/25/2021 06:21 PM   Modules accepted: Orders
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
## 2021-03-27 ENCOUNTER — Ambulatory Visit: Payer: 59

## 2021-03-27 DIAGNOSIS — R262 Difficulty in walking, not elsewhere classified: Secondary | ICD-10-CM

## 2021-03-27 DIAGNOSIS — M6281 Muscle weakness (generalized): Secondary | ICD-10-CM

## 2021-03-27 DIAGNOSIS — M545 Low back pain, unspecified: Secondary | ICD-10-CM | POA: Diagnosis not present
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
## 2021-03-27 NOTE — Patient Instructions (Signed)
Access Code: [email or web address] URL: [email or web address] Date: 03/27/2021 Prepared by: Joneen Boers  Exercises Bent Knee Fallouts - 1 x daily - 7 x weekly - 3 sets - 10 reps Static Prone on Elbows - 2 x daily - 7 x weekly - 1 sets - 2 reps - 2 minutes hold Sidelying Hip Abduction - 1 x daily - 7 x weekly - 2 sets - 10 reps Prone Hip Extension with Bent Knee - 1 x daily - 7 x weekly - 2 sets - 10 reps - 5 seconds hold Standing Anti-Rotation Press with Anchored Resistance - 1 x daily - 7 x weekly - 3 sets - 10 reps - 5 seconds hold Curl Up with Arms Crossed - 1 x daily - 7 x weekly - 3 sets - 10 reps Right Standing Lateral Shift Correction at Wall - Repetitions - 1 x daily - 7 x weekly - 3 sets - 10 reps - 5 seconds hold Standing Lumbar Extension - 1 x daily - 7 x weekly - 3 sets - 10 reps - 5 seconds hold

## 2021-03-30 ENCOUNTER — Emergency Department: Payer: 59

## 2021-03-30 ENCOUNTER — Other Ambulatory Visit: Payer: Self-pay

## 2021-03-30 ENCOUNTER — Emergency Department
Admission: EM | Admit: 2021-03-30 | Discharge: 2021-03-30 | Disposition: A | Discharge status: Home / Self Care | Payer: 59 | Attending: Emergency Medicine | Admitting: Emergency Medicine

## 2021-03-30 DIAGNOSIS — S0081XA Abrasion of other part of head, initial encounter: Secondary | ICD-10-CM | POA: Insufficient documentation

## 2021-03-30 DIAGNOSIS — S199XXA Unspecified injury of neck, initial encounter: Secondary | ICD-10-CM | POA: Diagnosis not present

## 2021-03-30 DIAGNOSIS — S0990XA Unspecified injury of head, initial encounter: Secondary | ICD-10-CM | POA: Diagnosis not present

## 2021-03-30 DIAGNOSIS — M25569 Pain in unspecified knee: Secondary | ICD-10-CM | POA: Diagnosis not present

## 2021-03-30 DIAGNOSIS — M545 Low back pain, unspecified: Secondary | ICD-10-CM | POA: Diagnosis not present

## 2021-03-30 DIAGNOSIS — M79609 Pain in unspecified limb: Secondary | ICD-10-CM

## 2021-03-30 DIAGNOSIS — S161XXA Strain of muscle, fascia and tendon at neck level, initial encounter: Secondary | ICD-10-CM | POA: Diagnosis not present

## 2021-03-30 DIAGNOSIS — S34109A Unspecified injury to unspecified level of lumbar spinal cord, initial encounter: Secondary | ICD-10-CM | POA: Diagnosis present

## 2021-03-30 DIAGNOSIS — S39012A Strain of muscle, fascia and tendon of lower back, initial encounter: Secondary | ICD-10-CM

## 2021-03-30 MED ORDER — CYCLOBENZAPRINE HCL 10 MG PO TABS: 10.0000 mg | ORAL_TABLET | Freq: Three times a day (TID) | ORAL | 0 refills | Status: DC | PRN

## 2021-03-30 MED ORDER — MELOXICAM 15 MG PO TABS: 15.0000 mg | ORAL_TABLET | Freq: Every day | ORAL | 2 refills | Status: DC
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
## 2021-03-30 NOTE — Discharge Instructions (Signed)
Follow-up with regular doctor as needed.  Follow-up with orthopedics if not improving in 1 week.  Use medications as prescribed.  Return if worsening

## 2021-03-31 ENCOUNTER — Ambulatory Visit: Payer: Self-pay | Admitting: Physician Assistant

## 2021-03-31 ENCOUNTER — Encounter: Payer: Self-pay | Admitting: Physician Assistant

## 2021-03-31 DIAGNOSIS — M7918 Myalgia, other site: Secondary | ICD-10-CM
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
## 2021-04-02 ENCOUNTER — Encounter: Payer: Self-pay | Admitting: Physician Assistant

## 2021-04-02 ENCOUNTER — Ambulatory Visit: Payer: 59

## 2021-04-02 ENCOUNTER — Ambulatory Visit: Payer: 59 | Admitting: Physician Assistant

## 2021-04-02 ENCOUNTER — Other Ambulatory Visit: Payer: Self-pay

## 2021-04-02 DIAGNOSIS — M7918 Myalgia, other site: Secondary | ICD-10-CM

## 2021-04-02 DIAGNOSIS — M5412 Radiculopathy, cervical region: Secondary | ICD-10-CM | POA: Insufficient documentation

## 2021-04-02 DIAGNOSIS — M47812 Spondylosis without myelopathy or radiculopathy, cervical region: Secondary | ICD-10-CM | POA: Insufficient documentation

## 2021-04-02 DIAGNOSIS — S161XXA Strain of muscle, fascia and tendon at neck level, initial encounter: Secondary | ICD-10-CM | POA: Diagnosis not present

## 2021-04-02 DIAGNOSIS — M47892 Other spondylosis, cervical region: Secondary | ICD-10-CM | POA: Diagnosis not present

## 2021-04-02 DIAGNOSIS — M542 Cervicalgia: Secondary | ICD-10-CM | POA: Diagnosis not present
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
## 2021-04-08 ENCOUNTER — Ambulatory Visit: Payer: 59

## 2021-04-08 DIAGNOSIS — M6281 Muscle weakness (generalized): Secondary | ICD-10-CM

## 2021-04-08 DIAGNOSIS — M542 Cervicalgia: Secondary | ICD-10-CM

## 2021-04-08 DIAGNOSIS — M545 Low back pain, unspecified: Secondary | ICD-10-CM

## 2021-04-08 DIAGNOSIS — M5412 Radiculopathy, cervical region: Secondary | ICD-10-CM

## 2021-04-08 DIAGNOSIS — R262 Difficulty in walking, not elsewhere classified: Secondary | ICD-10-CM
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
## 2021-04-10 ENCOUNTER — Ambulatory Visit: Payer: 59

## 2021-04-15 ENCOUNTER — Ambulatory Visit: Payer: 59

## 2021-04-16 DIAGNOSIS — M5416 Radiculopathy, lumbar region: Secondary | ICD-10-CM | POA: Diagnosis not present

## 2021-04-16 DIAGNOSIS — M5412 Radiculopathy, cervical region: Secondary | ICD-10-CM | POA: Diagnosis not present

## 2021-04-16 DIAGNOSIS — G5602 Carpal tunnel syndrome, left upper limb: Secondary | ICD-10-CM | POA: Diagnosis not present

## 2021-04-17 ENCOUNTER — Ambulatory Visit: Payer: 59

## 2021-04-17 DIAGNOSIS — M5412 Radiculopathy, cervical region: Secondary | ICD-10-CM

## 2021-04-17 DIAGNOSIS — M542 Cervicalgia: Secondary | ICD-10-CM

## 2021-04-17 DIAGNOSIS — M545 Low back pain, unspecified: Secondary | ICD-10-CM | POA: Diagnosis not present

## 2021-04-17 DIAGNOSIS — M6281 Muscle weakness (generalized): Secondary | ICD-10-CM

## 2021-04-17 NOTE — Patient Instructions (Signed)
Access Code: [email or web address] URL: [email or web address] Date: 04/17/2021 Prepared by: Joneen Boers  Exercises Supine Head Nod Deep Neck Flexor Training - 1 x daily - 7 x weekly - 1 sets - 3 reps - 1 minute hold Supine Cervical Rotation AROM on Pillow - 1 x daily - 7 x weekly - 1 sets - 3 reps - 1 minute hold Supine Transversus Abdominis Bracing - Hands on Stomach - 1 x daily - 7 x weekly - 3 sets - 10 reps - 5 seconds hold
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
## 2021-04-18 ENCOUNTER — Other Ambulatory Visit: Payer: Self-pay

## 2021-04-18 DIAGNOSIS — A6000 Herpesviral infection of urogenital system, unspecified: Secondary | ICD-10-CM

## 2021-04-18 MED ORDER — VALACYCLOVIR HCL 500 MG PO TABS
500.0000 mg | ORAL_TABLET | Freq: Two times a day (BID) | ORAL | 2 refills | Status: DC
Start: 2021-04-18 — End: 2021-08-25

## 2021-04-22 ENCOUNTER — Ambulatory Visit: Payer: 59 | Attending: Orthopaedic Surgery

## 2021-04-22 DIAGNOSIS — M6281 Muscle weakness (generalized): Secondary | ICD-10-CM | POA: Diagnosis not present

## 2021-04-22 DIAGNOSIS — R262 Difficulty in walking, not elsewhere classified: Secondary | ICD-10-CM | POA: Diagnosis not present

## 2021-04-22 DIAGNOSIS — M545 Low back pain, unspecified: Secondary | ICD-10-CM | POA: Insufficient documentation

## 2021-04-22 DIAGNOSIS — M542 Cervicalgia: Secondary | ICD-10-CM | POA: Diagnosis not present

## 2021-04-22 DIAGNOSIS — M5412 Radiculopathy, cervical region: Secondary | ICD-10-CM | POA: Diagnosis not present
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
## 2021-04-22 NOTE — Patient Instructions (Signed)
Access Code: [email or web address] URL: [email or web address] Date: 04/22/2021 Prepared by: Joneen Boers  Exercises Supine Head Nod Deep Neck Flexor Training - 1 x daily - 7 x weekly - 1 sets - 3 reps - 1 minute hold Supine Cervical Rotation AROM on Pillow - 1 x daily - 7 x weekly - 1 sets - 3 reps - 1 minute hold Supine Transversus Abdominis Bracing - Hands on Stomach - 1 x daily - 7 x weekly - 3 sets - 10 reps - 5 seconds hold Seated Cervical Retraction and Extension - 1 x daily - 7 x weekly - 3 sets - 10 reps - 5 seconds hold

## 2021-04-24 ENCOUNTER — Ambulatory Visit: Payer: 59

## 2021-04-28 ENCOUNTER — Ambulatory Visit: Payer: 59

## 2021-04-28 DIAGNOSIS — M6281 Muscle weakness (generalized): Secondary | ICD-10-CM

## 2021-04-28 DIAGNOSIS — M5412 Radiculopathy, cervical region: Secondary | ICD-10-CM

## 2021-04-28 DIAGNOSIS — M542 Cervicalgia: Secondary | ICD-10-CM | POA: Diagnosis not present

## 2021-04-28 DIAGNOSIS — M545 Low back pain, unspecified: Secondary | ICD-10-CM | POA: Diagnosis not present

## 2021-04-28 DIAGNOSIS — R262 Difficulty in walking, not elsewhere classified: Secondary | ICD-10-CM | POA: Diagnosis not present
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
## 2021-05-01 ENCOUNTER — Ambulatory Visit: Payer: Self-pay | Admitting: Physician Assistant

## 2021-05-01 ENCOUNTER — Encounter: Payer: Self-pay | Admitting: Physician Assistant

## 2021-05-01 ENCOUNTER — Other Ambulatory Visit: Payer: Self-pay

## 2021-05-01 VITALS — BP 127/89 | HR 80 | Temp 98.2°F | Resp 14

## 2021-05-01 DIAGNOSIS — S0990XA Unspecified injury of head, initial encounter: Secondary | ICD-10-CM

## 2021-05-01 DIAGNOSIS — J302 Other seasonal allergic rhinitis: Secondary | ICD-10-CM

## 2021-05-01 DIAGNOSIS — K219 Gastro-esophageal reflux disease without esophagitis: Secondary | ICD-10-CM

## 2021-05-01 NOTE — Progress Notes (Signed)
Hit left side of forehead on metal part of a hose.  The hose was attached to the wall.  Was rolling up another hosAbe & when he straightened himself up, he hit his head.  Feels light headed, sensitive to light & a headache. States feels "off balance" when walking.  Didn't black out "Felt like when you hit your elbow (funny bone)"  Occurred at 12:40 pm.  Has not eaten since occurrence. Able to drink po fluids Denies nausea  AMD

## 2021-05-01 NOTE — Progress Notes (Signed)
   Subjective: Scalp contusion    Patient ID: OLLIVANDER SEE, male    DOB: Sep 21, 1985, 35 y.o.   MRN: [id number]  HPI Patient complain contusion to the left frontal scalp by the end of a hose.  Patient denies LOC.  Patient denies vision disturbance or weakness.  Patient state he feels "off center".  Denies vomiting.  States mild vertigo.  States took ibuprofen.  Can tolerate fluids.   Review of Systems Negative septal complaint    Objective:   Physical Exam  Appears to be in no acute distress.  Temperature 98.2, pulse 80, respiration 14, BP is 127/89, patient 95% O2 sat on room air. HEENT is unremarkable with negative funduscopic exam.  Neck is supple.  Lungs are clear to auscultation.  Heart regular rate and rhythm. Skin no obvious abrasion or ecchymosis at head impact area.      Assessment & Plan: Minor head injury.   Patient given head injury precautions.  Patient advised to go to the emergency room for definitive evaluation if condition worsens.  Advised Tylenol start ibuprofen for headache/pain.  Patient given a work Psychologist, occupational for for today and tomorrow.

## 2021-05-02 MED ORDER — OMEPRAZOLE 40 MG PO CPDR: 40.0000 mg | DELAYED_RELEASE_CAPSULE | Freq: Every day | ORAL | 3 refills | Status: DC

## 2021-05-02 MED ORDER — FLUTICASONE PROPIONATE 50 MCG/ACT NA SUSP: 2.0000 | Freq: Every day | NASAL | 6 refills | Status: AC

## 2021-05-06 ENCOUNTER — Other Ambulatory Visit: Payer: Self-pay

## 2021-05-06 ENCOUNTER — Encounter: Payer: Self-pay | Admitting: Physician Assistant

## 2021-05-06 ENCOUNTER — Ambulatory Visit: Payer: Self-pay | Admitting: Physician Assistant

## 2021-05-06 VITALS — BP 136/97 | HR 70 | Temp 98.4°F | Resp 14 | Ht 67.0 in | Wt 190.0 lb

## 2021-05-06 DIAGNOSIS — S0990XA Unspecified injury of head, initial encounter: Secondary | ICD-10-CM

## 2021-05-06 NOTE — Progress Notes (Signed)
+    Subjective: Head injury    Patient ID: Thomas Mckay, male    DOB: 10/31/1985, 35 y.o.   MRN: [id number]  HPI Follow-up minor head injury which occurred 5 days ago.  Patient states he is experiencing photophobia and headache.  Patient states he returned back to work yesterday and believe his condition worsens.  Denies other vision disturbance.  Denies vertigo, denies nausea, vomiting, or weakness.   Review of Systems Negative except for complaint.    Objective:   Physical Exam  No acute distress.  Normal gait and posture.  See nurses note for vital signs. HEENT is unremarkable.  Patient is able to tolerate ophthalmic exam.  No facial edema or ecchymosis.  Neck is supple     Assessment & Plan: Minor head injury.   Advised patient no findings to indicate intracranial abnormalities.    No need for any advanced imaging based on physical exam..  Patient does have the option to go to the emergency room to request CAT scan if he believes his condition is worsening.

## 2021-05-06 NOTE — Progress Notes (Signed)
Pt states he's been getting head aches 2-3 times a day and having sensitivity to light./CL,RMA

## 2021-05-07 ENCOUNTER — Ambulatory Visit: Payer: 59

## 2021-05-07 DIAGNOSIS — M545 Low back pain, unspecified: Secondary | ICD-10-CM

## 2021-05-07 DIAGNOSIS — M5412 Radiculopathy, cervical region: Secondary | ICD-10-CM | POA: Diagnosis not present

## 2021-05-07 DIAGNOSIS — M6281 Muscle weakness (generalized): Secondary | ICD-10-CM | POA: Diagnosis not present

## 2021-05-07 DIAGNOSIS — R262 Difficulty in walking, not elsewhere classified: Secondary | ICD-10-CM | POA: Diagnosis not present

## 2021-05-07 DIAGNOSIS — M542 Cervicalgia: Secondary | ICD-10-CM | POA: Diagnosis not present
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
## 2021-05-08 ENCOUNTER — Ambulatory Visit: Payer: Self-pay | Admitting: Physician Assistant

## 2021-05-08 ENCOUNTER — Other Ambulatory Visit: Payer: Self-pay

## 2021-05-08 ENCOUNTER — Encounter: Payer: Self-pay | Admitting: Physician Assistant

## 2021-05-08 VITALS — BP 144/98 | HR 91 | Temp 98.2°F | Resp 14 | Ht 67.0 in | Wt 190.0 lb

## 2021-05-08 DIAGNOSIS — M79604 Pain in right leg: Secondary | ICD-10-CM | POA: Diagnosis not present

## 2021-05-08 DIAGNOSIS — S0990XD Unspecified injury of head, subsequent encounter: Secondary | ICD-10-CM
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
## 2021-05-08 NOTE — Progress Notes (Signed)
   Subjective: Minor head injury    Patient ID: Thomas Mckay, male    DOB: Sep 13, 1985, 35 y.o.   MRN: [id number]  HPI Patient here for reevaluation of headache secondary to head contusion on 05/01/2021.  Patient also states intermittent pain in the right lower leg and back secondary to the previous work-related injury.  Patient also voiced concern for DVT of the right leg.  Patient states right calf pain which is worse at night.  Describes the pain as "sore".  Mild transient relief with heat.  Patient denies dyspnea or chest pain.  Patient stated he was advised by [facility] yesterday to go to the emergency room if condition worsens.  Advised patient today's evaluation will be only for the head contusion.  Continue to follow-up with [facility] for previous work-related injury.   Review of Systems Allergic rhinitis, GERD, and myalgia.    Objective:   Physical Exam  No acute distress.  Patient is alert and oriented x3.  Temperature 98.2, pulse 91, respiration 14, BP is 148/98, patient 97% O2 sat on room air.  Patient was 190 pounds and BMI is 29.76. HEENT is unremarkable.PERRL/EOM intact.  Facial area reveals no abrasion, edema, or ecchymosis.  Nontender to palpation at impact site. Examination of the right calf reveals no edema, erythema, or ecchymosis.  Patient has varicose veins medial aspect of the right distal thigh.     Assessment & Plan: Minor head injury.   Advised patient that he is now 1 week status post head contusion with no sequela.  May return back to duty and continue restrictions given by [employer].  Advised only Tylenol as needed for headache.

## 2021-05-13 ENCOUNTER — Ambulatory Visit: Payer: 59

## 2021-05-13 DIAGNOSIS — M5412 Radiculopathy, cervical region: Secondary | ICD-10-CM | POA: Diagnosis not present

## 2021-05-13 DIAGNOSIS — R262 Difficulty in walking, not elsewhere classified: Secondary | ICD-10-CM | POA: Diagnosis not present

## 2021-05-13 DIAGNOSIS — M545 Low back pain, unspecified: Secondary | ICD-10-CM

## 2021-05-13 DIAGNOSIS — M6281 Muscle weakness (generalized): Secondary | ICD-10-CM

## 2021-05-13 DIAGNOSIS — M542 Cervicalgia: Secondary | ICD-10-CM | POA: Diagnosis not present
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
## 2021-05-15 ENCOUNTER — Ambulatory Visit: Payer: 59

## 2021-05-15 DIAGNOSIS — M6281 Muscle weakness (generalized): Secondary | ICD-10-CM

## 2021-05-15 DIAGNOSIS — M5412 Radiculopathy, cervical region: Secondary | ICD-10-CM | POA: Diagnosis not present

## 2021-05-15 DIAGNOSIS — R262 Difficulty in walking, not elsewhere classified: Secondary | ICD-10-CM | POA: Diagnosis not present

## 2021-05-15 DIAGNOSIS — M545 Low back pain, unspecified: Secondary | ICD-10-CM

## 2021-05-15 DIAGNOSIS — M542 Cervicalgia: Secondary | ICD-10-CM | POA: Diagnosis not present
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
## 2021-05-20 ENCOUNTER — Ambulatory Visit: Payer: 59 | Attending: Orthopaedic Surgery

## 2021-05-20 DIAGNOSIS — M545 Low back pain, unspecified: Secondary | ICD-10-CM | POA: Insufficient documentation

## 2021-05-20 DIAGNOSIS — M542 Cervicalgia: Secondary | ICD-10-CM | POA: Diagnosis not present

## 2021-05-20 DIAGNOSIS — R262 Difficulty in walking, not elsewhere classified: Secondary | ICD-10-CM | POA: Insufficient documentation

## 2021-05-20 DIAGNOSIS — M6281 Muscle weakness (generalized): Secondary | ICD-10-CM | POA: Insufficient documentation

## 2021-05-20 DIAGNOSIS — M5412 Radiculopathy, cervical region: Secondary | ICD-10-CM | POA: Diagnosis not present
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
## 2021-05-22 ENCOUNTER — Ambulatory Visit: Payer: 59

## 2021-05-22 DIAGNOSIS — R262 Difficulty in walking, not elsewhere classified: Secondary | ICD-10-CM | POA: Diagnosis not present

## 2021-05-22 DIAGNOSIS — M542 Cervicalgia: Secondary | ICD-10-CM | POA: Diagnosis not present

## 2021-05-22 DIAGNOSIS — M545 Low back pain, unspecified: Secondary | ICD-10-CM

## 2021-05-22 DIAGNOSIS — M5412 Radiculopathy, cervical region: Secondary | ICD-10-CM | POA: Diagnosis not present

## 2021-05-22 DIAGNOSIS — M6281 Muscle weakness (generalized): Secondary | ICD-10-CM | POA: Diagnosis not present
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
## 2021-05-26 DIAGNOSIS — M5416 Radiculopathy, lumbar region: Secondary | ICD-10-CM | POA: Diagnosis not present

## 2021-05-26 DIAGNOSIS — M5412 Radiculopathy, cervical region: Secondary | ICD-10-CM | POA: Diagnosis not present

## 2021-05-27 ENCOUNTER — Ambulatory Visit: Payer: 59

## 2021-05-28 ENCOUNTER — Ambulatory Visit: Payer: 59

## 2021-05-29 ENCOUNTER — Ambulatory Visit: Payer: 59

## 2021-05-29 DIAGNOSIS — M545 Low back pain, unspecified: Secondary | ICD-10-CM | POA: Diagnosis not present

## 2021-05-29 DIAGNOSIS — M542 Cervicalgia: Secondary | ICD-10-CM

## 2021-05-29 DIAGNOSIS — M5412 Radiculopathy, cervical region: Secondary | ICD-10-CM

## 2021-05-29 DIAGNOSIS — M6281 Muscle weakness (generalized): Secondary | ICD-10-CM | POA: Diagnosis not present

## 2021-05-29 DIAGNOSIS — R262 Difficulty in walking, not elsewhere classified: Secondary | ICD-10-CM | POA: Diagnosis not present
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
## 2021-06-03 ENCOUNTER — Ambulatory Visit: Payer: 59

## 2021-06-03 DIAGNOSIS — M542 Cervicalgia: Secondary | ICD-10-CM | POA: Diagnosis not present

## 2021-06-03 DIAGNOSIS — M5412 Radiculopathy, cervical region: Secondary | ICD-10-CM | POA: Diagnosis not present

## 2021-06-03 DIAGNOSIS — R262 Difficulty in walking, not elsewhere classified: Secondary | ICD-10-CM | POA: Diagnosis not present

## 2021-06-03 DIAGNOSIS — M6281 Muscle weakness (generalized): Secondary | ICD-10-CM | POA: Diagnosis not present

## 2021-06-03 DIAGNOSIS — M545 Low back pain, unspecified: Secondary | ICD-10-CM
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
## 2021-06-04 DIAGNOSIS — M5416 Radiculopathy, lumbar region: Secondary | ICD-10-CM | POA: Diagnosis not present

## 2021-06-05 ENCOUNTER — Ambulatory Visit: Payer: 59

## 2021-06-05 DIAGNOSIS — M545 Low back pain, unspecified: Secondary | ICD-10-CM

## 2021-06-05 DIAGNOSIS — M542 Cervicalgia: Secondary | ICD-10-CM | POA: Diagnosis not present

## 2021-06-05 DIAGNOSIS — M5412 Radiculopathy, cervical region: Secondary | ICD-10-CM | POA: Diagnosis not present

## 2021-06-05 DIAGNOSIS — R262 Difficulty in walking, not elsewhere classified: Secondary | ICD-10-CM | POA: Diagnosis not present

## 2021-06-05 DIAGNOSIS — M6281 Muscle weakness (generalized): Secondary | ICD-10-CM
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
## 2021-06-06 DIAGNOSIS — M5416 Radiculopathy, lumbar region: Secondary | ICD-10-CM | POA: Diagnosis not present

## 2021-06-10 ENCOUNTER — Ambulatory Visit: Payer: 59

## 2021-06-10 DIAGNOSIS — M6281 Muscle weakness (generalized): Secondary | ICD-10-CM | POA: Diagnosis not present

## 2021-06-10 DIAGNOSIS — M545 Low back pain, unspecified: Secondary | ICD-10-CM | POA: Diagnosis not present

## 2021-06-10 DIAGNOSIS — M5412 Radiculopathy, cervical region: Secondary | ICD-10-CM | POA: Diagnosis not present

## 2021-06-10 DIAGNOSIS — R262 Difficulty in walking, not elsewhere classified: Secondary | ICD-10-CM

## 2021-06-10 DIAGNOSIS — M542 Cervicalgia: Secondary | ICD-10-CM | POA: Diagnosis not present
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
## 2021-06-17 ENCOUNTER — Other Ambulatory Visit: Payer: Self-pay

## 2021-06-17 ENCOUNTER — Ambulatory Visit: Payer: 59

## 2021-06-17 DIAGNOSIS — M6281 Muscle weakness (generalized): Secondary | ICD-10-CM

## 2021-06-17 DIAGNOSIS — M5412 Radiculopathy, cervical region: Secondary | ICD-10-CM | POA: Diagnosis not present

## 2021-06-17 DIAGNOSIS — R262 Difficulty in walking, not elsewhere classified: Secondary | ICD-10-CM

## 2021-06-17 DIAGNOSIS — M545 Low back pain, unspecified: Secondary | ICD-10-CM

## 2021-06-17 DIAGNOSIS — M542 Cervicalgia: Secondary | ICD-10-CM
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
## 2021-06-19 ENCOUNTER — Ambulatory Visit: Payer: 59 | Attending: Orthopaedic Surgery

## 2021-06-19 DIAGNOSIS — M5412 Radiculopathy, cervical region: Secondary | ICD-10-CM | POA: Diagnosis not present

## 2021-06-19 DIAGNOSIS — M545 Low back pain, unspecified: Secondary | ICD-10-CM | POA: Diagnosis not present

## 2021-06-19 DIAGNOSIS — M542 Cervicalgia: Secondary | ICD-10-CM | POA: Insufficient documentation

## 2021-06-19 DIAGNOSIS — R262 Difficulty in walking, not elsewhere classified: Secondary | ICD-10-CM | POA: Diagnosis not present

## 2021-06-19 DIAGNOSIS — M6281 Muscle weakness (generalized): Secondary | ICD-10-CM | POA: Diagnosis not present
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
## 2021-06-24 ENCOUNTER — Ambulatory Visit: Payer: 59

## 2021-06-25 ENCOUNTER — Ambulatory Visit: Payer: 59

## 2021-06-25 DIAGNOSIS — M545 Low back pain, unspecified: Secondary | ICD-10-CM

## 2021-06-25 DIAGNOSIS — M5412 Radiculopathy, cervical region: Secondary | ICD-10-CM | POA: Diagnosis not present

## 2021-06-25 DIAGNOSIS — M542 Cervicalgia: Secondary | ICD-10-CM | POA: Diagnosis not present

## 2021-06-25 DIAGNOSIS — R262 Difficulty in walking, not elsewhere classified: Secondary | ICD-10-CM

## 2021-06-25 DIAGNOSIS — M6281 Muscle weakness (generalized): Secondary | ICD-10-CM
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
## 2021-06-26 ENCOUNTER — Ambulatory Visit: Payer: 59

## 2021-06-26 DIAGNOSIS — R262 Difficulty in walking, not elsewhere classified: Secondary | ICD-10-CM

## 2021-06-26 DIAGNOSIS — M545 Low back pain, unspecified: Secondary | ICD-10-CM

## 2021-06-26 DIAGNOSIS — M542 Cervicalgia: Secondary | ICD-10-CM | POA: Diagnosis not present

## 2021-06-26 DIAGNOSIS — M6281 Muscle weakness (generalized): Secondary | ICD-10-CM

## 2021-06-26 DIAGNOSIS — M5412 Radiculopathy, cervical region: Secondary | ICD-10-CM | POA: Diagnosis not present
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
## 2021-07-01 ENCOUNTER — Ambulatory Visit: Payer: 59

## 2021-07-01 DIAGNOSIS — R262 Difficulty in walking, not elsewhere classified: Secondary | ICD-10-CM

## 2021-07-01 DIAGNOSIS — M5412 Radiculopathy, cervical region: Secondary | ICD-10-CM

## 2021-07-01 DIAGNOSIS — M6281 Muscle weakness (generalized): Secondary | ICD-10-CM

## 2021-07-01 DIAGNOSIS — M545 Low back pain, unspecified: Secondary | ICD-10-CM

## 2021-07-01 DIAGNOSIS — M542 Cervicalgia: Secondary | ICD-10-CM
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
## 2021-07-02 ENCOUNTER — Ambulatory Visit: Payer: 59

## 2021-07-02 DIAGNOSIS — M545 Low back pain, unspecified: Secondary | ICD-10-CM

## 2021-07-02 DIAGNOSIS — M6281 Muscle weakness (generalized): Secondary | ICD-10-CM

## 2021-07-02 DIAGNOSIS — R262 Difficulty in walking, not elsewhere classified: Secondary | ICD-10-CM | POA: Diagnosis not present

## 2021-07-02 DIAGNOSIS — M5412 Radiculopathy, cervical region: Secondary | ICD-10-CM | POA: Diagnosis not present

## 2021-07-02 DIAGNOSIS — M542 Cervicalgia: Secondary | ICD-10-CM | POA: Diagnosis not present
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
## 2021-07-02 NOTE — Patient Instructions (Signed)
Access Code: [email or web address] URL: [email or web address] Date: 07/02/2021 Prepared by: Joneen Boers  Exercises Supine Head Nod Deep Neck Flexor Training - 1 x daily - 7 x weekly - 1 sets - 3 reps - 1 minute hold Supine Cervical Rotation AROM on Pillow - 1 x daily - 7 x weekly - 1 sets - 3 reps - 1 minute hold Supine Transversus Abdominis Bracing - Hands on Stomach - 1 x daily - 7 x weekly - 3 sets - 10 reps - 5 seconds hold Seated Cervical Retraction and Extension - 1 x daily - 7 x weekly - 3 sets - 10 reps - 5 seconds hold Standing Lumbar Extension - 1 x daily - 7 x weekly - 3 sets - 10 reps - 5 seconds hold Right Standing Lateral Shift Correction at Wall - Repetitions - 1 x daily - 7 x weekly - 3 sets - 10 reps - 5 seconds hold Sidelying Hip Abduction - 1 x daily - 7 x weekly - 2 sets - 10 reps Shoulder Extension with Resistance - 1 x daily - 7 x weekly - 3 sets - 10 reps - 5 seconds hold

## 2021-07-03 ENCOUNTER — Ambulatory Visit: Payer: 59

## 2021-07-03 DIAGNOSIS — M25661 Stiffness of right knee, not elsewhere classified: Secondary | ICD-10-CM | POA: Insufficient documentation

## 2021-07-08 ENCOUNTER — Ambulatory Visit: Payer: 59

## 2021-07-08 DIAGNOSIS — R262 Difficulty in walking, not elsewhere classified: Secondary | ICD-10-CM | POA: Diagnosis not present

## 2021-07-08 DIAGNOSIS — M545 Low back pain, unspecified: Secondary | ICD-10-CM | POA: Diagnosis not present

## 2021-07-08 DIAGNOSIS — M6281 Muscle weakness (generalized): Secondary | ICD-10-CM | POA: Diagnosis not present

## 2021-07-08 DIAGNOSIS — M542 Cervicalgia: Secondary | ICD-10-CM | POA: Diagnosis not present

## 2021-07-08 DIAGNOSIS — M5412 Radiculopathy, cervical region: Secondary | ICD-10-CM | POA: Diagnosis not present
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
## 2021-07-09 ENCOUNTER — Ambulatory Visit: Payer: 59

## 2021-07-09 DIAGNOSIS — M5412 Radiculopathy, cervical region: Secondary | ICD-10-CM | POA: Diagnosis not present

## 2021-07-09 DIAGNOSIS — M542 Cervicalgia: Secondary | ICD-10-CM | POA: Diagnosis not present

## 2021-07-09 DIAGNOSIS — M6281 Muscle weakness (generalized): Secondary | ICD-10-CM

## 2021-07-09 DIAGNOSIS — M545 Low back pain, unspecified: Secondary | ICD-10-CM

## 2021-07-09 DIAGNOSIS — R262 Difficulty in walking, not elsewhere classified: Secondary | ICD-10-CM | POA: Diagnosis not present
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
## 2021-07-10 ENCOUNTER — Ambulatory Visit: Payer: 59

## 2021-07-10 DIAGNOSIS — R202 Paresthesia of skin: Secondary | ICD-10-CM | POA: Diagnosis not present

## 2021-07-15 ENCOUNTER — Telehealth: Payer: Self-pay

## 2021-07-15 ENCOUNTER — Ambulatory Visit: Payer: 59

## 2021-07-15 DIAGNOSIS — Z20822 Contact with and (suspected) exposure to covid-19: Secondary | ICD-10-CM | POA: Diagnosis not present

## 2021-07-15 NOTE — Telephone Encounter (Signed)
No show. Called patient and left a message pertaining to appointment and a reminder for the next follow up session. Return phone call requested. Phone number ([phone]) provided.   

## 2021-07-16 ENCOUNTER — Telehealth: Payer: Self-pay

## 2021-07-16 NOTE — Telephone Encounter (Signed)
Thomas Mckay called to report [facility] Covid test + on 07/15/2021.  S/Sx started 07/13/2021: Temp 100 F - Resolved (Back to Normal) Headache - Resolved Runny Nose Cough  States he's already notified Marilynne Halsted & Tonye Royalty (Supervisors).  Advised to treat S/Sx with OTC cold meds. If S/Sx worsen call us back.  AMD

## 2021-07-17 ENCOUNTER — Ambulatory Visit: Payer: 59

## 2021-07-23 ENCOUNTER — Ambulatory Visit: Payer: 59 | Attending: Orthopaedic Surgery

## 2021-07-23 DIAGNOSIS — R262 Difficulty in walking, not elsewhere classified: Secondary | ICD-10-CM | POA: Insufficient documentation

## 2021-07-23 DIAGNOSIS — M542 Cervicalgia: Secondary | ICD-10-CM | POA: Diagnosis not present

## 2021-07-23 DIAGNOSIS — M545 Low back pain, unspecified: Secondary | ICD-10-CM | POA: Insufficient documentation

## 2021-07-23 DIAGNOSIS — M5412 Radiculopathy, cervical region: Secondary | ICD-10-CM | POA: Insufficient documentation

## 2021-07-23 DIAGNOSIS — M6281 Muscle weakness (generalized): Secondary | ICD-10-CM | POA: Insufficient documentation
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
## 2021-07-29 ENCOUNTER — Ambulatory Visit: Payer: 59

## 2021-07-29 DIAGNOSIS — M6281 Muscle weakness (generalized): Secondary | ICD-10-CM

## 2021-07-29 DIAGNOSIS — M5412 Radiculopathy, cervical region: Secondary | ICD-10-CM | POA: Diagnosis not present

## 2021-07-29 DIAGNOSIS — M542 Cervicalgia: Secondary | ICD-10-CM

## 2021-07-29 DIAGNOSIS — M545 Low back pain, unspecified: Secondary | ICD-10-CM | POA: Diagnosis not present

## 2021-07-29 DIAGNOSIS — R262 Difficulty in walking, not elsewhere classified: Secondary | ICD-10-CM | POA: Diagnosis not present
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
## 2021-08-01 DIAGNOSIS — M25661 Stiffness of right knee, not elsewhere classified: Secondary | ICD-10-CM | POA: Diagnosis not present

## 2021-08-01 DIAGNOSIS — M25561 Pain in right knee: Secondary | ICD-10-CM | POA: Diagnosis not present

## 2021-08-05 ENCOUNTER — Ambulatory Visit: Payer: 59

## 2021-08-05 DIAGNOSIS — M542 Cervicalgia: Secondary | ICD-10-CM | POA: Diagnosis not present

## 2021-08-05 DIAGNOSIS — M6281 Muscle weakness (generalized): Secondary | ICD-10-CM

## 2021-08-05 DIAGNOSIS — M5412 Radiculopathy, cervical region: Secondary | ICD-10-CM | POA: Diagnosis not present

## 2021-08-05 DIAGNOSIS — R262 Difficulty in walking, not elsewhere classified: Secondary | ICD-10-CM | POA: Diagnosis not present

## 2021-08-05 DIAGNOSIS — M545 Low back pain, unspecified: Secondary | ICD-10-CM | POA: Diagnosis not present
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
## 2021-08-06 DIAGNOSIS — M25661 Stiffness of right knee, not elsewhere classified: Secondary | ICD-10-CM | POA: Diagnosis not present

## 2021-08-06 DIAGNOSIS — M25561 Pain in right knee: Secondary | ICD-10-CM | POA: Diagnosis not present

## 2021-08-07 ENCOUNTER — Ambulatory Visit: Payer: 59

## 2021-08-07 DIAGNOSIS — M545 Low back pain, unspecified: Secondary | ICD-10-CM | POA: Diagnosis not present

## 2021-08-07 DIAGNOSIS — M5412 Radiculopathy, cervical region: Secondary | ICD-10-CM | POA: Diagnosis not present

## 2021-08-07 DIAGNOSIS — M542 Cervicalgia: Secondary | ICD-10-CM

## 2021-08-07 DIAGNOSIS — M6281 Muscle weakness (generalized): Secondary | ICD-10-CM

## 2021-08-07 DIAGNOSIS — R262 Difficulty in walking, not elsewhere classified: Secondary | ICD-10-CM | POA: Diagnosis not present
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
## 2021-08-08 DIAGNOSIS — M25561 Pain in right knee: Secondary | ICD-10-CM | POA: Diagnosis not present

## 2021-08-08 DIAGNOSIS — M25661 Stiffness of right knee, not elsewhere classified: Secondary | ICD-10-CM | POA: Diagnosis not present

## 2021-08-12 ENCOUNTER — Ambulatory Visit: Payer: 59

## 2021-08-12 DIAGNOSIS — M6281 Muscle weakness (generalized): Secondary | ICD-10-CM

## 2021-08-12 DIAGNOSIS — M542 Cervicalgia: Secondary | ICD-10-CM

## 2021-08-12 DIAGNOSIS — M545 Low back pain, unspecified: Secondary | ICD-10-CM | POA: Diagnosis not present

## 2021-08-12 DIAGNOSIS — R262 Difficulty in walking, not elsewhere classified: Secondary | ICD-10-CM | POA: Diagnosis not present

## 2021-08-12 DIAGNOSIS — M5412 Radiculopathy, cervical region: Secondary | ICD-10-CM | POA: Diagnosis not present
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
## 2021-08-13 DIAGNOSIS — M25661 Stiffness of right knee, not elsewhere classified: Secondary | ICD-10-CM | POA: Diagnosis not present

## 2021-08-13 DIAGNOSIS — M25561 Pain in right knee: Secondary | ICD-10-CM | POA: Diagnosis not present

## 2021-08-14 ENCOUNTER — Other Ambulatory Visit: Payer: Self-pay

## 2021-08-14 ENCOUNTER — Ambulatory Visit: Payer: 59

## 2021-08-14 DIAGNOSIS — M545 Low back pain, unspecified: Secondary | ICD-10-CM | POA: Diagnosis not present

## 2021-08-14 DIAGNOSIS — M542 Cervicalgia: Secondary | ICD-10-CM | POA: Diagnosis not present

## 2021-08-14 DIAGNOSIS — M6281 Muscle weakness (generalized): Secondary | ICD-10-CM

## 2021-08-14 DIAGNOSIS — M5412 Radiculopathy, cervical region: Secondary | ICD-10-CM | POA: Diagnosis not present

## 2021-08-14 DIAGNOSIS — R262 Difficulty in walking, not elsewhere classified: Secondary | ICD-10-CM | POA: Diagnosis not present
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
## 2021-08-19 ENCOUNTER — Ambulatory Visit: Payer: 59

## 2021-08-19 ENCOUNTER — Other Ambulatory Visit: Payer: Self-pay

## 2021-08-19 ENCOUNTER — Encounter: Payer: Self-pay | Admitting: Physician Assistant

## 2021-08-19 ENCOUNTER — Ambulatory Visit: Payer: Self-pay | Admitting: Physician Assistant

## 2021-08-19 VITALS — BP 138/87 | HR 73 | Temp 98.2°F | Resp 14 | Ht 67.0 in | Wt 193.0 lb

## 2021-08-19 DIAGNOSIS — M6281 Muscle weakness (generalized): Secondary | ICD-10-CM

## 2021-08-19 DIAGNOSIS — R262 Difficulty in walking, not elsewhere classified: Secondary | ICD-10-CM | POA: Diagnosis not present

## 2021-08-19 DIAGNOSIS — M5412 Radiculopathy, cervical region: Secondary | ICD-10-CM | POA: Diagnosis not present

## 2021-08-19 DIAGNOSIS — M545 Low back pain, unspecified: Secondary | ICD-10-CM | POA: Diagnosis not present

## 2021-08-19 DIAGNOSIS — M542 Cervicalgia: Secondary | ICD-10-CM | POA: Diagnosis not present

## 2021-08-19 DIAGNOSIS — H5789 Other specified disorders of eye and adnexa: Secondary | ICD-10-CM

## 2021-08-19 NOTE — Progress Notes (Signed)
Pt has been feeling like something is in his eye since Sunday. Started off in left eye then went to right eye./CL,RMA
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
## 2021-08-19 NOTE — Progress Notes (Signed)
° °  Subjective:    Patient ID: Thomas Mckay, male    DOB: 1986-01-20, 36 y.o.   MRN: [id number]  HPI  36 yo M known to clinic - presents reporting sensation of FB left eye 2 days ago Used eyedrops-- type/ingredients/name unknown The next day used an "eyewash" - type /name and ingredients unknown Did not bring to appointmenr  Feels much better but "wants a check" Denies all discomfort at this time  Presents today for evaluation Works in the [employer], systems- cats and dogs Also has cats and dogs at [employer] with them on the couch They frequently sleep there as well  Review of Systems   Has seasonal allergies- intermittently uses girlfriends [place] Objective:   Physical Exam  20/20 Visual fields Dos not wear glasses/contacts  He denies visual changes No squinting, no photophobia No drainage or discharge from either eye Lashes clean and dry No tearing  No FB sensation today Eyes are clear, lids fully open  No swelling , no peripheral erythema Conjunctiva clear EOMI, no injection   No indication for FB or staining      Assessment & Plan:  Eye exam and visuals grossly WNL  Encouraged hands off approach Do not self medicate without knowing product using Bring personal medications to visits  He continues to question products he has at home. Will need to identify  Recommend careful handwashing- cat and dog hair likely in sofa as well Given 5 disposable P[place] sterile saline for future use if needed To RTC if his concerns re-present

## 2021-08-21 ENCOUNTER — Other Ambulatory Visit: Payer: Self-pay

## 2021-08-21 ENCOUNTER — Ambulatory Visit: Payer: 59 | Attending: Orthopaedic Surgery

## 2021-08-21 DIAGNOSIS — M545 Low back pain, unspecified: Secondary | ICD-10-CM | POA: Insufficient documentation

## 2021-08-21 DIAGNOSIS — M6281 Muscle weakness (generalized): Secondary | ICD-10-CM | POA: Diagnosis not present

## 2021-08-21 DIAGNOSIS — M542 Cervicalgia: Secondary | ICD-10-CM | POA: Insufficient documentation
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
## 2021-08-25 ENCOUNTER — Other Ambulatory Visit: Payer: Self-pay

## 2021-08-25 DIAGNOSIS — A6000 Herpesviral infection of urogenital system, unspecified: Secondary | ICD-10-CM

## 2021-08-25 MED ORDER — VALACYCLOVIR HCL 500 MG PO TABS: 500.0000 mg | ORAL_TABLET | Freq: Two times a day (BID) | ORAL | 5 refills | Status: DC

## 2021-08-26 ENCOUNTER — Other Ambulatory Visit: Payer: Self-pay

## 2021-08-26 ENCOUNTER — Ambulatory Visit: Payer: 59

## 2021-08-26 DIAGNOSIS — M6281 Muscle weakness (generalized): Secondary | ICD-10-CM

## 2021-08-26 DIAGNOSIS — M542 Cervicalgia: Secondary | ICD-10-CM | POA: Diagnosis not present

## 2021-08-26 DIAGNOSIS — M545 Low back pain, unspecified: Secondary | ICD-10-CM
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
## 2021-08-27 ENCOUNTER — Encounter: Payer: Self-pay | Admitting: Physician Assistant

## 2021-08-27 ENCOUNTER — Ambulatory Visit: Payer: Self-pay | Admitting: Physician Assistant

## 2021-08-27 VITALS — BP 128/93 | HR 73 | Temp 98.6°F | Resp 14 | Ht 67.0 in | Wt 193.0 lb

## 2021-08-27 DIAGNOSIS — H5789 Other specified disorders of eye and adnexa: Secondary | ICD-10-CM

## 2021-08-27 MED ORDER — NAPHAZOLINE-PHENIRAMINE 0.025-0.3 % OP SOLN: 1.0000 [drp] | Freq: Four times a day (QID) | OPHTHALMIC | 0 refills | Status: DC | PRN

## 2021-08-27 NOTE — Progress Notes (Signed)
Both eyes are still bothering him, Not as bad but bothers him./CL,RMA

## 2021-08-27 NOTE — Progress Notes (Signed)
° °  Subjective: Left eye irritation    Patient ID: Thomas Mckay, male    DOB: [birth year], 36 y.o.   MRN: [id number]  HPI Patient complains of 8 days of left eye irritation.  Patient suspected foreign body on 08/19/2021.  Patient came to this department was told there was irritation of both eyes but no foreign body visible.  Patient's been using normal saline eye irrigation with no noticeable relief.  Denies vision change.   Review of Systems Negative septal complaint.    Objective:   Physical Exam No acute distress.  See nurses note for visual acuity. Obtain patient consent to anesthetize the eye using tetracaine and fluorescein stain.  No visible foreign body or corneal abrasion visible.       Assessment & Plan: Left eye irritation.  We irrigated patient to wash out the stain.  Patient given a prescription for Naphcon and advised to follow-up in 5 days if no improvement or worsening of complaint.

## 2021-08-28 ENCOUNTER — Ambulatory Visit: Payer: 59

## 2021-08-28 ENCOUNTER — Other Ambulatory Visit: Payer: Self-pay

## 2021-08-28 DIAGNOSIS — M6281 Muscle weakness (generalized): Secondary | ICD-10-CM | POA: Diagnosis not present

## 2021-08-28 DIAGNOSIS — M542 Cervicalgia: Secondary | ICD-10-CM

## 2021-08-28 DIAGNOSIS — M545 Low back pain, unspecified: Secondary | ICD-10-CM
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
## 2021-09-02 ENCOUNTER — Other Ambulatory Visit: Payer: Self-pay

## 2021-09-02 ENCOUNTER — Ambulatory Visit: Payer: 59

## 2021-09-02 DIAGNOSIS — M6281 Muscle weakness (generalized): Secondary | ICD-10-CM | POA: Diagnosis not present

## 2021-09-02 DIAGNOSIS — M545 Low back pain, unspecified: Secondary | ICD-10-CM | POA: Diagnosis not present

## 2021-09-02 DIAGNOSIS — M542 Cervicalgia: Secondary | ICD-10-CM | POA: Diagnosis not present
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
## 2021-09-04 ENCOUNTER — Other Ambulatory Visit: Payer: Self-pay

## 2021-09-04 ENCOUNTER — Ambulatory Visit: Payer: 59

## 2021-09-04 DIAGNOSIS — M6281 Muscle weakness (generalized): Secondary | ICD-10-CM | POA: Diagnosis not present

## 2021-09-04 DIAGNOSIS — M545 Low back pain, unspecified: Secondary | ICD-10-CM | POA: Diagnosis not present

## 2021-09-04 DIAGNOSIS — M542 Cervicalgia: Secondary | ICD-10-CM | POA: Diagnosis not present
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
## 2021-09-11 ENCOUNTER — Other Ambulatory Visit: Payer: Self-pay

## 2021-09-11 ENCOUNTER — Ambulatory Visit: Payer: 59

## 2021-09-11 DIAGNOSIS — M542 Cervicalgia: Secondary | ICD-10-CM | POA: Diagnosis not present

## 2021-09-11 DIAGNOSIS — M6281 Muscle weakness (generalized): Secondary | ICD-10-CM

## 2021-09-11 DIAGNOSIS — M545 Low back pain, unspecified: Secondary | ICD-10-CM | POA: Diagnosis not present
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
## 2021-09-16 ENCOUNTER — Ambulatory Visit: Payer: 59

## 2021-09-23 ENCOUNTER — Ambulatory Visit: Payer: 59

## 2021-09-24 ENCOUNTER — Ambulatory Visit: Payer: 59 | Attending: Orthopaedic Surgery

## 2021-09-24 ENCOUNTER — Other Ambulatory Visit: Payer: Self-pay

## 2021-09-24 DIAGNOSIS — M542 Cervicalgia: Secondary | ICD-10-CM | POA: Insufficient documentation

## 2021-09-24 DIAGNOSIS — M6281 Muscle weakness (generalized): Secondary | ICD-10-CM | POA: Insufficient documentation

## 2021-09-24 DIAGNOSIS — R262 Difficulty in walking, not elsewhere classified: Secondary | ICD-10-CM | POA: Insufficient documentation

## 2021-09-24 DIAGNOSIS — M545 Low back pain, unspecified: Secondary | ICD-10-CM | POA: Diagnosis not present

## 2021-09-24 DIAGNOSIS — M5412 Radiculopathy, cervical region: Secondary | ICD-10-CM | POA: Diagnosis not present
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
## 2021-09-30 ENCOUNTER — Ambulatory Visit: Payer: 59

## 2021-10-01 ENCOUNTER — Other Ambulatory Visit: Payer: Self-pay

## 2021-10-01 ENCOUNTER — Ambulatory Visit: Payer: 59

## 2021-10-01 DIAGNOSIS — M542 Cervicalgia: Secondary | ICD-10-CM | POA: Diagnosis not present

## 2021-10-01 DIAGNOSIS — M6281 Muscle weakness (generalized): Secondary | ICD-10-CM | POA: Diagnosis not present

## 2021-10-01 DIAGNOSIS — M5412 Radiculopathy, cervical region: Secondary | ICD-10-CM | POA: Diagnosis not present

## 2021-10-01 DIAGNOSIS — M545 Low back pain, unspecified: Secondary | ICD-10-CM | POA: Diagnosis not present

## 2021-10-01 DIAGNOSIS — R262 Difficulty in walking, not elsewhere classified: Secondary | ICD-10-CM | POA: Diagnosis not present
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
## 2021-10-06 ENCOUNTER — Ambulatory Visit: Payer: 59

## 2021-10-06 ENCOUNTER — Other Ambulatory Visit: Payer: Self-pay

## 2021-10-06 DIAGNOSIS — M5412 Radiculopathy, cervical region: Secondary | ICD-10-CM | POA: Diagnosis not present

## 2021-10-06 DIAGNOSIS — M545 Low back pain, unspecified: Secondary | ICD-10-CM

## 2021-10-06 DIAGNOSIS — M542 Cervicalgia: Secondary | ICD-10-CM | POA: Diagnosis not present

## 2021-10-06 DIAGNOSIS — R262 Difficulty in walking, not elsewhere classified: Secondary | ICD-10-CM | POA: Diagnosis not present

## 2021-10-06 DIAGNOSIS — M6281 Muscle weakness (generalized): Secondary | ICD-10-CM | POA: Diagnosis not present
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
## 2021-10-08 ENCOUNTER — Ambulatory Visit: Payer: 59

## 2021-10-08 DIAGNOSIS — M545 Low back pain, unspecified: Secondary | ICD-10-CM | POA: Diagnosis not present

## 2021-10-08 DIAGNOSIS — M6281 Muscle weakness (generalized): Secondary | ICD-10-CM

## 2021-10-08 DIAGNOSIS — M5412 Radiculopathy, cervical region: Secondary | ICD-10-CM | POA: Diagnosis not present

## 2021-10-08 DIAGNOSIS — M542 Cervicalgia: Secondary | ICD-10-CM | POA: Diagnosis not present

## 2021-10-08 DIAGNOSIS — R262 Difficulty in walking, not elsewhere classified: Secondary | ICD-10-CM | POA: Diagnosis not present
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
## 2021-10-13 ENCOUNTER — Other Ambulatory Visit: Payer: Self-pay

## 2021-10-13 ENCOUNTER — Ambulatory Visit: Payer: 59

## 2021-10-13 DIAGNOSIS — M542 Cervicalgia: Secondary | ICD-10-CM | POA: Diagnosis not present

## 2021-10-13 DIAGNOSIS — M545 Low back pain, unspecified: Secondary | ICD-10-CM | POA: Diagnosis not present

## 2021-10-13 DIAGNOSIS — M6281 Muscle weakness (generalized): Secondary | ICD-10-CM

## 2021-10-13 DIAGNOSIS — M5412 Radiculopathy, cervical region: Secondary | ICD-10-CM | POA: Diagnosis not present

## 2021-10-13 DIAGNOSIS — R262 Difficulty in walking, not elsewhere classified: Secondary | ICD-10-CM | POA: Diagnosis not present
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
## 2021-10-15 ENCOUNTER — Ambulatory Visit: Payer: 59

## 2021-10-15 DIAGNOSIS — M545 Low back pain, unspecified: Secondary | ICD-10-CM

## 2021-10-15 DIAGNOSIS — M6281 Muscle weakness (generalized): Secondary | ICD-10-CM

## 2021-10-15 DIAGNOSIS — R262 Difficulty in walking, not elsewhere classified: Secondary | ICD-10-CM | POA: Diagnosis not present

## 2021-10-15 DIAGNOSIS — M5412 Radiculopathy, cervical region: Secondary | ICD-10-CM | POA: Diagnosis not present

## 2021-10-15 DIAGNOSIS — M542 Cervicalgia: Secondary | ICD-10-CM | POA: Diagnosis not present
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
## 2021-10-21 ENCOUNTER — Ambulatory Visit: Payer: 59 | Attending: Orthopaedic Surgery

## 2021-10-21 DIAGNOSIS — M542 Cervicalgia: Secondary | ICD-10-CM | POA: Insufficient documentation

## 2021-10-21 DIAGNOSIS — R262 Difficulty in walking, not elsewhere classified: Secondary | ICD-10-CM | POA: Insufficient documentation

## 2021-10-21 DIAGNOSIS — M545 Low back pain, unspecified: Secondary | ICD-10-CM | POA: Insufficient documentation

## 2021-10-21 DIAGNOSIS — M5412 Radiculopathy, cervical region: Secondary | ICD-10-CM | POA: Insufficient documentation

## 2021-10-21 DIAGNOSIS — M6281 Muscle weakness (generalized): Secondary | ICD-10-CM | POA: Insufficient documentation
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
## 2021-10-23 ENCOUNTER — Ambulatory Visit: Payer: 59

## 2021-10-23 DIAGNOSIS — M5412 Radiculopathy, cervical region: Secondary | ICD-10-CM | POA: Diagnosis not present

## 2021-10-23 DIAGNOSIS — M545 Low back pain, unspecified: Secondary | ICD-10-CM | POA: Diagnosis not present

## 2021-10-23 DIAGNOSIS — M6281 Muscle weakness (generalized): Secondary | ICD-10-CM

## 2021-10-23 DIAGNOSIS — R262 Difficulty in walking, not elsewhere classified: Secondary | ICD-10-CM | POA: Diagnosis not present

## 2021-10-23 DIAGNOSIS — M542 Cervicalgia: Secondary | ICD-10-CM | POA: Diagnosis not present
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
## 2021-10-28 ENCOUNTER — Ambulatory Visit: Payer: 59

## 2021-10-28 DIAGNOSIS — R262 Difficulty in walking, not elsewhere classified: Secondary | ICD-10-CM | POA: Diagnosis not present

## 2021-10-28 DIAGNOSIS — M542 Cervicalgia: Secondary | ICD-10-CM | POA: Diagnosis not present

## 2021-10-28 DIAGNOSIS — M5412 Radiculopathy, cervical region: Secondary | ICD-10-CM | POA: Diagnosis not present

## 2021-10-28 DIAGNOSIS — M6281 Muscle weakness (generalized): Secondary | ICD-10-CM | POA: Diagnosis not present

## 2021-10-28 DIAGNOSIS — M545 Low back pain, unspecified: Secondary | ICD-10-CM
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
## 2021-10-30 ENCOUNTER — Ambulatory Visit: Payer: 59

## 2021-10-30 DIAGNOSIS — M5412 Radiculopathy, cervical region: Secondary | ICD-10-CM | POA: Diagnosis not present

## 2021-10-30 DIAGNOSIS — M542 Cervicalgia: Secondary | ICD-10-CM | POA: Diagnosis not present

## 2021-10-30 DIAGNOSIS — M6281 Muscle weakness (generalized): Secondary | ICD-10-CM

## 2021-10-30 DIAGNOSIS — M545 Low back pain, unspecified: Secondary | ICD-10-CM | POA: Diagnosis not present

## 2021-10-30 DIAGNOSIS — R262 Difficulty in walking, not elsewhere classified: Secondary | ICD-10-CM | POA: Diagnosis not present
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
## 2021-10-31 ENCOUNTER — Other Ambulatory Visit: Payer: Self-pay

## 2021-10-31 DIAGNOSIS — A6000 Herpesviral infection of urogenital system, unspecified: Secondary | ICD-10-CM

## 2021-10-31 MED ORDER — VALACYCLOVIR HCL 500 MG PO TABS: 500.0000 mg | ORAL_TABLET | Freq: Two times a day (BID) | ORAL | 5 refills | Status: DC

## 2021-11-04 ENCOUNTER — Ambulatory Visit: Payer: 59

## 2021-11-05 ENCOUNTER — Ambulatory Visit: Payer: 59

## 2021-11-06 ENCOUNTER — Ambulatory Visit: Payer: 59

## 2021-11-06 DIAGNOSIS — R262 Difficulty in walking, not elsewhere classified: Secondary | ICD-10-CM

## 2021-11-06 DIAGNOSIS — M545 Low back pain, unspecified: Secondary | ICD-10-CM

## 2021-11-06 DIAGNOSIS — M542 Cervicalgia: Secondary | ICD-10-CM | POA: Diagnosis not present

## 2021-11-06 DIAGNOSIS — M6281 Muscle weakness (generalized): Secondary | ICD-10-CM

## 2021-11-06 DIAGNOSIS — M5412 Radiculopathy, cervical region: Secondary | ICD-10-CM
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
## 2021-11-12 ENCOUNTER — Ambulatory Visit: Payer: 59

## 2021-11-12 DIAGNOSIS — M542 Cervicalgia: Secondary | ICD-10-CM | POA: Diagnosis not present

## 2021-11-12 DIAGNOSIS — M545 Low back pain, unspecified: Secondary | ICD-10-CM | POA: Diagnosis not present

## 2021-11-12 DIAGNOSIS — M6281 Muscle weakness (generalized): Secondary | ICD-10-CM

## 2021-11-12 DIAGNOSIS — R262 Difficulty in walking, not elsewhere classified: Secondary | ICD-10-CM | POA: Diagnosis not present

## 2021-11-12 DIAGNOSIS — M5412 Radiculopathy, cervical region: Secondary | ICD-10-CM | POA: Diagnosis not present
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
## 2021-11-19 ENCOUNTER — Ambulatory Visit: Payer: 59 | Attending: Orthopaedic Surgery

## 2021-11-19 DIAGNOSIS — M545 Low back pain, unspecified: Secondary | ICD-10-CM | POA: Diagnosis not present

## 2021-11-19 DIAGNOSIS — M542 Cervicalgia: Secondary | ICD-10-CM | POA: Diagnosis not present

## 2021-11-19 DIAGNOSIS — R262 Difficulty in walking, not elsewhere classified: Secondary | ICD-10-CM | POA: Diagnosis not present

## 2021-11-19 DIAGNOSIS — M6281 Muscle weakness (generalized): Secondary | ICD-10-CM | POA: Diagnosis not present

## 2021-11-19 DIAGNOSIS — M5412 Radiculopathy, cervical region: Secondary | ICD-10-CM | POA: Diagnosis not present
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
## 2021-11-26 ENCOUNTER — Ambulatory Visit: Payer: 59

## 2021-11-26 DIAGNOSIS — M6281 Muscle weakness (generalized): Secondary | ICD-10-CM | POA: Diagnosis not present

## 2021-11-26 DIAGNOSIS — M545 Low back pain, unspecified: Secondary | ICD-10-CM | POA: Diagnosis not present

## 2021-11-26 DIAGNOSIS — R262 Difficulty in walking, not elsewhere classified: Secondary | ICD-10-CM | POA: Diagnosis not present

## 2021-11-26 DIAGNOSIS — M542 Cervicalgia: Secondary | ICD-10-CM | POA: Diagnosis not present

## 2021-11-26 DIAGNOSIS — M5412 Radiculopathy, cervical region: Secondary | ICD-10-CM | POA: Diagnosis not present
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
## 2021-12-01 ENCOUNTER — Ambulatory Visit: Payer: 59

## 2021-12-01 ENCOUNTER — Telehealth: Payer: Self-pay

## 2021-12-01 NOTE — Telephone Encounter (Signed)
Pt did not show for appointment this date, nor did he contact clinic. Author called pt on mobile number, pt reports he forgot about appointment time as he typically has afternoon times. Pt made aware of next appointment date/time.  ? ?10:12 AM, 12/01/21 ?Etta Grandchild, PT, DPT ?Physical Therapist - [place] ?[phone] (Office) ? ?

## 2021-12-03 ENCOUNTER — Ambulatory Visit: Payer: 59

## 2021-12-03 DIAGNOSIS — R262 Difficulty in walking, not elsewhere classified: Secondary | ICD-10-CM

## 2021-12-03 DIAGNOSIS — M5412 Radiculopathy, cervical region: Secondary | ICD-10-CM

## 2021-12-03 DIAGNOSIS — M545 Low back pain, unspecified: Secondary | ICD-10-CM

## 2021-12-03 DIAGNOSIS — M542 Cervicalgia: Secondary | ICD-10-CM

## 2021-12-03 DIAGNOSIS — M6281 Muscle weakness (generalized): Secondary | ICD-10-CM
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
## 2021-12-04 ENCOUNTER — Ambulatory Visit: Payer: Self-pay | Admitting: Physician Assistant

## 2021-12-04 ENCOUNTER — Encounter: Payer: Self-pay | Admitting: Physician Assistant

## 2021-12-04 ENCOUNTER — Other Ambulatory Visit: Payer: Self-pay

## 2021-12-04 DIAGNOSIS — Z1152 Encounter for screening for COVID-19: Secondary | ICD-10-CM

## 2021-12-04 DIAGNOSIS — B349 Viral infection, unspecified: Secondary | ICD-10-CM

## 2021-12-04 LAB — POCT INFLUENZA A/B
Influenza A, POC: NEGATIVE
Influenza B, POC: NEGATIVE

## 2021-12-04 MED ORDER — PSEUDOEPH-BROMPHEN-DM 30-2-10 MG/5ML PO SYRP: 5.0000 mL | ORAL_SOLUTION | Freq: Four times a day (QID) | ORAL | 0 refills | Status: DC | PRN

## 2021-12-04 NOTE — Progress Notes (Signed)
   Subjective: Cough and congestion for 1 week.    Patient ID: Thomas Mckay, male    DOB: 15-Dec-1985, 36 y.o.   MRN: [id number]  HPI Patient complains of nasal congestion intermittent runny nose and productive cough for 1 week.  Patient tested negative for influenza and COVID-19 prior to his conversation.   Review of Systems Negative except for complaint    Objective:   Physical Exam  This is a virtual visit.      Assessment & Plan: Viral respiratory infection.   Patient given advice on supportive care and advised to take Bromfed-DM as directed.  Follow-up with no improvement in 3 to 5 days.

## 2021-12-04 NOTE — Progress Notes (Signed)
Congestion, runny nose, low fever, and cough for two weeks.

## 2021-12-09 ENCOUNTER — Ambulatory Visit: Payer: 59

## 2021-12-09 DIAGNOSIS — M545 Low back pain, unspecified: Secondary | ICD-10-CM

## 2021-12-09 DIAGNOSIS — M5412 Radiculopathy, cervical region: Secondary | ICD-10-CM | POA: Diagnosis not present

## 2021-12-09 DIAGNOSIS — R262 Difficulty in walking, not elsewhere classified: Secondary | ICD-10-CM | POA: Diagnosis not present

## 2021-12-09 DIAGNOSIS — M542 Cervicalgia: Secondary | ICD-10-CM | POA: Diagnosis not present

## 2021-12-09 DIAGNOSIS — M6281 Muscle weakness (generalized): Secondary | ICD-10-CM | POA: Diagnosis not present
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
## 2021-12-17 ENCOUNTER — Ambulatory Visit: Payer: 59

## 2021-12-17 ENCOUNTER — Other Ambulatory Visit: Payer: Self-pay

## 2021-12-17 ENCOUNTER — Other Ambulatory Visit: Payer: 59

## 2021-12-17 DIAGNOSIS — R109 Unspecified abdominal pain: Secondary | ICD-10-CM

## 2021-12-17 DIAGNOSIS — G4483 Primary cough headache: Secondary | ICD-10-CM

## 2021-12-17 DIAGNOSIS — R509 Fever, unspecified: Secondary | ICD-10-CM

## 2021-12-17 LAB — POC COVID19 BINAXNOW: SARS Coronavirus 2 Ag: NEGATIVE

## 2021-12-17 LAB — POCT INFLUENZA A/B
Influenza A, POC: NEGATIVE
Influenza B, POC: NEGATIVE

## 2021-12-17 NOTE — Progress Notes (Signed)
S/Sx since Monday: Cough - has been going on for a couple weeks Fever - 101.8 F (now it's 98 with tylenol/ibuprofen) Body aches Fatigue Chills Stomach ache Headache  Has been taking tylenol, ibuprofen & sudafed  Denies exposure to known person(s) positive for covid or flu  Home covid test was negative  Rapid Influenza A = Negative Rapid Influenza B = Negative Rapid Covd Test = Negative PCR Covid Test = Results Pending - to [facility] for processing  AMD

## 2021-12-18 LAB — NOVEL CORONAVIRUS, NAA: SARS-CoV-2, NAA: NOT DETECTED

## 2021-12-19 ENCOUNTER — Ambulatory Visit
Admission: EM | Admit: 2021-12-19 | Discharge: 2021-12-19 | Disposition: A | Discharge status: Home / Self Care | Payer: 59

## 2021-12-19 DIAGNOSIS — R197 Diarrhea, unspecified: Secondary | ICD-10-CM

## 2021-12-19 DIAGNOSIS — R1011 Right upper quadrant pain: Secondary | ICD-10-CM

## 2021-12-19 DIAGNOSIS — R509 Fever, unspecified: Secondary | ICD-10-CM | POA: Diagnosis not present

## 2021-12-19 DIAGNOSIS — R42 Dizziness and giddiness: Secondary | ICD-10-CM | POA: Diagnosis not present

## 2021-12-19 DIAGNOSIS — R519 Headache, unspecified: Secondary | ICD-10-CM | POA: Diagnosis not present

## 2021-12-19 DIAGNOSIS — K838 Other specified diseases of biliary tract: Secondary | ICD-10-CM | POA: Diagnosis not present

## 2021-12-19 DIAGNOSIS — K529 Noninfective gastroenteritis and colitis, unspecified: Secondary | ICD-10-CM | POA: Diagnosis not present

## 2021-12-19 DIAGNOSIS — R112 Nausea with vomiting, unspecified: Secondary | ICD-10-CM | POA: Diagnosis not present

## 2021-12-19 DIAGNOSIS — R1010 Upper abdominal pain, unspecified: Secondary | ICD-10-CM | POA: Diagnosis not present

## 2021-12-19 DIAGNOSIS — R918 Other nonspecific abnormal finding of lung field: Secondary | ICD-10-CM | POA: Diagnosis not present

## 2021-12-19 NOTE — Discharge Instructions (Addendum)
Please go to the emergency department at [facility] for evaluation of your abdominal symptoms.  As I stated, I am concerned given your fever and right upper quad abdominal pain that she may have an infected gallbladder.  This requires imaging to evaluate that we cannot perform here at the urgent care.

## 2021-12-19 NOTE — ED Provider Notes (Signed)
[facility]    CSN: [id number] Arrival date & time: 12/19/21  0955      History   Chief Complaint Chief Complaint  Patient presents with   Headache   Dizziness   Emesis   Diarrhea    HPI Thomas Mckay is a 36 y.o. male.   HPI  36 year old male here for evaluation of GI complaints.  Patient reports that for last 4 days she has been experiencing a headache, dizziness, nausea, vomiting, and diarrhea.  He states he is also had a continuous fever with a Tmax of 102.  Last several days he has been running in the 100.6 range.  He is also has abdominal pain and a decreased appetite.  He has been able to eat some noodles, banana, and spinach without vomiting.  He is also been able to drink water without vomiting.  If he drinks Gatorade it does induce vomiting.  He denies any syncope, recent travel, or any other people in his house with similar symptoms.  He denies any blood in his emesis or stool.  Past Medical History:  Diagnosis Date   Arthritis    wrists   History of chlamydia    History of herpes genitalis    Non Hodgkin's lymphoma ([place])    in remission. completed treatments 08/2012   Seizures ([place])    x1. as infant    Patient Active Problem List   Diagnosis Date Noted   Stiffness of right knee 07/03/2021   Cervical spine pain 04/02/2021   Cervical radiculopathy 04/02/2021   Cervical spondylosis 04/02/2021   Strain of neck muscle 04/02/2021   Right knee pain 12/20/2020   Research study patient 12/06/2019   Abdominal pain 02/22/2019   Proteinuria 02/22/2019   Asymptomatic microscopic hematuria 02/22/2019   Pain in joint involving ankle and foot 02/01/2019   Achilles bursitis 02/01/2019   Achilles tendinitis 02/01/2019   Hip pain 02/01/2019   Lumbar sprain 02/01/2019   Sprain of deltoid ligament of ankle 02/01/2019   Sprain of hand 02/01/2019   Pre-bariatric surgery nutrition evaluation 06/03/2017   Gastroesophageal reflux disease 05/24/2017   Type 2  HSV infection of penis 05/24/2017   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   Shoulder joint pain 05/14/2017   Non-Hodgkin lymphoma ([place]) 04/14/2017   Bilateral carpal tunnel syndrome 01/28/2017   Tear of right glenoid labrum 10/09/2014   Diffuse large B-cell lymphoma of lymph nodes of neck ([place]) 08/02/2014   History of antineoplastic chemotherapy 11/22/2012   History of radiation therapy 11/22/2012   Allergic rhinitis 08/18/2007    Past Surgical History:  Procedure Laterality Date   LYMPH NODE BIOPSY     SHOULDER ARTHROSCOPY WITH LABRAL REPAIR Right 06/14/2017   Procedure: SHOULDER ARTHROSCOPY WITH LABRAL REPAIR and subacromial debridement.;  Surgeon: Thornton Park, MD;  Location: [place];  Service: Orthopedics;  Laterality: Right;   WISDOM TOOTH EXTRACTION         Home Medications    Prior to Admission medications   Medication Sig Start Date End Date Taking? Authorizing Provider  brompheniramine-pseudoephedrine-DM 30-2-10 MG/5ML syrup Take 5 mLs by mouth 4 (four) times daily as needed. 12/04/21   Sable Feil, PA-C  fluticasone (FLONASE) 50 MCG/ACT nasal spray Place 2 sprays into both nostrils daily. 05/02/21   Sable Feil, PA-C  naphazoline-pheniramine (NAPHCON-A) 0.025-0.3 % ophthalmic solution Place 1 drop into the left eye 4 (four) times daily as needed for eye irritation. 08/27/21   Sable Feil, PA-C  omeprazole ([place])  40 MG capsule Take 1 capsule (40 mg total) by mouth daily. 05/02/21   Sable Feil, PA-C  Sod Fluoride-Potassium Nitrate 1.1-5 % PSTE sodium fluoride 1.1 %-potassium nitrate 5 % dental paste  BRUSH AND EXPECTORATE ONLY, DO NOT RINSE, 30 MIN PRIOR TO ANY FOOD OR DRINK    [provider]  sodium fluoride (FLUORISHIELD) 1.1 % GEL dental gel Place onto teeth. 05/26/16   [provider]  valACYclovir (VALTREX) 500 MG tablet Take 1 tablet (500 mg total) by mouth 2 (two) times daily. 10/31/21   Sable Feil, PA-C  gabapentin  (NEURONTIN) 300 MG capsule gabapentin 300 mg capsule  07/04/19  [provider]    Family History Family History  Problem Relation Age of Onset   Asthma Mother    Hypertension Father    Asthma Brother     Social History Social History   Tobacco Use   Smoking status: Never   Smokeless tobacco: Never  Vaping Use   Vaping Use: Never used  Substance Use Topics   Alcohol use: Yes    Comment: rare - Holidays   Drug use: Never     Allergies   Patient has no known allergies.   Review of Systems Review of Systems  Constitutional:  Positive for fever.  Gastrointestinal:  Positive for abdominal pain, diarrhea, nausea and vomiting. Negative for blood in stool.  Skin:  Negative for rash.  Neurological:  Positive for dizziness and headaches. Negative for syncope.  Hematological: Negative.   Psychiatric/Behavioral: Negative.      Physical Exam Triage Vital Signs ED Triage Vitals  Enc Vitals Group     BP 12/19/21 1120 121/86     Pulse Rate 12/19/21 1120 93     Resp 12/19/21 1120 18     Temp 12/19/21 1120 98.7 F (37.1 C)     Temp Source 12/19/21 1120 Oral     SpO2 12/19/21 1120 93 %     Weight --      Height --      Head Circumference --      Peak Flow --      Pain Score 12/19/21 1119 6     Pain Loc --      Pain Edu? --      Excl. in [place]? --    No data found.  Updated Vital Signs BP 121/86 (BP Location: Right Arm)   Pulse 93   Temp 98.7 F (37.1 C) (Oral)   Resp 18   SpO2 93%   Visual Acuity Right Eye Distance:   Left Eye Distance:   Bilateral Distance:    Right Eye Near:   Left Eye Near:    Bilateral Near:     Physical Exam Vitals and nursing note reviewed.  Constitutional:      Appearance: Normal appearance. He is ill-appearing.  HENT:     Head: Normocephalic and atraumatic.  Cardiovascular:     Rate and Rhythm: Normal rate and regular rhythm.     Pulses: Normal pulses.     Heart sounds: Normal heart sounds. No murmur heard.   No  friction rub. No gallop.  Pulmonary:     Effort: Pulmonary effort is normal.     Breath sounds: Normal breath sounds. No wheezing, rhonchi or rales.  Abdominal:     General: Abdomen is flat. Bowel sounds are normal.     Palpations: Abdomen is soft.     Tenderness: There is abdominal tenderness. There is guarding. There is  no rebound.  Skin:    General: Skin is warm and dry.     Capillary Refill: Capillary refill takes less than 2 seconds.     Findings: No erythema or rash.  Neurological:     General: No focal deficit present.     Mental Status: He is alert and oriented to person, place, and time.  Psychiatric:        Mood and Affect: Mood normal.        Behavior: Behavior normal.        Thought Content: Thought content normal.        Judgment: Judgment normal.     UC Treatments / Results  Labs (all labs ordered are listed, but only abnormal results are displayed) Labs Reviewed - No data to display  EKG   Radiology No results found.  Procedures Procedures (including critical care time)  Medications Ordered in UC Medications - No data to display  Initial Impression / Assessment and Plan / UC Course  I have reviewed the triage vital signs and the nursing notes.  Pertinent labs & imaging results that were available during my care of the patient were reviewed by me and considered in my medical decision making (see chart for details).  Patient is a pleasant, though ill-appearing, 36 year old male here for evaluation of GI complaints as outlined HPI above.  Of prime concern is the fact the patient has been running a fever for the past 4 days.  He does not currently have a fever in clinic but he reports that he has been running in the 100.6 range for last couple of days.  Tmax was 4 days ago at 102.  He denies any blood in his emesis or stool.  He has been able to take in some food and fluids but he had a decreased appetite.  On exam patient has a benign cardiopulmonary exam with  S1-S2 heart sounds of regular rate and rhythm and lung sounds that are clear to auscultation in all fields.  Abdomen is soft, flat, with positive bowel sounds in all 4 quadrants.  Patient does have generalized abdominal tenderness but he has significant right upper quadrant tenderness with guarding.  I am concerned that patient may have an infected gallbladder given his symptoms and his fever.  I do not have ultrasound or CT imaging at the urgent care and I have recommended to him that he needs to be evaluated in the emergency department.  He is in agreement and states that he will go to [facility].  Patient did drive himself and I believe he is stable to drive himself to the ER.  He left in ambulatory fashion, stable condition, via POV.   Final Clinical Impressions(s) / UC Diagnoses   Final diagnoses:  Nausea vomiting and diarrhea  Abdominal pain, right upper quadrant     Discharge Instructions      Please go to the emergency department at [facility] for evaluation of your abdominal symptoms.  As I stated, I am concerned given your fever and right upper quad abdominal pain that she may have an infected gallbladder.  This requires imaging to evaluate that we cannot perform here at the urgent care.     ED Prescriptions   None    PDMP not reviewed this encounter.   Margarette Canada, NP 12/19/21 1201

## 2021-12-19 NOTE — ED Triage Notes (Signed)
Pt presents with headache, dizziness, vomiting, and diarrhea X 4 days; pt states he has had a negative covid and flu test this week.

## 2021-12-19 NOTE — ED Notes (Signed)
Patient is being discharged from the Urgent Care and sent to the [facility] Emergency Department via private vehicle . Per Margarette Canada, NP, patient is in need of higher level of care due to needing further testing and evaluation. Patient is aware and verbalizes understanding of plan of care.  Vitals:   12/19/21 1120  BP: 121/86  Pulse: 93  Resp: 18  Temp: 98.7 F (37.1 C)  SpO2: 93%

## 2021-12-22 ENCOUNTER — Ambulatory Visit: Payer: 59 | Attending: Orthopaedic Surgery

## 2021-12-22 DIAGNOSIS — M542 Cervicalgia: Secondary | ICD-10-CM | POA: Insufficient documentation

## 2021-12-22 DIAGNOSIS — M5412 Radiculopathy, cervical region: Secondary | ICD-10-CM | POA: Insufficient documentation

## 2021-12-22 DIAGNOSIS — M545 Low back pain, unspecified: Secondary | ICD-10-CM | POA: Insufficient documentation

## 2021-12-22 DIAGNOSIS — M6281 Muscle weakness (generalized): Secondary | ICD-10-CM | POA: Insufficient documentation

## 2021-12-22 DIAGNOSIS — R262 Difficulty in walking, not elsewhere classified: Secondary | ICD-10-CM | POA: Insufficient documentation
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
## 2021-12-24 ENCOUNTER — Ambulatory Visit: Payer: Self-pay | Admitting: Physician Assistant

## 2021-12-24 ENCOUNTER — Encounter: Payer: Self-pay | Admitting: Physician Assistant

## 2021-12-24 DIAGNOSIS — H6121 Impacted cerumen, right ear: Secondary | ICD-10-CM

## 2021-12-24 NOTE — Progress Notes (Signed)
   Subjective: Nausea and right ear pain    Patient ID: Thomas Mckay, male    DOB: 01/23/1986, 36 y.o.   MRN: [id number]  HPI Patient was seen in the emergency room on 6-23 and diagnosed with viral gastroenteritis.  Patient was given a prescription for Zofran and advised to increase his fluid intake.  Patient states he is feeling better and actually have work in the past 2 days.  Patient states he noticed decreased hearing and mild right ear pain for the past 2 days.  Denies vertigo.  States continues to have mild intermittent episodes of nausea.  Patient past medical history remarkable for bilateral carpal tunnel and cervical radiculopathy.  Patient continues physical therapy for these complaints.     Review of Systems Negative except for above complaints.    Objective:   Physical Exam No acute distress.  See nurses notes and vital signs. HEENT is remarkable for cerumen impaction of the right ear.  Neck is supple follow-up lymphadenopathy or bruits.  Lungs are clear to auscultation.  Heart regular rate and rhythm.       Assessment & Plan: Resolving gastroenteritis and cerumen impaction.  Patient will be scheduled for irrigation.

## 2021-12-25 ENCOUNTER — Ambulatory Visit: Payer: 59

## 2021-12-25 ENCOUNTER — Ambulatory Visit: Payer: Self-pay

## 2021-12-25 DIAGNOSIS — R262 Difficulty in walking, not elsewhere classified: Secondary | ICD-10-CM | POA: Diagnosis not present

## 2021-12-25 DIAGNOSIS — M6281 Muscle weakness (generalized): Secondary | ICD-10-CM

## 2021-12-25 DIAGNOSIS — M5412 Radiculopathy, cervical region: Secondary | ICD-10-CM

## 2021-12-25 DIAGNOSIS — M545 Low back pain, unspecified: Secondary | ICD-10-CM

## 2021-12-25 DIAGNOSIS — M542 Cervicalgia: Secondary | ICD-10-CM | POA: Diagnosis not present

## 2021-12-25 NOTE — Progress Notes (Signed)
Pt presents today for ear lavage. Right ear is clear and can see ear drum.
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
## 2021-12-29 ENCOUNTER — Ambulatory Visit: Payer: 59

## 2021-12-29 DIAGNOSIS — M545 Low back pain, unspecified: Secondary | ICD-10-CM | POA: Diagnosis not present

## 2021-12-29 DIAGNOSIS — R262 Difficulty in walking, not elsewhere classified: Secondary | ICD-10-CM | POA: Diagnosis not present

## 2021-12-29 DIAGNOSIS — M6281 Muscle weakness (generalized): Secondary | ICD-10-CM

## 2021-12-29 DIAGNOSIS — M5412 Radiculopathy, cervical region: Secondary | ICD-10-CM | POA: Diagnosis not present

## 2021-12-29 DIAGNOSIS — M542 Cervicalgia: Secondary | ICD-10-CM | POA: Diagnosis not present
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
## 2021-12-30 ENCOUNTER — Ambulatory Visit: Payer: Self-pay

## 2021-12-30 DIAGNOSIS — Z0184 Encounter for antibody response examination: Secondary | ICD-10-CM

## 2021-12-30 DIAGNOSIS — Z0000 Encounter for general adult medical examination without abnormal findings: Secondary | ICD-10-CM

## 2021-12-30 LAB — POCT URINALYSIS DIPSTICK
Bilirubin, UA: NEGATIVE
Blood, UA: NEGATIVE
Glucose, UA: NEGATIVE
Ketones, UA: NEGATIVE
Leukocytes, UA: NEGATIVE
Nitrite, UA: NEGATIVE
Protein, UA: NEGATIVE
Spec Grav, UA: 1.015 (ref 1.010–1.025)
Urobilinogen, UA: 0.2 E.U./dL
pH, UA: 7 (ref 5.0–8.0)

## 2021-12-31 LAB — CMP12+LP+TP+TSH+6AC+PSA+CBC…
ALT: 35 IU/L (ref 0–44)
AST: 31 IU/L (ref 0–40)
Albumin/Globulin Ratio: 1.5 (ref 1.2–2.2)
Albumin: 4.2 g/dL (ref 4.0–5.0)
Alkaline Phosphatase: 89 IU/L (ref 44–121)
BUN/Creatinine Ratio: 6 — ABNORMAL LOW (ref 9–20)
BUN: 8 mg/dL (ref 6–20)
Basophils Absolute: 0 10*3/uL (ref 0.0–0.2)
Basos: 1 %
Bilirubin Total: 0.6 mg/dL (ref 0.0–1.2)
Calcium: 8.9 mg/dL (ref 8.7–10.2)
Chloride: 105 mmol/L (ref 96–106)
Chol/HDL Ratio: 3.8 ratio (ref 0.0–5.0)
Cholesterol, Total: 129 mg/dL (ref 100–199)
Creatinine, Ser: 1.25 mg/dL (ref 0.76–1.27)
EOS (ABSOLUTE): 0.1 10*3/uL (ref 0.0–0.4)
Eos: 3 %
Estimated CHD Risk: 0.7 times avg. (ref 0.0–1.0)
Free Thyroxine Index: 2.2 (ref 1.2–4.9)
GGT: 30 IU/L (ref 0–65)
Globulin, Total: 2.8 g/dL (ref 1.5–4.5)
Glucose: 81 mg/dL (ref 70–99)
HDL: 34 mg/dL — ABNORMAL LOW (ref >39)
Hematocrit: 39.4 % (ref 37.5–51.0)
Hemoglobin: 13.1 g/dL (ref 13.0–17.7)
Immature Grans (Abs): 0 10*3/uL (ref 0.0–0.1)
Immature Granulocytes: 0 %
Iron: 95 ug/dL (ref 38–169)
LDH: 250 IU/L — ABNORMAL HIGH (ref 121–224)
LDL Chol Calc (NIH): 82 mg/dL (ref 0–99)
Lymphocytes Absolute: 1.4 10*3/uL (ref 0.7–3.1)
Lymphs: 38 %
MCH: 27.6 pg (ref 26.6–33.0)
MCHC: 33.2 g/dL (ref 31.5–35.7)
MCV: 83 fL (ref 79–97)
Monocytes Absolute: 0.2 10*3/uL (ref 0.1–0.9)
Monocytes: 7 %
Neutrophils Absolute: 1.8 10*3/uL (ref 1.4–7.0)
Neutrophils: 51 %
Phosphorus: 2.7 mg/dL — ABNORMAL LOW (ref 2.8–4.1)
Platelets: 224 10*3/uL (ref 150–450)
Potassium: 4.5 mmol/L (ref 3.5–5.2)
Prostate Specific Ag, Serum: 0.7 ng/mL (ref 0.0–4.0)
RBC: 4.74 x10E6/uL (ref 4.14–5.80)
RDW: 12.1 % (ref 11.6–15.4)
Sodium: 142 mmol/L (ref 134–144)
T3 Uptake Ratio: 26 % (ref 24–39)
T4, Total: 8.6 ug/dL (ref 4.5–12.0)
TSH: 2.27 u[IU]/mL (ref 0.450–4.500)
Total Protein: 7 g/dL (ref 6.0–8.5)
Triglycerides: 61 mg/dL (ref 0–149)
Uric Acid: 8.1 mg/dL (ref 3.8–8.4)
VLDL Cholesterol Cal: 13 mg/dL (ref 5–40)
WBC: 3.6 10*3/uL (ref 3.4–10.8)
eGFR: 77 mL/min/{1.73_m2} (ref >59)

## 2022-01-01 ENCOUNTER — Ambulatory Visit: Payer: 59

## 2022-01-01 DIAGNOSIS — M6281 Muscle weakness (generalized): Secondary | ICD-10-CM | POA: Diagnosis not present

## 2022-01-01 DIAGNOSIS — M5412 Radiculopathy, cervical region: Secondary | ICD-10-CM | POA: Diagnosis not present

## 2022-01-01 DIAGNOSIS — R262 Difficulty in walking, not elsewhere classified: Secondary | ICD-10-CM | POA: Diagnosis not present

## 2022-01-01 DIAGNOSIS — M545 Low back pain, unspecified: Secondary | ICD-10-CM | POA: Diagnosis not present

## 2022-01-01 DIAGNOSIS — M542 Cervicalgia: Secondary | ICD-10-CM | POA: Diagnosis not present
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
## 2022-01-06 ENCOUNTER — Ambulatory Visit: Payer: Self-pay | Admitting: Physician Assistant

## 2022-01-06 ENCOUNTER — Encounter: Payer: Self-pay | Admitting: Physician Assistant

## 2022-01-06 VITALS — BP 129/81 | HR 74 | Temp 97.7°F | Resp 16 | Ht 67.0 in | Wt 184.0 lb

## 2022-01-06 DIAGNOSIS — Z0000 Encounter for general adult medical examination without abnormal findings: Secondary | ICD-10-CM

## 2022-01-06 NOTE — Progress Notes (Signed)
[facility] occupational health clinic  ____________________________________________   None    (approximate)  I have reviewed the triage vital signs and the nursing notes.   HISTORY  Chief Complaint Annual Exam    HPI Thomas Mckay is a 36 y.o. male patient presents for annual physical exam.  Patient voiced no concerns or complaints.  For annual physical exam.  Patient voiced no concerns or complaints.         Past Medical History:  Diagnosis Date   Arthritis    wrists   History of chlamydia    History of herpes genitalis    Non Hodgkin's lymphoma ([facility])    in remission. completed treatments 08/2012   Seizures ([facility])    x1. as infant    Patient Active Problem List   Diagnosis Date Noted   Stiffness of right knee 07/03/2021   Cervical spine pain 04/02/2021   Cervical radiculopathy 04/02/2021   Cervical spondylosis 04/02/2021   Strain of neck muscle 04/02/2021   Right knee pain 12/20/2020   Research study patient 12/06/2019   Abdominal pain 02/22/2019   Proteinuria 02/22/2019   Asymptomatic microscopic hematuria 02/22/2019   Pain in joint involving ankle and foot 02/01/2019   Achilles bursitis 02/01/2019   Achilles tendinitis 02/01/2019   Hip pain 02/01/2019   Lumbar sprain 02/01/2019   Sprain of deltoid ligament of ankle 02/01/2019   Sprain of hand 02/01/2019   Pre-bariatric surgery nutrition evaluation 06/03/2017   Gastroesophageal reflux disease 05/24/2017   Type 2 HSV infection of penis 05/24/2017   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   Shoulder joint pain 05/14/2017   Non-Hodgkin lymphoma ([facility]) 04/14/2017   Bilateral carpal tunnel syndrome 01/28/2017   Tear of right glenoid labrum 10/09/2014   Diffuse large B-cell lymphoma of lymph nodes of neck ([facility]) 08/02/2014   History of antineoplastic chemotherapy 11/22/2012   History of radiation therapy 11/22/2012   Allergic rhinitis 08/18/2007    Past Surgical History:  Procedure Laterality Date    LYMPH NODE BIOPSY     SHOULDER ARTHROSCOPY WITH LABRAL REPAIR Right 06/14/2017   Procedure: SHOULDER ARTHROSCOPY WITH LABRAL REPAIR and subacromial debridement.;  Surgeon: Juanell Fairly, MD;  Location[facility];  Service: Orthopedics;  Laterality: Right;   WISDOM TOOTH EXTRACTION      Prior to Admission medications   Medication Sig Start Date End Date Taking? Authorizing Provider  brompheniramine-pseudoephedrine-DM 30-2-10 MG/5ML syrup Take 5 mLs by mouth 4 (four) times daily as needed. 12/04/21   Joni Reining, PA-C  fluticasone (FLONASE) 50 MCG/ACT nasal spray Place 2 sprays into both nostrils daily. 05/02/21   Joni Reining, PA-C  naphazoline-pheniramine (NAPHCON-A) 0.025-0.3 % ophthalmic solution Place 1 drop into the left eye 4 (four) times daily as needed for eye irritation. 08/27/21   Joni Reining, PA-C  omeprazole (PRILOSEC) 40 MG capsule Take 1 capsule (40 mg total) by mouth daily. 05/02/21   Joni Reining, PA-C  ondansetron (ZOFRAN-ODT) 4 MG disintegrating tablet Take 4 mg by mouth every 8 (eight) hours as needed. 12/19/21   [provider]  Sod Fluoride-Potassium Nitrate 1.1-5 % PSTE sodium fluoride 1.1 %-potassium nitrate 5 % dental paste  BRUSH AND EXPECTORATE ONLY, DO NOT RINSE, 30 MIN PRIOR TO ANY FOOD OR DRINK    [provider]  sodium fluoride (FLUORISHIELD) 1.1 % GEL dental gel Place onto teeth. 05/26/16   [provider]  valACYclovir (VALTREX) 500 MG tablet Take 1 tablet (500 mg total) by mouth  2 (two) times daily. 10/31/21   Sable Feil, PA-C  gabapentin (NEURONTIN) 300 MG capsule gabapentin 300 mg capsule  07/04/19  [provider]    Allergies Patient has no known allergies.  Family History  Problem Relation Age of Onset   Asthma Mother    Hypertension Father    Asthma Brother     Social History Social History   Tobacco Use   Smoking status: Never   Smokeless tobacco: Never  Vaping Use   Vaping  Use: Never used  Substance Use Topics   Alcohol use: Yes    Comment: rare - Holidays   Drug use: Never    Review of Systems Constitutional: No fever/chills Eyes: No visual changes. ENT: No sore throat. Cardiovascular: Denies chest pain. Respiratory: Denies shortness of breath. Gastrointestinal: No abdominal pain.  No nausea, no vomiting.  No diarrhea.  No constipation. Genitourinary: Negative for dysuria. Musculoskeletal: Negative for back pain. Skin: Negative for rash. Neurological: Negative for headaches, focal weakness or numbness.   ____________________________________________   PHYSICAL EXAM:  VITAL SIGNS: BP is 129/81, pulse 74, respiration 16, temperature 97.7, patient 95% O2 sat on room air.  Patient weighs on 84 pounds BMI is 28.82. Constitutional: Alert and oriented. Well appearing and in no acute distress. Eyes: Conjunctivae are normal. PERRL. EOMI. Head: Atraumatic. Nose: No congestion/rhinnorhea. Mouth/Throat: Mucous membranes are moist.  Oropharynx non-erythematous. Neck: No stridor.  No cervical spine tenderness to palpation. Hematological/Lymphatic/Immunilogical: No cervical lymphadenopathy. Cardiovascular: Normal rate, regular rhythm. Grossly normal heart sounds.  Good peripheral circulation. Respiratory: Normal respiratory effort.  No retractions. Lungs CTAB. Gastrointestinal: Soft and nontender. No distention. No abdominal bruits. No CVA tenderness. Genitourinary: Deferred Musculoskeletal: No lower extremity tenderness nor edema.  No joint effusions. Neurologic:  Normal speech and language. No gross focal neurologic deficits are appreciated. No gait instability. Skin:  Skin is warm, dry and intact. No rash noted. Psychiatric: Mood and affect are normal. Speech and behavior are normal.  ____________________________________________   LABS           Component Ref Range & Units 7 d ago (12/30/21) 11 mo ago (01/15/21) 2 yr ago (12/01/19) 2 yr ago (08/06/19)  2 yr ago (07/04/19) 2 yr ago (03/23/19) 2 yr ago (03/23/19)  Color, UA  yellow  Dark Yellow  Dark Yellow    Yellow R  dark yellow   Clarity, UA  clear  Clear  Clear     clear   Glucose, UA Negative Negative  Negative  Negative     Negative   Bilirubin, UA  neg  Negative  Negative    Negative R  neg   Ketones, UA  neg  Negative  Negative    Negative R  neg   Spec Grav, UA 1.010 - 1.025 1.015  1.025  1.020    1.025 R  >=1.030 Abnormal    Blood, UA  neg  Positive CM  Positive CM    Negative R  positive CM   pH, UA 5.0 - 8.0 7.0  6.0  6.0    5.5 R  6.0   Protein, UA Negative Negative  Positive Abnormal  CM  Negative    Negative R  Negative   Urobilinogen, UA 0.2 or 1.0 E.U./dL 0.2  0.2  0.2     0.2   Nitrite, UA  neg  Negative  Negative    Negative R  neg   Leukocytes, UA Negative Negative  Negative  Negative    Negative  Negative   Appearance       CLEAR R  CLEAR R      Odor             Resulting Agency     [facility]                   Other Results from 12/30/2021   Contains abnormal data CMP12+LP+TP+TSH+6AC+PSA+CBC. Order: [id number] Status: Final result    Visible to patient: Yes (not seen)    Next appt: 01/07/2022 at 04:30 PM in Rehabilitation (Laygo,Miguel, PT)    Dx: Routine adult health maintenance    0 Result Notes       Component Ref Range & Units 7 d ago 11 mo ago 2 yr ago  Glucose 70 - 99 mg/dL 81  82 R  89 R   Uric Acid 3.8 - 8.4 mg/dL 8.1  6.8 CM  6.3 CM   Comment:            Therapeutic target for gout patients: <6.0  BUN 6 - 20 mg/dL $Remove'8  10  6   'EdaxQVw$ Creatinine, Ser 0.76 - 1.27 mg/dL 1.25  0.98  0.94   eGFR >59 mL/min/1.73 77  104    BUN/Creatinine Ratio 9 - 20 6 Low   10  6 Low    Sodium 134 - 144 mmol/L 142  142  143   Potassium 3.5 - 5.2 mmol/L 4.5  4.2  4.3   Chloride 96 - 106 mmol/L 105  104  106   Calcium 8.7 - 10.2 mg/dL 8.9  9.0  8.7   Phosphorus 2.8 - 4.1 mg/dL 2.7 Low   2.9  2.8   Total Protein 6.0 - 8.5 g/dL 7.0  7.4  6.9   Albumin  4.0 - 5.0 g/dL 4.2  4.6  4.2   Globulin, Total 1.5 - 4.5 g/dL 2.8  2.8  2.7   Albumin/Globulin Ratio 1.2 - 2.2 1.5  1.6  1.6   Bilirubin Total 0.0 - 1.2 mg/dL 0.6  1.4 High   1.3 High    Alkaline Phosphatase 44 - 121 IU/L 89  85  84 R, CM   LDH 121 - 224 IU/L 250 High   193  176   AST 0 - 40 IU/L $Remov'31  22  28   'QnfCwW$ ALT 0 - 44 IU/L 35  30  34   GGT 0 - 65 IU/L $Remov'30  29  27   'oBmyZv$ Iron 38 - 169 ug/dL 95  130  97   Cholesterol, Total 100 - 199 mg/dL 129  128  123   Triglycerides 0 - 149 mg/dL 61  82  77   HDL >39 mg/dL 34 Low   31 Low   33 Low    VLDL Cholesterol Cal 5 - 40 mg/dL $Remove'13  16  16   'nSzNFuC$ LDL Chol Calc (NIH) 0 - 99 mg/dL 82  81  74   Chol/HDL Ratio 0.0 - 5.0 ratio 3.8  4.1 CM  3.7 CM   Comment:                                   T. Chol/HDL Ratio  Men  Women                                1/2 Avg.Risk  3.4    3.3                                    Avg.Risk  5.0    4.4                                 2X Avg.Risk  9.6    7.1                                 3X Avg.Risk 23.4   11.0   Estimated CHD Risk 0.0 - 1.0 times avg. 0.7  0.8 CM  0.6 CM   Comment: The CHD Risk is based on the T. Chol/HDL ratio. Other  factors affect CHD Risk such as hypertension, smoking,  diabetes, severe obesity, and family history of  premature CHD.   TSH 0.450 - 4.500 uIU/mL 2.270  2.770  1.800   T4, Total 4.5 - 12.0 ug/dL 8.6  8.5  6.7   T3 Uptake Ratio 24 - 39 % 26  22 Low   25   Free Thyroxine Index 1.2 - 4.9 2.2  1.9  1.7   Prostate Specific Ag, Serum 0.0 - 4.0 ng/mL 0.7     Comment: Roche ECLIA methodology.  According to the American Urological Association, Serum PSA should  decrease and remain at undetectable levels after radical  prostatectomy. The AUA defines biochemical recurrence as an initial  PSA value 0.2 ng/mL or greater followed by a subsequent confirmatory  PSA value 0.2 ng/mL or greater.  Values obtained with different assay methods or kits cannot be used   interchangeably. Results cannot be interpreted as absolute evidence  of the presence or absence of malignant disease.   WBC 3.4 - 10.8 x10E3/uL 3.6  3.9  4.0   RBC 4.14 - 5.80 x10E6/uL 4.74  5.53  5.22   Hemoglobin 13.0 - 17.7 g/dL 13.1  15.7  15.0   Hematocrit 37.5 - 51.0 % 39.4  45.4  44.2   MCV 79 - 97 fL 83  82  85   MCH 26.6 - 33.0 pg 27.6  28.4  28.7   MCHC 31.5 - 35.7 g/dL 33.2  34.6  33.9   RDW 11.6 - 15.4 % 12.1  11.7  12.1   Platelets 150 - 450 x10E3/uL 224  154  151   Neutrophils Not Estab. % 51  38  46   Lymphs Not Estab. % 38  47  40   Monocytes Not Estab. % $Remove'7  7  8   'WZijqVU$ Eos Not Estab. % $Remove'3  7  4   'cjZRTwS$ Basos Not Estab. % $Remove'1  1  1   'BxyZioG$ Neutrophils Absolute 1.4 - 7.0 x10E3/uL 1.8  1.5  1.9   Lymphocytes Absolute 0.7 - 3.1 x10E3/uL 1.4  1.9  1.6   Monocytes Absolute 0.1 - 0.9 x10E3/uL 0.2  0.3  0.3   EOS (ABSOLUTE) 0.0 - 0.4 x10E3/uL 0.1  0.3  0.2   Basophils Absolute 0.0 - 0.2 x10E3/uL 0.0  0.0  0.0   Immature Granulocytes Not Estab. %  0  0  1   Immature Grans          ____________________________________________  ____________________________________________    ____________________________________________   INITIAL IMPRESSION / ASSESSMENT AND PLAN  As part of my medical decision making, I reviewed the following data within the [facility]       Discussed lab results with patient with no acute findings.      ____________________________________________   FINAL CLINICAL IMPRESSION Well exam    ED Discharge Orders     None        Note:  This document was prepared using Dragon voice recognition software and may include unintentional dictation errors.

## 2022-01-07 ENCOUNTER — Ambulatory Visit: Payer: 59

## 2022-01-07 ENCOUNTER — Other Ambulatory Visit: Payer: Self-pay | Admitting: Physician Assistant

## 2022-01-07 DIAGNOSIS — M5412 Radiculopathy, cervical region: Secondary | ICD-10-CM | POA: Diagnosis not present

## 2022-01-07 DIAGNOSIS — M542 Cervicalgia: Secondary | ICD-10-CM

## 2022-01-07 DIAGNOSIS — R262 Difficulty in walking, not elsewhere classified: Secondary | ICD-10-CM | POA: Diagnosis not present

## 2022-01-07 DIAGNOSIS — Z0184 Encounter for antibody response examination: Secondary | ICD-10-CM

## 2022-01-07 DIAGNOSIS — M545 Low back pain, unspecified: Secondary | ICD-10-CM

## 2022-01-07 DIAGNOSIS — M6281 Muscle weakness (generalized): Secondary | ICD-10-CM | POA: Diagnosis not present

## 2022-01-07 NOTE — Addendum Note (Signed)
Addended by: Aliene Altes on: 01/07/2022 01:35 PM   Modules accepted: Orders
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
## 2022-01-07 NOTE — Progress Notes (Signed)
Redraw for rabies titer

## 2022-01-14 ENCOUNTER — Ambulatory Visit: Payer: 59

## 2022-01-15 ENCOUNTER — Ambulatory Visit: Payer: 59

## 2022-01-16 DIAGNOSIS — M5416 Radiculopathy, lumbar region: Secondary | ICD-10-CM | POA: Diagnosis not present

## 2022-01-19 ENCOUNTER — Encounter: Payer: Self-pay | Admitting: Physician Assistant

## 2022-01-19 ENCOUNTER — Ambulatory Visit: Payer: 59 | Admitting: Physician Assistant

## 2022-01-19 VITALS — BP 123/89 | Temp 98.0°F | Resp 14 | Ht 67.0 in | Wt 185.0 lb

## 2022-01-19 DIAGNOSIS — H6591 Unspecified nonsuppurative otitis media, right ear: Secondary | ICD-10-CM

## 2022-01-19 DIAGNOSIS — H6981 Other specified disorders of Eustachian tube, right ear: Secondary | ICD-10-CM

## 2022-01-19 NOTE — Progress Notes (Signed)
   Subjective:    Patient ID: Thomas Mckay, male    DOB: 1986/07/19, 36 y.o.   MRN: [id number]  HPI Thomas Mckay reports history of seasonal allergies for years. Wax and wane. No consistent Rx. Recently had sensation of fullness in his ears, right >left. Thought to be experiencing viral syndrome and/or seasonal allergies.   Rx Flonase- which he has used irregularly partly because he hates the taste. Girlfriend uses Alavert and he has tried her pills once in a while but doesn't feel much different.  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 Uses Q-tips occassionally  Review of Systems As noted , otherwise non-contributory Non-smoker    Objective:   Physical Exam Alert , afebrile in no acute distress Eyes clear, no injection or drainage Left canal and TM WNL R canal clear , R TM bulging , clear, fluid clear, no erythema TM or adjacent tissue Jawline with apparently one small shotty node palpable, minimal tenderness No pain with manipulation pinna No change with swallow Clear nasal discharge if present FROM head, neck without significant discomfort  Dental hygiene encouraged, tartar, no erythema     Assessment & Plan:  Serous effusion Tylenol 2 po TID Flonase 1-2 sprays per nostril BID for 3 days then QD- Taught correct use of nasal spray Allergy Rx of choice Claritin /Zyrtec /Alavert/.Marland KitchenMarland KitchenMarland KitchenEtc 1 a day-consistent dosing Warm showers /inhale steam.  Increase hydration- no soda- drink water Gentle valsalva - gentle blows Windows closed while driving during allergy season- use AC Windows in bedroom as well. RTC for re-evaluation 2 weeks  Serous OM Eustachian Tube Dysfunction

## 2022-01-19 NOTE — Progress Notes (Signed)
Pt was treated 12-04-21 for congestion and cough for a week. Pt stating he has not gotten better since then. Now his right ear, right side throat/neck.

## 2022-01-20 LAB — RABIES NEUT.ABS TITRAT.(RFFIT)

## 2022-01-22 ENCOUNTER — Ambulatory Visit: Payer: 59

## 2022-01-27 ENCOUNTER — Ambulatory Visit: Payer: 59

## 2022-01-29 ENCOUNTER — Telehealth: Payer: Self-pay

## 2022-01-29 ENCOUNTER — Ambulatory Visit: Payer: 59

## 2022-01-29 DIAGNOSIS — C8331 Diffuse large B-cell lymphoma, lymph nodes of head, face, and neck: Secondary | ICD-10-CM | POA: Diagnosis not present

## 2022-01-29 NOTE — Telephone Encounter (Signed)
Author reached out to patient regarding earlier discussions regarding discharge. Pt reports he had hoped to hear back from his physician prior to coming in to, but alas he has not heard back. Pt reports he will elect to cancel today's visit at this time and FU with primary PT once he meets with his doctor.   1:34 PM, 01/29/22 Etta Grandchild, PT, DPT Physical Therapist - [facility] [phone] (Office)

## 2022-02-02 ENCOUNTER — Ambulatory Visit: Payer: 59 | Admitting: Physician Assistant

## 2022-02-03 ENCOUNTER — Ambulatory Visit: Payer: 59

## 2022-02-03 DIAGNOSIS — R262 Difficulty in walking, not elsewhere classified: Secondary | ICD-10-CM

## 2022-02-03 DIAGNOSIS — M6281 Muscle weakness (generalized): Secondary | ICD-10-CM

## 2022-02-03 DIAGNOSIS — M545 Low back pain, unspecified: Secondary | ICD-10-CM

## 2022-02-03 DIAGNOSIS — M5412 Radiculopathy, cervical region: Secondary | ICD-10-CM

## 2022-02-03 DIAGNOSIS — M542 Cervicalgia: Secondary | ICD-10-CM
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
## 2022-02-05 ENCOUNTER — Ambulatory Visit: Payer: 59

## 2022-02-05 DIAGNOSIS — C8511 Unspecified B-cell lymphoma, lymph nodes of head, face, and neck: Secondary | ICD-10-CM | POA: Diagnosis not present

## 2022-02-05 DIAGNOSIS — C8590 Non-Hodgkin lymphoma, unspecified, unspecified site: Secondary | ICD-10-CM | POA: Diagnosis not present

## 2022-02-19 DIAGNOSIS — M5136 Other intervertebral disc degeneration, lumbar region: Secondary | ICD-10-CM | POA: Diagnosis not present

## 2022-02-19 DIAGNOSIS — F419 Anxiety disorder, unspecified: Secondary | ICD-10-CM | POA: Insufficient documentation

## 2022-02-19 DIAGNOSIS — F418 Other specified anxiety disorders: Secondary | ICD-10-CM | POA: Diagnosis not present

## 2022-03-01 ENCOUNTER — Other Ambulatory Visit: Payer: Self-pay | Admitting: Physician Assistant

## 2022-03-01 DIAGNOSIS — J302 Other seasonal allergic rhinitis: Secondary | ICD-10-CM

## 2022-03-10 ENCOUNTER — Other Ambulatory Visit: Payer: 59

## 2022-03-18 DIAGNOSIS — M5416 Radiculopathy, lumbar region: Secondary | ICD-10-CM | POA: Diagnosis not present

## 2022-03-19 ENCOUNTER — Ambulatory Visit: Payer: 59 | Admitting: Nurse Practitioner

## 2022-03-19 ENCOUNTER — Encounter: Payer: Self-pay | Admitting: Nurse Practitioner

## 2022-03-19 DIAGNOSIS — A6001 Herpesviral infection of penis: Secondary | ICD-10-CM

## 2022-03-19 DIAGNOSIS — Z113 Encounter for screening for infections with a predominantly sexual mode of transmission: Secondary | ICD-10-CM

## 2022-03-19 LAB — HM HEPATITIS C SCREENING LAB: HM Hepatitis Screen: NEGATIVE

## 2022-03-19 LAB — HM HIV SCREENING LAB: HM HIV Screening: NEGATIVE

## 2022-03-19 MED ORDER — VALACYCLOVIR HCL 500 MG PO TABS: 500.0000 mg | ORAL_TABLET | Freq: Two times a day (BID) | ORAL | 11 refills | Status: DC

## 2022-03-19 NOTE — Progress Notes (Signed)
[facility] Department STI clinic/screening visit  Subjective:  Thomas Mckay is a 36 y.o. male being seen today for an STI screening visit. The patient reports they do have symptoms.    Patient has the following medical conditions:   Patient Active Problem List   Diagnosis Date Noted   Stiffness of right knee 07/03/2021   Cervical spine pain 04/02/2021   Cervical radiculopathy 04/02/2021   Cervical spondylosis 04/02/2021   Strain of neck muscle 04/02/2021   Right knee pain 12/20/2020   Research study patient 12/06/2019   Abdominal pain 02/22/2019   Proteinuria 02/22/2019   Asymptomatic microscopic hematuria 02/22/2019   Pain in joint involving ankle and foot 02/01/2019   Achilles bursitis 02/01/2019   Achilles tendinitis 02/01/2019   Hip pain 02/01/2019   Lumbar sprain 02/01/2019   Sprain of deltoid ligament of ankle 02/01/2019   Sprain of hand 02/01/2019   Pre-bariatric surgery nutrition evaluation 06/03/2017   Gastroesophageal reflux disease 05/24/2017   Type 2 HSV infection of penis 05/24/2017   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   Shoulder joint pain 05/14/2017   Non-Hodgkin lymphoma ([place]) 04/14/2017   Bilateral carpal tunnel syndrome 01/28/2017   Tear of right glenoid labrum 10/09/2014   Diffuse large B-cell lymphoma of lymph nodes of neck ([place]) 08/02/2014   History of antineoplastic chemotherapy 11/22/2012   History of radiation therapy 11/22/2012   Allergic rhinitis 08/18/2007     Chief Complaint  Patient presents with   SEXUALLY TRANSMITTED DISEASE    Screening- patient states he is having a herpes flare up and he is also having some burning when urinating and small amount of discharge.     HPI  Patient reports to clinic today for STD screening.  Patient reports some discharge that occurred 1 1/2 weeks ago and dysuria that has been present on and off for 3 weeks.  Patient also reports that he has a history of HSV and noticed an outbreak for on  03/14/22.  He states that he took Valtrex and the lesion is improving.  Patient desires another prescription for Valtrex.   Does the patient or their partner desires a pregnancy in the next year? No  Screening for MPX risk: Does the patient have an unexplained rash? No Is the patient MSM? No Does the patient endorse multiple sex partners or anonymous sex partners? No Did the patient have close or sexual contact with a person diagnosed with MPX? No Has the patient traveled outside the [place] where MPX is endemic? No Is there a high clinical suspicion for MPX-- evidenced by one of the following No  -Unlikely to be chickenpox  -Lymphadenopathy  -Rash that present in same phase of evolution on any given body part   See flowsheet for further details and programmatic requirements.   Immunization History  Administered Date(s) Administered   DTP 10/05/1988, 01/02/1992   DTaP 06/07/1987, 08/09/1987, 10/18/1987, 10/05/1988, 01/02/1992   HPV Quadrivalent 01/01/2014   Hepatitis B 09/23/1992, 11/25/1992, 12/08/1993   Hepatitis B, adult 09/23/1992, 11/25/1992, 12/08/1993   HiB (PRP-OMP) 12/16/1988   IPV 06/07/1987, 08/09/1987, 10/18/1987, 09/29/1988, 01/02/1992   Influenza, Seasonal, Injecte, Preservative Fre 07/25/2007, 05/10/2009   Influenza,inj,Quad PF,6+ Mos 04/25/2014   MMR 06/30/1988, 05/13/1992   MODERNA COVID-19 SARS-COV-2 PEDS BIVALENT BOOSTER 6Y-11Y 12/09/2019, 01/08/2020   Meningococcal Conjugate 03/03/2006   OPV 10/05/1988, 01/02/1992   R[place]ies, IM 01/29/2009, 02/22/2009, 03/21/2009, 04/30/2020   Td 12/25/1999   Tdap 10/04/2011, 08/18/2017     The following portions of the patient's history  were reviewed and updated as appropriate: allergies, current medications, past medical history, past social history, past surgical history and problem list.  Objective:  There were no vitals filed for this visit.  Physical Exam Constitutional:      Appearance: Normal appearance.  HENT:      Head: Normocephalic. No abrasion, masses or laceration. Hair is normal.     Right Ear: External ear normal.     Left Ear: External ear normal.     Nose: Nose normal.     Mouth/Throat:     Lips: Pink.     Mouth: Mucous membranes are moist. No oral lesions.     Pharynx: No pharyngeal swelling, oropharyngeal exudate, posterior oropharyngeal erythema or uvula swelling.     Tonsils: No tonsillar exudate or tonsillar abscesses.     Comments: No visible signs of dental caries  Eyes:     General: Lids are normal.        Right eye: No discharge.        Left eye: No discharge.     Conjunctiva/sclera: Conjunctivae normal.     Right eye: No exudate.    Left eye: No exudate. Abdominal:     General: Abdomen is flat.     Palpations: Abdomen is soft.     Tenderness: There is no abdominal tenderness. There is no rebound.  Genitourinary:    Pubic Area: No rash or pubic lice.      Penis: Normal and circumcised. No erythema or discharge.      Testes: Normal.        Right: Mass or tenderness not present.        Left: Mass or tenderness not present.     Rectum: Normal.     Comments: Discharge amount: None Color: None  Small lesion on the posterior head of penis.  Musculoskeletal:     Cervical back: Full passive range of motion without pain, normal range of motion and neck supple.  Lymphadenopathy:     Cervical: No cervical adenopathy.     Right cervical: No superficial, deep or posterior cervical adenopathy.    Left cervical: No superficial, deep or posterior cervical adenopathy.     Upper Body:     Right upper body: No supraclavicular, axillary or epitrochlear adenopathy.     Left upper body: No supraclavicular, axillary or epitrochlear adenopathy.     Lower Body: No right inguinal adenopathy. No left inguinal adenopathy.  Skin:    General: Skin is warm and dry.     Findings: No lesion or rash.  Neurological:     Mental Status: He is alert and oriented to person, place, and time.   Psychiatric:        Attention and Perception: Attention normal.        Mood and Affect: Mood normal.        Speech: Speech normal.        Behavior: Behavior normal. Behavior is cooperative.       Assessment and Plan:  [name] is a 36 y.o. male presenting to the [facility] Department for STI screening  1. Screening examination for venereal disease -36 year old male in clinic today for STD screening. -Patient does have STI symptoms Patient accepted all screenings including  oral, urine CT/GC, gram stain and bloodwork for HIV/RPR.  Patient meets criteria for HepB screening? No. Ordered? No - low risk Patient meets criteria for HepC screening? Yes. Ordered? Yes Recommended condom use with all sex Discussed  importance of condom use for STI prevent  Treat gram stain per standing order Discussed time line for [facility] results and that patient will be called with positive results and encouraged patient to call if he had not heard in 2 weeks Recommended returning for continued or worsening symptoms.    - HIV/HCV [place] [facility] - Syphilis Serology, [place] [facility] - Chlamydia/Gonorrhea [place] [facility] - Chlamydia/GC NAA, Confirmation - Gram stain   2. Type 2 HSV infection of penis -History of HSV, prescription submitted to pharmacy for Valtrex.   - valACYclovir (VALTREX) 500 MG tablet; Take 1 tablet (500 mg total) by mouth 2 (two) times daily.  Dispense: 6 tablet; Refill: 11    Total time spent: 30 minutes   Return if symptoms worsen or fail to improve.    Gregary Cromer, FNP

## 2022-03-19 NOTE — Progress Notes (Signed)
Pt here for STD screening.  Gram stain results reviewed, no treatment required per Provider.  Condoms declined.  Windle Guard, RN

## 2022-03-20 LAB — GRAM STAIN

## 2022-03-22 LAB — CHLAMYDIA/GC NAA, CONFIRMATION
Chlamydia trachomatis, NAA: NEGATIVE
Neisseria gonorrhoeae, NAA: NEGATIVE

## 2022-04-09 DIAGNOSIS — M25561 Pain in right knee: Secondary | ICD-10-CM | POA: Diagnosis not present

## 2022-04-09 DIAGNOSIS — F418 Other specified anxiety disorders: Secondary | ICD-10-CM | POA: Diagnosis not present

## 2022-04-09 DIAGNOSIS — M5136 Other intervertebral disc degeneration, lumbar region: Secondary | ICD-10-CM | POA: Diagnosis not present

## 2022-04-28 DIAGNOSIS — M25562 Pain in left knee: Secondary | ICD-10-CM | POA: Diagnosis not present

## 2022-04-28 DIAGNOSIS — M25561 Pain in right knee: Secondary | ICD-10-CM | POA: Diagnosis not present

## 2022-05-01 DIAGNOSIS — M25562 Pain in left knee: Secondary | ICD-10-CM | POA: Diagnosis not present

## 2022-05-01 DIAGNOSIS — M25561 Pain in right knee: Secondary | ICD-10-CM | POA: Diagnosis not present

## 2022-05-07 ENCOUNTER — Other Ambulatory Visit: Payer: Self-pay | Admitting: Physician Assistant

## 2022-05-07 DIAGNOSIS — K219 Gastro-esophageal reflux disease without esophagitis: Secondary | ICD-10-CM

## 2022-05-13 ENCOUNTER — Other Ambulatory Visit: Payer: Self-pay

## 2022-05-13 MED ORDER — OMEPRAZOLE 40 MG PO CPDR: 40.0000 mg | DELAYED_RELEASE_CAPSULE | Freq: Every day | ORAL | 2 refills | Status: DC

## 2022-05-27 DIAGNOSIS — M25561 Pain in right knee: Secondary | ICD-10-CM | POA: Diagnosis not present

## 2022-05-27 DIAGNOSIS — M25562 Pain in left knee: Secondary | ICD-10-CM | POA: Diagnosis not present

## 2022-06-01 DIAGNOSIS — M25562 Pain in left knee: Secondary | ICD-10-CM | POA: Diagnosis not present

## 2022-06-01 DIAGNOSIS — M25561 Pain in right knee: Secondary | ICD-10-CM | POA: Diagnosis not present

## 2022-06-19 DIAGNOSIS — M25562 Pain in left knee: Secondary | ICD-10-CM | POA: Diagnosis not present

## 2022-06-19 DIAGNOSIS — M25561 Pain in right knee: Secondary | ICD-10-CM | POA: Diagnosis not present

## 2022-06-22 ENCOUNTER — Ambulatory Visit: Payer: Self-pay | Admitting: Physician Assistant

## 2022-06-22 ENCOUNTER — Encounter: Payer: Self-pay | Admitting: Physician Assistant

## 2022-06-22 VITALS — BP 134/92 | HR 65 | Temp 98.0°F | Resp 14

## 2022-06-22 DIAGNOSIS — M25561 Pain in right knee: Secondary | ICD-10-CM

## 2022-06-22 NOTE — Progress Notes (Signed)
   Subjective: Bilateral knee pain left greater than right    Patient ID: Thomas Mckay, male    DOB: July 09, 1986, 36 y.o.   MRN: [id number]  HPI Patient complain of bilateral knee pains.  Patient stated he was at work when a dog charged him.  Patient state he was running backwards and had a near fall.  Incident occurred on 06/17/2022.  Patient stated initially he did not feel the need to have a provider evaluate the incident.  Patient stated the last 2 days increased left knee pain.  Patient states pain increases with walking up stairs and prolonged standing.  Patient has a past medical history of bilateral knee pain secondary to inflammation.  Denies loss of sensation or loss of function.  Rates left knee pain as a 6/10.  Rates right knee pain as a 4   Review of Systems Negative except for chief complaint.    Objective:   Physical Exam  BP is 134/92, pulse 65, respiration 14, and temperature is 98. Examination of the bilateral knees shows bony prominence at the insertion point of the inferior patella tendon right knee.  No obvious deformity of the left knee.  There is no edema, erythema, or ecchymosis of the bilateral knee.  No crepitus to palpation bilateral knees.  Full and equal range of motion.  No laxity with stress testing.     Assessment & Plan:   Discussed with patient that this is probably an inflammatory process and will need time and anti-inflammatory medication to resolve.  No indication for imagings at this time.  Will follow-up in 1 week.

## 2022-06-22 NOTE — Progress Notes (Signed)
Stated he hurt himself at work "last Wednesday" on 11/29, but stated he didn't think he needed to see a provider that day or last week.  Stated it got worse the last couple days.  Reported a dog chasing him and he was running backwards and ran into a chair.  Today reports pain mainly in left knee, level pain currently 6, right knee, level pain currently 3.  Observed skin intact to bilateral knees upon assessment.  Stated it happened fast and maybe he grabbed a wall and stated he doesn't remember exactly what happened and stated not sure if he fell.  Reports the work place has cameras and is wondering what happened exactly as he's not sure.  Stated he wants to see provider today because his knees are hurting.  Stated pain is increased when walking down stairs or turning.  Reports having history of knees having injury and pain in past.

## 2022-06-23 ENCOUNTER — Other Ambulatory Visit: Payer: Self-pay | Admitting: Physician Assistant

## 2022-06-23 MED ORDER — NAPROXEN 500 MG PO TABS: 500.0000 mg | ORAL_TABLET | Freq: Two times a day (BID) | ORAL | Status: DC

## 2022-06-24 DIAGNOSIS — M25562 Pain in left knee: Secondary | ICD-10-CM | POA: Diagnosis not present

## 2022-06-24 DIAGNOSIS — M25561 Pain in right knee: Secondary | ICD-10-CM | POA: Diagnosis not present

## 2022-06-29 ENCOUNTER — Encounter: Payer: Self-pay | Admitting: Physician Assistant

## 2022-06-29 ENCOUNTER — Ambulatory Visit: Payer: Self-pay | Admitting: Physician Assistant

## 2022-06-29 VITALS — BP 140/96 | HR 66 | Temp 97.2°F | Resp 12

## 2022-06-29 DIAGNOSIS — M25562 Pain in left knee: Secondary | ICD-10-CM | POA: Diagnosis not present

## 2022-06-29 DIAGNOSIS — G8929 Other chronic pain: Secondary | ICD-10-CM

## 2022-06-29 DIAGNOSIS — M25561 Pain in right knee: Secondary | ICD-10-CM | POA: Diagnosis not present

## 2022-06-29 NOTE — Progress Notes (Signed)
States "pain is a little bit better". Thinks the medicine is helping. When "walk fast, or going up & down stairs, it makes the pain worse".  AMD

## 2022-06-29 NOTE — Progress Notes (Signed)
   Subjective: Bilateral knee pain    Patient ID: Thomas Mckay, male    DOB: [birth year], 36 y.o.   MRN: [id number]  HPI Patient is follow-up bilateral knee pain secondary to a near fall which occurred on 06/17/2022.  Patient states mild improvement with anti-inflammatory medication.  Patient states pain increases with going up and down stairs and walking fast.  Patient has a history of bilateral knee pain secondary to inflammatory processes.   Review of Systems Negative except for chief complaint    Objective:   Physical Exam No acute distress.  Ambulates with normal gait. BP is 136/99, pulse of 66, respiration 12, temperature 97.2, patient 96% O2 sat on room air. No obvious deformity to the bilateral knees.  No edema, erythema or ecchymosis.  Neurovascular intact with free and equal range of motion.  No crepitus with palpation.  No laxity with stress testing.       Assessment & Plan:   Advised to continue anti-inflammatory medications.  Patient given bilateral elastic knee support to wear while at work.  Follow-up if condition worsens.

## 2022-07-02 DIAGNOSIS — M25562 Pain in left knee: Secondary | ICD-10-CM | POA: Diagnosis not present

## 2022-07-02 DIAGNOSIS — M25561 Pain in right knee: Secondary | ICD-10-CM | POA: Diagnosis not present

## 2022-07-09 DIAGNOSIS — M25562 Pain in left knee: Secondary | ICD-10-CM | POA: Diagnosis not present

## 2022-07-09 DIAGNOSIS — M25561 Pain in right knee: Secondary | ICD-10-CM | POA: Diagnosis not present

## 2022-07-15 DIAGNOSIS — M25562 Pain in left knee: Secondary | ICD-10-CM | POA: Diagnosis not present

## 2022-07-15 DIAGNOSIS — M25561 Pain in right knee: Secondary | ICD-10-CM | POA: Diagnosis not present

## 2022-07-17 DIAGNOSIS — M25562 Pain in left knee: Secondary | ICD-10-CM | POA: Diagnosis not present

## 2022-07-17 DIAGNOSIS — M25561 Pain in right knee: Secondary | ICD-10-CM | POA: Diagnosis not present

## 2022-08-07 ENCOUNTER — Other Ambulatory Visit: Payer: Self-pay | Admitting: Physician Assistant

## 2022-09-02 ENCOUNTER — Encounter: Payer: Self-pay | Admitting: Physician Assistant

## 2022-09-02 ENCOUNTER — Ambulatory Visit: Payer: Self-pay | Admitting: Physician Assistant

## 2022-09-02 VITALS — BP 143/99 | HR 77 | Temp 98.0°F | Resp 14 | Wt 196.0 lb

## 2022-09-02 DIAGNOSIS — S93401A Sprain of unspecified ligament of right ankle, initial encounter: Secondary | ICD-10-CM

## 2022-09-02 NOTE — Progress Notes (Signed)
Reports old injury to right ankle area years ago in basketball being aggravated this week since Monday during activity.  Stated he rested yesterday and noted improvement with anti inflammatory and wearing a soft support to right ankle from the past.  No edema noted to right ankle and skin intact.  Noted ambulating in tennis shoes and no c/o nor any change noted in gait.

## 2022-09-02 NOTE — Progress Notes (Signed)
   Subjective: Right ankle pain    Patient ID: Thomas Mckay, male    DOB: Dec 02, 1985, 37 y.o.   MRN: [id number]  HPI Patient complaining of lateral medial right ankle pain secondary to a twisting incident while removing a dog from the vehicle.  Incident occurred 2 days ago.  Patient is wearing elastic support and is taking over-the-counter anti-inflammatory medication with moderate relief.  States first day weightbearing was difficult but has improved in the past 24 hours.  History of previous injury.   Review of Systems Allergic rhinitis and GERD    Objective:   Physical Exam No acute distress.  Normal gait. BP is 143/99, pulse 77, respiration 14, temperature 98, patient 95% O2 sat on room air.  Patient weighs 196 pounds and BMI is 30.70. No obvious deformity to the right ankle.  No edema or erythema.  Moderate guarding palpation of medial and lateral posterior malleolus.  Full and equal range of motion.       Assessment & Plan: Right ankle sprain   Patient given elastic ankle support and OTC naproxen.  Follow-up if condition worsens.

## 2022-09-14 ENCOUNTER — Ambulatory Visit: Payer: 59 | Admitting: Physician Assistant

## 2022-09-14 ENCOUNTER — Encounter: Payer: Self-pay | Admitting: Physician Assistant

## 2022-09-14 VITALS — BP 143/93 | HR 92 | Temp 98.2°F | Resp 14

## 2022-09-14 DIAGNOSIS — W540XXA Bitten by dog, initial encounter: Secondary | ICD-10-CM

## 2022-09-14 DIAGNOSIS — T148XXA Other injury of unspecified body region, initial encounter: Secondary | ICD-10-CM

## 2022-09-14 NOTE — Progress Notes (Signed)
   Subjective: Dog bite.  Pain    Patient ID: Thomas Mckay, male    DOB: Sep 18, 1985, 37 y.o.   MRN: [id number]  HPI Patient presents with superficial abrasion to the dorsal aspect of right wrist.  Incident secondary to a dog bite.  Denies loss sensation or loss of function.  Patient stated dog immunizations are up-to-date.  Patient immunizations up-to-date.  Minimal bleeding onset of incident.  Area was cleaned with iodine and normal saline.  Patient states he bandaged at work but removed it prior to arrival.   Review of Systems Negative except for chief complaint    Objective:   Physical Exam BP is 143/93, pulse 92, respiration 14, temperature 98.2, patient is 96% O2 sat on room air. Patient is right-hand dominant.  No obvious deformity.  Skin is intact except for mild abrasion on dorsal aspect of right wrist.  Neurovascular intact.  Free and active passive range of motion.  Decreased decreased active range of motion.     Assessment & Plan: Skin abrasion secondary to dog bite   Advised conservative treatment.  Follow-up if condition worsens.

## 2022-09-14 NOTE — Progress Notes (Signed)
Workers comp visit for event from today.  Stated while working with a dog, the dog unexpectedly bit dorsal side of proximal right hand.  Stated washed area with soap and water after event.  Stated mild discomfort to that area upon movement with flexing hand/wrist and noted abrasion to that area approximately 1.5cm.  Area re cleansed with betadine and saline.

## 2022-11-02 ENCOUNTER — Ambulatory Visit: Payer: Self-pay

## 2022-11-02 DIAGNOSIS — Z0000 Encounter for general adult medical examination without abnormal findings: Secondary | ICD-10-CM

## 2022-11-02 LAB — POCT URINALYSIS DIPSTICK
Bilirubin, UA: NEGATIVE
Glucose, UA: NEGATIVE
Ketones, UA: NEGATIVE
Leukocytes, UA: NEGATIVE
Nitrite, UA: NEGATIVE
Protein, UA: POSITIVE — AB
Spec Grav, UA: 1.02 (ref 1.010–1.025)
Urobilinogen, UA: 0.2 E.U./dL
pH, UA: 6 (ref 5.0–8.0)

## 2022-11-03 LAB — CMP12+LP+TP+TSH+6AC+CBC/D/PLT
ALT: 34 IU/L (ref 0–44)
AST: 26 IU/L (ref 0–40)
Albumin/Globulin Ratio: 1.5 (ref 1.2–2.2)
Albumin: 4.3 g/dL (ref 4.1–5.1)
Alkaline Phosphatase: 85 IU/L (ref 44–121)
BUN/Creatinine Ratio: 7 — ABNORMAL LOW (ref 9–20)
BUN: 7 mg/dL (ref 6–20)
Basophils Absolute: 0 10*3/uL (ref 0.0–0.2)
Basos: 1 %
Bilirubin Total: 1.2 mg/dL (ref 0.0–1.2)
Calcium: 8.9 mg/dL (ref 8.7–10.2)
Chloride: 104 mmol/L (ref 96–106)
Chol/HDL Ratio: 4.3 ratio (ref 0.0–5.0)
Cholesterol, Total: 147 mg/dL (ref 100–199)
Creatinine, Ser: 0.95 mg/dL (ref 0.76–1.27)
EOS (ABSOLUTE): 0.2 10*3/uL (ref 0.0–0.4)
Eos: 4 %
Estimated CHD Risk: 0.8 times avg. (ref 0.0–1.0)
Free Thyroxine Index: 1.7 (ref 1.2–4.9)
GGT: 27 IU/L (ref 0–65)
Globulin, Total: 2.9 g/dL (ref 1.5–4.5)
Glucose: 95 mg/dL (ref 70–99)
HDL: 34 mg/dL — ABNORMAL LOW (ref >39)
Hematocrit: 45.7 % (ref 37.5–51.0)
Hemoglobin: 15.6 g/dL (ref 13.0–17.7)
Immature Grans (Abs): 0 10*3/uL (ref 0.0–0.1)
Immature Granulocytes: 1 %
Iron: 86 ug/dL (ref 38–169)
LDH: 207 IU/L (ref 121–224)
LDL Chol Calc (NIH): 89 mg/dL (ref 0–99)
Lymphocytes Absolute: 1.7 10*3/uL (ref 0.7–3.1)
Lymphs: 40 %
MCH: 28.9 pg (ref 26.6–33.0)
MCHC: 34.1 g/dL (ref 31.5–35.7)
MCV: 85 fL (ref 79–97)
Monocytes Absolute: 0.3 10*3/uL (ref 0.1–0.9)
Monocytes: 6 %
Neutrophils Absolute: 2.1 10*3/uL (ref 1.4–7.0)
Neutrophils: 48 %
Phosphorus: 2.1 mg/dL — ABNORMAL LOW (ref 2.8–4.1)
Platelets: 153 10*3/uL (ref 150–450)
Potassium: 4.1 mmol/L (ref 3.5–5.2)
RBC: 5.39 x10E6/uL (ref 4.14–5.80)
RDW: 11.9 % (ref 11.6–15.4)
Sodium: 140 mmol/L (ref 134–144)
T3 Uptake Ratio: 23 % — ABNORMAL LOW (ref 24–39)
T4, Total: 7.4 ug/dL (ref 4.5–12.0)
TSH: 2.33 u[IU]/mL (ref 0.450–4.500)
Total Protein: 7.2 g/dL (ref 6.0–8.5)
Triglycerides: 137 mg/dL (ref 0–149)
Uric Acid: 7.3 mg/dL (ref 3.8–8.4)
VLDL Cholesterol Cal: 24 mg/dL (ref 5–40)
WBC: 4.3 10*3/uL (ref 3.4–10.8)
eGFR: 107 mL/min/{1.73_m2} (ref >59)

## 2022-11-05 ENCOUNTER — Encounter: Payer: 59 | Admitting: Physician Assistant

## 2022-11-30 ENCOUNTER — Encounter: Payer: Self-pay | Admitting: Physician Assistant

## 2022-11-30 ENCOUNTER — Ambulatory Visit: Payer: Self-pay | Admitting: Physician Assistant

## 2022-11-30 VITALS — BP 123/85 | HR 94 | Temp 97.8°F | Resp 16 | Ht 67.0 in | Wt 195.0 lb

## 2022-11-30 DIAGNOSIS — Z0000 Encounter for general adult medical examination without abnormal findings: Secondary | ICD-10-CM

## 2022-11-30 NOTE — Progress Notes (Signed)
[facility]  ____________________________________________   None    (approximate)  I have reviewed the triage vital signs and the nursing notes.   HISTORY  Chief Complaint No chief complaint on file.  { HPI Thomas Mckay is a 37 y.o. male patient for annual physical exam.  Patient complaining of left elbow pain secondary to contusion that occurred 2 weeks ago.  Patient also has an insect bite to the inferior left axillary area.  Patient states he tried to express material from the bite with no success.         Past Medical History:  Diagnosis Date   Arthritis    wrists   History of chlamydia    History of herpes genitalis    Non Hodgkin's lymphoma (HCC)    in remission. completed treatments 08/2012   Seizures (HCC)    x1. as infant    Patient Active Problem List   Diagnosis Date Noted   Knee pain, bilateral 04/28/2022   Anxiety due to invasive procedure 02/19/2022   Degeneration of lumbar intervertebral disc 02/19/2022   Stiffness of right knee 07/03/2021   Cervical spine pain 04/02/2021   Cervical radiculopathy 04/02/2021   Cervical spondylosis 04/02/2021   Strain of neck muscle 04/02/2021   Right knee pain 12/20/2020   Research study patient 12/06/2019   Abdominal pain 02/22/2019   Proteinuria 02/22/2019   Asymptomatic microscopic hematuria 02/22/2019   Pain in joint involving ankle and foot 02/01/2019   Achilles bursitis 02/01/2019   Achilles tendinitis 02/01/2019   Hip pain 02/01/2019   Lumbar sprain 02/01/2019   Sprain of deltoid ligament of ankle 02/01/2019   Sprain of hand 02/01/2019   Pre-bariatric surgery nutrition evaluation 06/03/2017   Gastroesophageal reflux disease 05/24/2017   Type 2 HSV infection of penis 05/24/2017   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   Shoulder joint pain 05/14/2017   Non-Hodgkin lymphoma (HCC) 04/14/2017   Bilateral carpal tunnel syndrome 01/28/2017   Tear of right glenoid labrum  10/09/2014   Diffuse large B-cell lymphoma of lymph nodes of neck (HCC) 08/02/2014   History of antineoplastic chemotherapy 11/22/2012   History of radiation therapy 11/22/2012   Allergic rhinitis 08/18/2007    Past Surgical History:  Procedure Laterality Date   LYMPH NODE BIOPSY     SHOULDER ARTHROSCOPY WITH LABRAL REPAIR Right 06/14/2017   Procedure: SHOULDER ARTHROSCOPY WITH LABRAL REPAIR and subacromial debridement.;  Surgeon: Juanell Fairly, MD;  Location: [facility];  Service: Orthopedics;  Laterality: Right;   WISDOM TOOTH EXTRACTION      Prior to Admission medications   Medication Sig Start Date End Date Taking? Authorizing Provider  diclofenac (VOLTAREN) 50 MG EC tablet Take 50 mg by mouth as needed for moderate pain (from [facility]).    [provider]  fluticasone (FLONASE) 50 MCG/ACT nasal spray Place 2 sprays into both nostrils daily. 05/02/21   Joni Reining, PA-C  naproxen (NAPROSYN) 500 MG tablet Take 1 tablet (500 mg total) by mouth 2 (two) times daily with a meal. Patient not taking: Reported on 09/02/2022 06/23/22   Joni Reining, PA-C  omeprazole (PRILOSEC) 40 MG capsule Take 1 capsule (40 mg total) by mouth daily. 05/13/22   Joni Reining, PA-C  valACYclovir (VALTREX) 500 MG tablet Take 1 tablet by mouth 2 (two) times daily.    [provider]  gabapentin (NEURONTIN) 300 MG capsule gabapentin 300 mg capsule  07/04/19  [provider]    Allergies  Patient has no known allergies.  Family History  Problem Relation Age of Onset   Asthma Mother    Hypertension Father    Asthma Brother     Social History Social History   Tobacco Use   Smoking status: Never   Smokeless tobacco: Never  Vaping Use   Vaping Use: Never used  Substance Use Topics   Alcohol use: Yes    Comment: rare - Holidays   Drug use: Never    Review of Systems Constitutional: No fever/chills Eyes: No visual changes. ENT: No sore  throat. Cardiovascular: Denies chest pain. Respiratory: Denies shortness of breath. Gastrointestinal: No abdominal pain.  No nausea, no vomiting.  No diarrhea.  No constipation. Genitourinary: Negative for dysuria.  Asymptomatic hematuria/proteinuria Musculoskeletal: Negative for back pain.  Left posterior elbow pain Skin: Negative for rash.  Insect bite left inferior axillary area Neurological: Negative for headaches, focal weakness or numbness. ____________________________________________   PHYSICAL EXAM:  VITAL SIGNS: BP 123/85  Pulse 94  Resp 16  Temp 97.8 F (36.6 C)  SpO2 95 %  Weight 195 lb (88.5 kg)  Height 5\' 7"  (1.702 m)   BMI 30.54 kg/m2  BSA 2.05 m2  Tobacco  Smoking status Never  Smokeless status Never  Constitutional: Alert and oriented. Well appearing and in no acute distress. Eyes: Conjunctivae are normal. PERRL. EOMI. Head: Atraumatic. Nose: No congestion/rhinnorhea. Mouth/Throat: Mucous membranes are moist.  Oropharynx non-erythematous. Neck: No stridor. No cervical spine tenderness to palpation. Hematological/Lymphatic/Immunilogical: No cervical lymphadenopathy. Cardiovascular: Normal rate, regular rhythm. Grossly normal heart sounds.  Good peripheral circulation. Respiratory: Normal respiratory effort.  No retractions. Lungs CTAB. Gastrointestinal: Soft and nontender. No distention. No abdominal bruits. No CVA tenderness. Genitourinary: Deferred Musculoskeletal: No lower extremity tenderness nor edema.  No joint effusions. Neurologic:  Normal speech and language. No gross focal neurologic deficits are appreciated. No gait instability. Skin:  Skin is warm, dry and intact. No rash noted. Psychiatric: Mood and affect are normal. Speech and behavior are normal.  ____________________________________________   LABS ____________________________    Ref Range & Units 4 wk ago (11/02/22) 11 mo ago (12/30/21) 1 yr ago (01/15/21) 3 yr ago (12/01/19) 3 yr  ago (08/06/19) 3 yr ago (07/04/19) 3 yr ago (03/23/19)  Color, UA Dark Yellow yellow Dark Yellow Dark Yellow   Yellow R  Clarity, UA Clear clear Clear Clear     Glucose, UA Negative Negative Negative Negative Negative     Bilirubin, UA Negative neg Negative Negative   Negative R  Ketones, UA Negative neg Negative Negative   Negative R  Spec Grav, UA 1.010 - 1.025 1.020 1.015 1.025 1.020   1.025 R  Blood, UA Positve neg Positive CM Positive CM   Negative R  Comment: 1+  pH, UA 5.0 - 8.0 6.0 7.0 6.0 6.0   5.5 R  Protein, UA Negative Positive Abnormal  Negative Positive Abnormal  CM Negative   Negative R  Comment: 1+  Urobilinogen, UA 0.2 or 1.0 E.U./dL 0.2 0.2 0.2 0.2     Nitrite, UA Negative neg Negative Negative   Negative R  Leukocytes, UA Negative Negative Negative Negative Negative   Negative  Appearance     CLEAR R CLEAR R   Odor         Resulting Agency     [facility]              Component Ref Range & Units 4 wk ago 11  mo ago 1 yr ago 3 yr ago  Glucose 70 - 99 mg/dL 95 81 82 R 89 R  Uric Acid 3.8 - 8.4 mg/dL 7.3 8.1 CM 6.8 CM 6.3 CM  Comment:            Therapeutic target for gout patients: <6.0  BUN 6 - 20 mg/dL 7 8 10 6   Creatinine, Ser 0.76 - 1.27 mg/dL 9.56 2.13 0.86 5.78  eGFR >59 mL/min/1.73 107 77 104   BUN/Creatinine Ratio 9 - 20 7 Low  6 Low  10 6 Low   Sodium 134 - 144 mmol/L 140 142 142 143  Potassium 3.5 - 5.2 mmol/L 4.1 4.5 4.2 4.3  Chloride 96 - 106 mmol/L 104 105 104 106  Calcium 8.7 - 10.2 mg/dL 8.9 8.9 9.0 8.7  Phosphorus 2.8 - 4.1 mg/dL 2.1 Low  2.7 Low  2.9 2.8  Total Protein 6.0 - 8.5 g/dL 7.2 7.0 7.4 6.9  Albumin 4.1 - 5.1 g/dL 4.3 4.2 R 4.6 R 4.2 R  Globulin, Total 1.5 - 4.5 g/dL 2.9 2.8 2.8 2.7  Albumin/Globulin Ratio 1.2 - 2.2 1.5 1.5 1.6 1.6  Bilirubin Total 0.0 - 1.2 mg/dL 1.2 0.6 1.4 High  1.3 High   Alkaline Phosphatase 44 - 121 IU/L 85 89 85 84 R, CM  LDH 121 - 224 IU/L 207 250 High  193 176   AST 0 - 40 IU/L 26 31 22 28   ALT 0 - 44 IU/L 34 35 30 34  GGT 0 - 65 IU/L 27 30 29 27   Iron 38 - 169 ug/dL 86 95 469 97  Cholesterol, Total 100 - 199 mg/dL 629 528 413 244  Triglycerides 0 - 149 mg/dL 010 61 82 77  HDL >27 mg/dL 34 Low  34 Low  31 Low  33 Low   VLDL Cholesterol Cal 5 - 40 mg/dL 24 13 16 16   LDL Chol Calc (NIH) 0 - 99 mg/dL 89 82 81 74  Chol/HDL Ratio 0.0 - 5.0 ratio 4.3 3.8 CM 4.1 CM 3.7 CM  Comment:                                   T. Chol/HDL Ratio                                             Men  Women                               1/2 Avg.Risk  3.4    3.3                                   Avg.Risk  5.0    4.4                                2X Avg.Risk  9.6    7.1                                3X Avg.Risk 23.4   11.0  Estimated CHD Risk 0.0 -  1.0 times avg. 0.8 0.7 CM 0.8 CM 0.6 CM  Comment: The CHD Risk is based on the T. Chol/HDL ratio. Other factors affect CHD Risk such as hypertension, smoking, diabetes, severe obesity, and family history of premature CHD.  TSH 0.450 - 4.500 uIU/mL 2.330 2.270 2.770 1.800  T4, Total 4.5 - 12.0 ug/dL 7.4 8.6 8.5 6.7  T3 Uptake Ratio 24 - 39 % 23 Low  26 22 Low  25  Free Thyroxine Index 1.2 - 4.9 1.7 2.2 1.9 1.7  WBC 3.4 - 10.8 x10E3/uL 4.3 3.6 3.9 4.0  RBC 4.14 - 5.80 x10E6/uL 5.39 4.74 5.53 5.22  Hemoglobin 13.0 - 17.7 g/dL 62.9 52.8 41.3 24.4  Hematocrit 37.5 - 51.0 % 45.7 39.4 45.4 44.2  MCV 79 - 97 fL 85 83 82 85  MCH 26.6 - 33.0 pg 28.9 27.6 28.4 28.7  MCHC 31.5 - 35.7 g/dL 01.0 27.2 53.6 64.4  RDW 11.6 - 15.4 % 11.9 12.1 11.7 12.1  Platelets 150 - 450 x10E3/uL 153 224 154 151  Neutrophils Not Estab. % 48 51 38 46  Lymphs Not Estab. % 40 38 47 40  Monocytes Not Estab. % 6 7 7 8   Eos Not Estab. % 4 3 7 4   Basos Not Estab. % 1 1 1 1   Neutrophils Absolute 1.4 - 7.0 x10E3/uL 2.1 1.8 1.5 1.9  Lymphocytes Absolute 0.7 - 3.1 x10E3/uL 1.7 1.4 1.9 1.6  Monocytes Absolute 0.1 - 0.9 x10E3/uL  0.3 0.2 0.3 0.3  EOS (ABSOLUTE) 0.0 - 0.4 x10E3/uL 0.2 0.1 0.3 0.2  Basophils Absolute 0.0 - 0.2 x10E3/uL 0.0 0.0 0.0 0.0  Immature Granulocytes Not Estab. % 1 0 0 1  Immature Grans (Abs)            ____________________________________________   INITIAL IMPRESSION / ASSESSMENT AND PLAN As part of my medical decision making, I reviewed the following data within the electronic MEDICAL RECORD NUMBER      No acute findings on physical exam.        ____________________________________________   FINAL CLINICAL IMPRESSION Well exam.  Left elbow bursitis.  Insect bite  ED Discharge Orders     None        Note:  This document was prepared using Dragon voice recognition software and may include unintentional dictation errors.

## 2022-11-30 NOTE — Progress Notes (Signed)
Here for yearly physical with [employer].

## 2022-12-15 ENCOUNTER — Ambulatory Visit: Payer: Self-pay | Admitting: Physician Assistant

## 2022-12-15 ENCOUNTER — Encounter: Payer: Self-pay | Admitting: Physician Assistant

## 2022-12-15 VITALS — BP 131/89 | HR 78 | Temp 97.8°F | Resp 16 | Wt 195.0 lb

## 2022-12-15 DIAGNOSIS — S39011A Strain of muscle, fascia and tendon of abdomen, initial encounter: Secondary | ICD-10-CM

## 2022-12-15 NOTE — Progress Notes (Signed)
Stated he's concerned he may have an umbilical hernia and points to skin at umbilicus and this has only been noticed for a couple of days.  Stated pain at rest seated at exam table level 2.  Stated some increased discomfort with moving and feels like he did a lot of sit ups and some nausea at times and some dizziness at random times.  Stated taking Prilosec some days, but did take it today.  Stated skips meals and breakfast some days and reports poor water intake.

## 2022-12-15 NOTE — Progress Notes (Signed)
   Subjective: Suspected hernia    Patient ID: Thomas Mckay, male    DOB: 15-Dec-1985, 37 y.o.   MRN: [id number]  HPI Patient complains of mid abdominal pain for the past week.  Patient suspects he has a hernia.  Patient past medical history of open ultrasound of the abdomen area showing a small well-circumscribed nodule echodensity in the umbilicus recently in 2020.  Image did not seem to represent herniated bowel.  No further complaint of workup since 2020.  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  Patient also complain of intermitting nausea and vertigo position change.   Review of Systems Allergic rhinitis and GERD.    Objective:   Physical Exam      Orthostatic BP 128/83 125/82 134/91  Patient Position Supine Sitting   Orthostatic Pulse 71 71 74  Resp 16    SpO2 95 % 95 %    Examination of the umbilical area reveals no edema or erythema.  Normoactive bowel sounds.  No palpable lesion with straining.  Nontender to palpation.  Patient states complaint increased coming from a supine to sitting position.      Assessment & Plan: Abdominal strain  Patient advised conservative treatment consisting mostly of decreased physical activity and heat application to the area.  Patient also advised to increase his fluid intake.  Follow-up in 2 to 3 days if no improvement or worsening complaint.
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
## 2023-02-04 DIAGNOSIS — C8590 Non-Hodgkin lymphoma, unspecified, unspecified site: Secondary | ICD-10-CM | POA: Diagnosis not present

## 2023-02-04 DIAGNOSIS — C8511 Unspecified B-cell lymphoma, lymph nodes of head, face, and neck: Secondary | ICD-10-CM | POA: Diagnosis not present

## 2023-03-28 ENCOUNTER — Other Ambulatory Visit: Payer: Self-pay | Admitting: Nurse Practitioner

## 2023-03-28 DIAGNOSIS — A6001 Herpesviral infection of penis: Secondary | ICD-10-CM

## 2023-04-07 ENCOUNTER — Other Ambulatory Visit: Payer: Self-pay

## 2023-04-07 DIAGNOSIS — K219 Gastro-esophageal reflux disease without esophagitis: Secondary | ICD-10-CM

## 2023-04-07 MED ORDER — OMEPRAZOLE 40 MG PO CPDR: 40.0000 mg | DELAYED_RELEASE_CAPSULE | Freq: Every day | ORAL | 3 refills | Status: AC

## 2023-04-08 ENCOUNTER — Telehealth: Payer: Self-pay

## 2023-04-08 ENCOUNTER — Other Ambulatory Visit: Payer: Self-pay | Admitting: Physician Assistant

## 2023-04-08 MED ORDER — VALACYCLOVIR HCL 1 G PO TABS: 1000.0000 mg | ORAL_TABLET | Freq: Two times a day (BID) | ORAL | 0 refills | Status: DC

## 2023-04-08 NOTE — Telephone Encounter (Signed)
Called to request Valtrex and no script on our file, so communication request sent to provider.

## 2023-06-02 ENCOUNTER — Encounter: Payer: Self-pay | Admitting: Physician Assistant

## 2023-06-02 ENCOUNTER — Ambulatory Visit: Payer: Self-pay | Admitting: Physician Assistant

## 2023-06-02 VITALS — BP 146/90 | HR 78 | Resp 14 | Ht 67.0 in | Wt 195.0 lb

## 2023-06-02 DIAGNOSIS — M79642 Pain in left hand: Secondary | ICD-10-CM

## 2023-06-02 NOTE — Progress Notes (Signed)
   Subjective: Left hand pain    Patient ID: Thomas Mckay, male    DOB: [birth year], 37 y.o.   MRN: [id number]  HPI Patient complain of left hand pain, intermittent numbness, is a loss of strength with gripping.  Complaint is ongoing for 1 week.  Patient states no provocative incident for complaint.  Patient is right-hand dominant.  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   Review of Systems Negative except for above complaint    Objective:   Physical Exam BP 146/90  Pulse 78  Resp 14  SpO2 95 %  Weight 195 lb (88.5 kg)  Height 5\' 7"  (1.702 m)  No acute distress.  Examination of the left hand reveals no obvious deformity, edema, erythema, or ecchymosis.  Palpable distal pulses and capillary refill is brisk.  Patient has full and equal range of motion of the left hand.  Grip strength is questionably 4/5 in comparison to the right.       Assessment & Plan:   Advised patient I will get a PT/OT assessment for further evaluation.

## 2023-06-02 NOTE — Progress Notes (Signed)
Pt presents today with having pain in left hand for about a week. Losing grip and flexing hand out causes pain.

## 2023-06-03 NOTE — Addendum Note (Signed)
Addended by: Gardner Candle on: 06/03/2023 04:42 PM   Modules accepted: Orders

## 2023-06-15 ENCOUNTER — Ambulatory Visit: Payer: 59 | Attending: Physician Assistant | Admitting: Occupational Therapy

## 2023-06-15 ENCOUNTER — Encounter: Payer: Self-pay | Admitting: Occupational Therapy

## 2023-06-15 DIAGNOSIS — M79642 Pain in left hand: Secondary | ICD-10-CM | POA: Diagnosis not present

## 2023-06-15 DIAGNOSIS — M79641 Pain in right hand: Secondary | ICD-10-CM | POA: Insufficient documentation

## 2023-06-15 DIAGNOSIS — M6281 Muscle weakness (generalized): Secondary | ICD-10-CM | POA: Insufficient documentation

## 2023-06-15 NOTE — Therapy (Signed)
OUTPATIENT OCCUPATIONAL THERAPY ORTHO EVALUATION  Patient Name: Thomas Mckay MRN: [id number] DOB:March 20, 1986, 37 y.o., male Today's Date: 06/15/2023  PCP: Katrinka Blazing PA REFERRING PROVIDER: Katrinka Blazing PA  END OF SESSION:  OT End of Session - 06/15/23 1556     Visit Number 1    Number of Visits 10    Date for OT Re-Evaluation 07/26/23    OT Start Time 1320    OT Stop Time 1402    OT Time Calculation (min) 42 min    Activity Tolerance Patient tolerated treatment well    Behavior During Therapy WFL for tasks assessed/performed             Past Medical History:  Diagnosis Date   Arthritis    wrists   History of chlamydia    History of herpes genitalis    Non Hodgkin's lymphoma (HCC)    in remission. completed treatments 08/2012   Seizures (HCC)    x1. as infant   Past Surgical History:  Procedure Laterality Date   LYMPH NODE BIOPSY     SHOULDER ARTHROSCOPY WITH LABRAL REPAIR Right 06/14/2017   Procedure: SHOULDER ARTHROSCOPY WITH LABRAL REPAIR and subacromial debridement.;  Surgeon: Juanell Fairly, MD;  Location: [facility];  Service: Orthopedics;  Laterality: Right;   WISDOM TOOTH EXTRACTION     Patient Active Problem List   Diagnosis Date Noted   Knee pain, bilateral 04/28/2022   Anxiety due to invasive procedure 02/19/2022   Degeneration of lumbar intervertebral disc 02/19/2022   Stiffness of right knee 07/03/2021   Cervical spine pain 04/02/2021   Cervical radiculopathy 04/02/2021   Cervical spondylosis 04/02/2021   Strain of neck muscle 04/02/2021   Right knee pain 12/20/2020   Research study patient 12/06/2019   Abdominal pain 02/22/2019   Proteinuria 02/22/2019   Asymptomatic microscopic hematuria 02/22/2019   Pain in joint involving ankle and foot 02/01/2019   Achilles bursitis 02/01/2019   Achilles tendinitis 02/01/2019   Hip pain 02/01/2019   Lumbar sprain 02/01/2019   Sprain of deltoid ligament of ankle 02/01/2019   Sprain of hand 02/01/2019    Pre-bariatric surgery nutrition evaluation 06/03/2017   Gastroesophageal reflux disease 05/24/2017   Type 2 HSV infection of penis 05/24/2017   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   Shoulder joint pain 05/14/2017   Non-Hodgkin lymphoma (HCC) 04/14/2017   Bilateral carpal tunnel syndrome 01/28/2017   Tear of right glenoid labrum 10/09/2014   Diffuse large B-cell lymphoma of lymph nodes of neck (HCC) 08/02/2014   History of antineoplastic chemotherapy 11/22/2012   History of radiation therapy 11/22/2012   Allergic rhinitis 08/18/2007    ONSET DATE: Start Nov  REFERRING DIAG: Bilateral hand pain and weakness  THERAPY DIAG:  Bilateral hand pain  Muscle weakness (generalized)  Rationale for Evaluation and Treatment: Rehabilitation  SUBJECTIVE:   SUBJECTIVE STATEMENT: My hands feel stiff and pins-and-needles in the morning.  The left one worse than the right.  It just feels like weak and tired at times.  Some days better than other days. Pt accompanied by: self  PERTINENT HISTORY: 06/02/23 PA NOTE : Pt presents today with having pain in left hand for about a week. Losing grip and flexing hand out causes pain.  - Refer to OT  PRECAUTIONS: None    WEIGHT BEARING RESTRICTIONS: No  PAIN:  Are you having pain? No more weak and tire feeling  FALLS: Has patient fallen in last 6 months? No  LIVING ENVIRONMENT: Lives with: lives alone  PLOF: Works at the [employer] for the last 6 years.  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  Likes to play basketball sometimes and yard work,  PATIENT GOALS: I just want my hands to feel better to prevent it from getting worse.    OBJECTIVE:  Note: Objective measures were completed at Evaluation unless otherwise noted.  HAND DOMINANCE: Right    UPPER EXTREMITY ROM:     Active ROM Right eval Left eval  Shoulder flexion    Shoulder abduction    Shoulder adduction     Shoulder extension    Shoulder internal rotation    Shoulder external rotation    Elbow flexion    Elbow extension    Wrist flexion 84 open hand 85 open hand   Wrist extension 68 70  Wrist ulnar deviation    Wrist radial deviation    Wrist pronation    Wrist supination    (Blank rows = not tested)  Active ROM Right eval Left eval  Thumb MCP (0-60)    Thumb IP (0-80)    Thumb Radial abd/add (0-55)     Thumb Palmar abd/add (0-45)     Thumb Opposition to Small Finger     Index MCP (0-90)     Index PIP (0-100)     Index DIP (0-70)      Long MCP (0-90)      Long PIP (0-100)      Long DIP (0-70)      Ring MCP (0-90)      Ring PIP (0-100)      Ring DIP (0-70)      Little MCP (0-90)      Little PIP (0-100)      Little DIP (0-70)      (Blank rows = not tested)     HAND FUNCTION: Grip strength: Right: 80 extended arm 65 lbs  lbs; Left: 66  extended arm  85  lbs, Lateral pinch: Right: 21 lbs, Left: 26 lbs, and 3 point pinch: Right: 16 lbs, Left: 18 lbs  Tired and weak feeling in bilateral hands afterwards   COORDINATION: WFL  SENSATION: Report some pins and needles in the am - L hand   EDEMA: tightness in hands and wrist   COGNITION: Overall cognitive status: Within functional limits for tasks assessed    OBSERVATIONS: Patient with increased tightness in the right hand more than the left with extended elbow composite flexion and extension of wrist and hand.  Patient is in digits with limited composite digit extension right worse than left   TODAY'S TREATMENT:                                                                                                                              DATE:06/15/23 Patient fitted with a wrist splint to sleep within the left hand to decrease flexion and carpal tunnel symptoms. Done  [place] #2 2 for sweeping over volar and dorsal forearms and palms prior to review of home exercises. Patient to do contrast morning and evening for  bilateral hands followed by tendon glides with focus on extension. Can do some a rolling over a ball or roller for forearm flexors and extensors prior to review of Composite forearm flexor and extensor stretches 10 reps hold 5 to 10 seconds each. As well as digit hyperextension to focus on in between tendon glides.    PATIENT EDUCATION: Education details: findings of eval and HEP  Person educated: Patient Education method: Explanation, Demonstration, Tactile cues, Verbal cues, and Handouts Education comprehension: verbalized understanding, returned demonstration, verbal cues required, and needs further education   GOALS: Goals reviewed with patient? Yes   LONG TERM GOALS: Target date: 6 wks  Patient independent in home program to improve composite flexion and extension of wrist range of motion with extended arm to within normal limits in bilateral upper extremities. Baseline: Patient with increased stiffness and pain with attempts of composite flexion and extension of wrist with extended elbow on right and left with the right worse than left Goal status: INITIAL  2.  Patient digit flexion extension improved to within normal limits including hyperextension of digits 2 reported decrease of symptoms in the morning as well as with use at work. Baseline: Flexion within normal limits but unable to extend digits fully.  Increased tightness and stiffness in the hands. Goal status: INITIAL  3.  Patient demo verbalized 3 joint protection and modifications to decrease symptoms of carpal tunnel and bilateral hands. Baseline: No knowledge of modifications or joint protection.  Patient has positive Tinel on the left carpal tunnel. Goal status: INITIAL   ASSESSMENT:  CLINICAL IMPRESSION: Patient seen today for occupational therapy evaluation for left hand increased pain and weakness.  Patient reports symptoms started about beginning of November.  Left hand worse than right.  Patient report  feeling of numbness in the morning on the left at times with increased tired and weak feeling in bilateral hands during the day.  Patient with increased tightness in forearm flexors and extensors with the right worse than the left.  Patient with increased stiffness tightness in bilateral hands limiting composite digit extension.  Patient with positive Tinel over left carpal tunnel.  With reports of increased discomfort, pins and needle and weakness in bilateral hands limiting his functional use at work as well as at home.  As also symptoms at nighttime.  Patient can benefit from skilled OT services to decrease stiffness and tightness increase motion and discomfort and pain to return to prior level of function.  PERFORMANCE DEFICITS: in functional skills including ADLs, IADLs, ROM, strength, pain, flexibility, decreased knowledge of use of DME, and UE functional use,   and psychosocial skills including environmental adaptation and routines and behaviors.   IMPAIRMENTS: are limiting patient from ADLs, IADLs, rest and sleep, play, leisure, and social participation.   COMORBIDITIES: has no other co-morbidities that affects occupational performance. Patient will benefit from skilled OT to address above impairments and improve overall function.  MODIFICATION OR ASSISTANCE TO COMPLETE EVALUATION: No modification of tasks or assist necessary to complete an evaluation.  OT OCCUPATIONAL PROFILE AND HISTORY: Problem focused assessment: Including review of records relating to presenting problem.  CLINICAL DECISION MAKING: LOW - limited treatment options, no task modification necessary  REHAB POTENTIAL: Good for goals  EVALUATION COMPLEXITY: Low    PLAN:  OT FREQUENCY: 1x/week  OT DURATION: 6  weeks PLANNING:  Splinting;Paraffin;Fluidtherapy;Contrast Bath;DME and/or AE instruction;Manual Therapy;Passive range of motion;Therapeutic activities, Ultrasound, Iontophoresis, There ex, pt education CONSULTED  AND AGREED WITH PLAN OF CARE: Patient     Oletta Cohn, OTR/L,CLT 06/15/2023, 3:58 PM

## 2023-06-29 ENCOUNTER — Ambulatory Visit: Payer: 59 | Attending: Physician Assistant | Admitting: Occupational Therapy

## 2023-06-29 DIAGNOSIS — M79642 Pain in left hand: Secondary | ICD-10-CM | POA: Diagnosis not present

## 2023-06-29 DIAGNOSIS — M79641 Pain in right hand: Secondary | ICD-10-CM | POA: Diagnosis not present

## 2023-06-29 DIAGNOSIS — M6281 Muscle weakness (generalized): Secondary | ICD-10-CM | POA: Insufficient documentation

## 2023-06-29 NOTE — Therapy (Signed)
OUTPATIENT OCCUPATIONAL THERAPY ORTHO TREATMENT  Patient Name: Thomas Mckay MRN: [id number] DOB:Sep 05, 1985, 37 y.o., male Today's Date: 06/29/2023  PCP: Katrinka Blazing PA REFERRING PROVIDER: Katrinka Blazing PA  END OF SESSION:  OT End of Session - 06/29/23 1351     Visit Number 2    Number of Visits 10    Date for OT Re-Evaluation 07/26/23    OT Start Time 1318    OT Stop Time 1347    OT Time Calculation (min) 29 min    Activity Tolerance Patient tolerated treatment well    Behavior During Therapy WFL for tasks assessed/performed             Past Medical History:  Diagnosis Date   Arthritis    wrists   History of chlamydia    History of herpes genitalis    Non Hodgkin's lymphoma (HCC)    in remission. completed treatments 08/2012   Seizures (HCC)    x1. as infant   Past Surgical History:  Procedure Laterality Date   LYMPH NODE BIOPSY     SHOULDER ARTHROSCOPY WITH LABRAL REPAIR Right 06/14/2017   Procedure: SHOULDER ARTHROSCOPY WITH LABRAL REPAIR and subacromial debridement.;  Surgeon: Juanell Fairly, MD;  Location: [facility];  Service: Orthopedics;  Laterality: Right;   WISDOM TOOTH EXTRACTION     Patient Active Problem List   Diagnosis Date Noted   Knee pain, bilateral 04/28/2022   Anxiety due to invasive procedure 02/19/2022   Degeneration of lumbar intervertebral disc 02/19/2022   Stiffness of right knee 07/03/2021   Cervical spine pain 04/02/2021   Cervical radiculopathy 04/02/2021   Cervical spondylosis 04/02/2021   Strain of neck muscle 04/02/2021   Right knee pain 12/20/2020   Research study patient 12/06/2019   Abdominal pain 02/22/2019   Proteinuria 02/22/2019   Asymptomatic microscopic hematuria 02/22/2019   Pain in joint involving ankle and foot 02/01/2019   Achilles bursitis 02/01/2019   Achilles tendinitis 02/01/2019   Hip pain 02/01/2019   Lumbar sprain 02/01/2019   Sprain of deltoid ligament of ankle 02/01/2019   Sprain of hand 02/01/2019    Pre-bariatric surgery nutrition evaluation 06/03/2017   Gastroesophageal reflux disease 05/24/2017   Type 2 HSV infection of penis 05/24/2017   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   Shoulder joint pain 05/14/2017   Non-Hodgkin lymphoma (HCC) 04/14/2017   Bilateral carpal tunnel syndrome 01/28/2017   Tear of right glenoid labrum 10/09/2014   Diffuse large B-cell lymphoma of lymph nodes of neck (HCC) 08/02/2014   History of antineoplastic chemotherapy 11/22/2012   History of radiation therapy 11/22/2012   Allergic rhinitis 08/18/2007    ONSET DATE: Start Nov  REFERRING DIAG: Bilateral hand pain and weakness  THERAPY DIAG:  Bilateral hand pain  Muscle weakness (generalized)  Rationale for Evaluation and Treatment: Rehabilitation  SUBJECTIVE:   SUBJECTIVE STATEMENT: Doing better- numbness is better and the strength in my hands - also I am paying attention how I grip and hold , pick up - has been going to [employer] more  Pt accompanied by: self  PERTINENT HISTORY: 06/02/23 PA NOTE : Pt presents today with having pain in left hand for about a week. Losing grip and flexing hand out causes pain.  - Refer to OT  PRECAUTIONS: None    WEIGHT BEARING RESTRICTIONS: No  PAIN:  Are you having pain? No  FALLS: Has patient fallen in last 6 months? 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  Likes to play basketball sometimes and yard work,  PATIENT GOALS: I just want my hands to feel better to prevent it from getting worse.    OBJECTIVE:  Note: Objective measures were completed at Evaluation unless otherwise noted.  HAND DOMINANCE: Right    UPPER EXTREMITY ROM:     Active ROM Right eval Left eval R/L 06/29/23  Shoulder flexion     Shoulder abduction     Shoulder adduction     Shoulder extension      Shoulder internal rotation     Shoulder external rotation     Elbow flexion     Elbow extension     Wrist flexion 84 open hand 85 open hand  90/90  Wrist extension 68 70 80/80  Wrist ulnar deviation     Wrist radial deviation     Wrist pronation     Wrist supination     (Blank rows = not tested)  Active ROM Right eval Left eval  Thumb MCP (0-60)    Thumb IP (0-80)    Thumb Radial abd/add (0-55)     Thumb Palmar abd/add (0-45)     Thumb Opposition to Small Finger     Index MCP (0-90)     Index PIP (0-100)     Index DIP (0-70)      Long MCP (0-90)      Long PIP (0-100)      Long DIP (0-70)      Ring MCP (0-90)      Ring PIP (0-100)      Ring DIP (0-70)      Little MCP (0-90)      Little PIP (0-100)      Little DIP (0-70)      (Blank rows = not tested)     HAND FUNCTION: Grip strength: Right: 80 extended arm 65 lbs  lbs; Left: 66  extended arm  85  lbs, Lateral pinch: Right: 21 lbs, Left: 26 lbs, and 3 point pinch: Right: 16 lbs, Left: 18 lbs  Tired and weak feeling in bilateral hands afterwards   06/29/23: Grip strength: Right: 98 extended arm 99 lbs  lbs; Left: 95  extended arm  95  lbs, Lateral pinch: Right: 27 lbs, Left: 26 lbs, and 3 point pinch: Right: 21 lbs, Left: 18 lbs  COORDINATION: WFL  SENSATION: Pins and needles better   EDEMA: tightness in hands and wrist better  COGNITION: Overall cognitive status: Within functional limits for tasks assessed    OBSERVATIONS: Patient with increased tightness in the right hand more than the left with extended elbow composite flexion and extension of wrist and hand.  Patient is in digits with limited composite digit extension right worse than left  NOW _ great progress in flexibility and tightness -    TODAY'S TREATMENT:  DATE:06/29/23  Pt to cont with wrist splint to sleep within the  left hand to decrease flexion and carpal tunnel symptoms. Pt show increase AROM in wrist ext and flexion- great progress in tightness - and stiffness  Strength 5/5 in wrist in all planes  AROM WNL for wrist in all planes   Patient to cont contrast morning and evening for bilateral hands followed by tendon glides with focus on extension. Cont rolling over a ball or roller for forearm flexors and extensors as needed Composite forearm flexor and extensor stretches 10 reps hold 5 to 10 seconds each- extended arm or on wall . As well as digit hyperextension to focus on in between tendon glides.     PATIENT EDUCATION: Education details: findings of eval and HEP  Person educated: Patient Education method: Explanation, Demonstration, Tactile cues, Verbal cues, and Handouts Education comprehension: verbalized understanding, returned demonstration, verbal cues required, and needs further education   GOALS: Goals reviewed with patient? Yes   LONG TERM GOALS: Target date: 6 wks  Patient independent in home program to improve composite flexion and extension of wrist range of motion with extended arm to within normal limits in bilateral upper extremities. Baseline: Patient with increased stiffness and pain with attempts of composite flexion and extension of wrist with extended elbow on right and left with the right worse than left Goal status: INITIAL  2.  Patient digit flexion extension improved to within normal limits including hyperextension of digits 2 reported decrease of symptoms in the morning as well as with use at work. Baseline: Flexion within normal limits but unable to extend digits fully.  Increased tightness and stiffness in the hands. Goal status: INITIAL  3.  Patient demo verbalized 3 joint protection and modifications to decrease symptoms of carpal tunnel and bilateral hands. Baseline: No knowledge of modifications or joint protection.  Patient has positive Tinel on the left  carpal tunnel. Goal status: INITIAL   ASSESSMENT:  CLINICAL IMPRESSION: Patient seen today for occupational therapy evaluation for left hand increased pain and weakness.  Patient reports symptoms started about beginning of November.  Left hand worse than right.  Patient report feeling of numbness in the morning on the left at times with increased tired and weak feeling in bilateral hands during the day.  Patient with increased tightness in forearm flexors and extensors with the right worse than the left.  Patient with increased stiffness tightness in bilateral hands limiting composite digit extension.  Patient with positive Tinel over left carpal tunnel.  Pt done HEP for about 2 wks - great progress in tightness in forearms - great progress in wrist AROM WNL extended arm -and great progress in grip and prehension strength- pt to cont with wrist brace - cont modifications at work and home - HEP to cont with- follow up if needed in 4-6 wks.  Patient can benefit from skilled OT services to decrease stiffness and tightness increase motion and discomfort and pain to return to prior level of function.  PERFORMANCE DEFICITS: in functional skills including ADLs, IADLs, ROM, strength, pain, flexibility, decreased knowledge of use of DME, and UE functional use,   and psychosocial skills including environmental adaptation and routines and behaviors.   IMPAIRMENTS: are limiting patient from ADLs, IADLs, rest and sleep, play, leisure, and social participation.   COMORBIDITIES: has no other co-morbidities that affects occupational performance. Patient will benefit from skilled OT to address above impairments and improve overall function.  MODIFICATION OR ASSISTANCE TO COMPLETE EVALUATION: No  modification of tasks or assist necessary to complete an evaluation.  OT OCCUPATIONAL PROFILE AND HISTORY: Problem focused assessment: Including review of records relating to presenting problem.  CLINICAL DECISION MAKING:  LOW - limited treatment options, no task modification necessary  REHAB POTENTIAL: Good for goals  EVALUATION COMPLEXITY: Low    PLAN:  OT FREQUENCY: 1 visit in 4-6 wks   OT DURATION: 6  weeks PLANNING: Splinting;Paraffin;Fluidtherapy;Contrast Bath;DME and/or AE instruction;Manual Therapy;Passive range of motion;Therapeutic activities, Ultrasound, Iontophoresis, There ex, pt education CONSULTED AND AGREED WITH PLAN OF CARE: Patient     Oletta Cohn, OTR/L,CLT 06/29/2023, 1:52 PM

## 2023-07-26 ENCOUNTER — Other Ambulatory Visit: Payer: Self-pay | Admitting: Physician Assistant

## 2023-08-16 ENCOUNTER — Ambulatory Visit: Payer: Self-pay | Admitting: Physician Assistant

## 2023-08-16 VITALS — BP 145/93 | HR 78 | Temp 98.0°F | Resp 16 | Ht 67.0 in | Wt 197.0 lb

## 2023-08-16 DIAGNOSIS — M545 Low back pain, unspecified: Secondary | ICD-10-CM

## 2023-08-16 MED ORDER — CYCLOBENZAPRINE HCL 5 MG PO TABS: 5.0000 mg | ORAL_TABLET | Freq: Three times a day (TID) | ORAL | 0 refills | Status: DC | PRN

## 2023-08-16 MED ORDER — METHYLPREDNISOLONE 4 MG PO TBPK: ORAL_TABLET | ORAL | 0 refills | Status: DC
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
## 2023-08-16 NOTE — Progress Notes (Signed)
   Acute Office Visit  Subjective:     Patient ID: Thomas Mckay, male    DOB: 09-May-1986, 38 y.o.   MRN: [id number]  Back pain   HPI Patient is in today for evaluation of acute back pain.  Patient states symptoms started approximately a week ago when he working with a dog and fell, landing on his knees.  Complains of hip pain at that time but has resolved.  Complains of persistent pain Responsive.  Denies any lower extremity pain/numbness/tingling/weakness, bladder/bowel dysfunction, or saddle paresthesia.  ROS      Objective:    BP (!) 145/93   Pulse 78   Temp 98 F (36.7 C)   Resp 16   Ht 5\' 7"  (1.702 m)   Wt 197 lb (89.4 kg)   SpO2 93%   BMI 30.85 kg/m    Physical Exam General: Alert and oriented.  Pleasant and cooperative. Gait: Mild antalgic Lumbar spine exam: Inspection: Skin is clean, dry, and intact without rashes or abrasions.  No obvious ecchymosis or erythema. Palpation: Tenderness midline and lower lumbar spine.  No tenderness to the paravertebral muscles. ROM: Full range of motion with lumbar flexion and extension although he is hesitant.  Pain elicited with lateral movements. Strength: 5/5 with hip flexion, quad, hamstring, dorsiflexion Neurovascular: Sensation intact throughout the lower extremities. Special testing: Straight leg raise negative bilaterally.    No results found for any visits on 08/16/23.      Assessment & Plan:  Impression: Acute low back pain Plan: Medrol Dosepak, cyclobenzaprine Physical exam endings with patient.  The patient presents with back pain that started approximately a week ago when he states he fell on his knees when walking with the dog.  At the time he was having bilateral hip pain but that has resolved and is now localized to the back.  Pain is aggravated with straightening his back.  Symptoms consistent with possible musculoskeletal origin.  He has been taking ibuprofen 800 mg twice daily with some relief.   Recommended starting 800 mg 3 times a day with food.  Short-term muscle relaxer also provided.  Medrol Dosepak provided if symptoms not improve over the next couple days.  Recommend follow-up in approximately 7 days for reevaluation additional treatment options.  Patient is understanding agreement of plan.  Problem List Items Addressed This Visit   None Visit Diagnoses       Acute midline low back pain without sciatica    -  Primary       No orders of the defined types were placed in this encounter.   No follow-ups on file.  Ivar Drape

## 2023-08-23 ENCOUNTER — Encounter: Payer: Self-pay | Admitting: Physician Assistant

## 2023-08-23 ENCOUNTER — Ambulatory Visit: Payer: Self-pay | Admitting: Physician Assistant

## 2023-08-23 VITALS — BP 145/85 | HR 75 | Temp 98.2°F | Resp 16

## 2023-08-23 DIAGNOSIS — M545 Low back pain, unspecified: Secondary | ICD-10-CM
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
## 2023-08-23 NOTE — Progress Notes (Signed)
Stated pain in low back continued at present reports level 5 at rest sitting on exam table after ambulating in and stepping on exam table unassisted.  Stated he didn't use the steroid pack he was prescribed last week and stated took his Flexeril while working stating it does not make him sleepy.  He was advised to use this at night as needed.  Stated using Ibuprofen as well.  Reports if exerting himself the low back pain and reach a level of 7-8.  Returns to follow up with provider.  Hx of low back pain and former pain in past with PT in past.  Encouraged to reach out to PT if needed and do home exercises as prescribed in the past.

## 2023-09-06 ENCOUNTER — Other Ambulatory Visit: Payer: Self-pay

## 2023-09-10 ENCOUNTER — Other Ambulatory Visit: Payer: Self-pay

## 2023-09-10 DIAGNOSIS — B009 Herpesviral infection, unspecified: Secondary | ICD-10-CM

## 2023-09-10 MED ORDER — VALACYCLOVIR HCL 1 G PO TABS: 1000.0000 mg | ORAL_TABLET | Freq: Two times a day (BID) | ORAL | 1 refills | Status: DC

## 2023-09-20 ENCOUNTER — Ambulatory Visit: Payer: Self-pay

## 2023-09-20 DIAGNOSIS — Z0000 Encounter for general adult medical examination without abnormal findings: Secondary | ICD-10-CM

## 2023-09-20 LAB — POCT URINALYSIS DIPSTICK
Bilirubin, UA: NEGATIVE
Blood, UA: POSITIVE
Glucose, UA: NEGATIVE
Ketones, UA: NEGATIVE
Leukocytes, UA: NEGATIVE
Nitrite, UA: NEGATIVE
Protein, UA: NEGATIVE
Spec Grav, UA: 1.015 (ref 1.010–1.025)
Urobilinogen, UA: 0.2 U/dL
pH, UA: 6 (ref 5.0–8.0)

## 2023-09-21 LAB — CMP12+LP+TP+TSH+6AC+CBC/D/PLT
ALT: 40 IU/L (ref 0–44)
AST: 30 IU/L (ref 0–40)
Albumin: 4.5 g/dL (ref 4.1–5.1)
Alkaline Phosphatase: 83 IU/L (ref 44–121)
BUN/Creatinine Ratio: 8 — ABNORMAL LOW (ref 9–20)
BUN: 8 mg/dL (ref 6–20)
Basophils Absolute: 0 10*3/uL (ref 0.0–0.2)
Basos: 1 %
Bilirubin Total: 1.2 mg/dL (ref 0.0–1.2)
Calcium: 9.1 mg/dL (ref 8.7–10.2)
Chloride: 106 mmol/L (ref 96–106)
Chol/HDL Ratio: 3.9 ratio (ref 0.0–5.0)
Cholesterol, Total: 135 mg/dL (ref 100–199)
Creatinine, Ser: 0.99 mg/dL (ref 0.76–1.27)
EOS (ABSOLUTE): 0.2 10*3/uL (ref 0.0–0.4)
Eos: 4 %
Estimated CHD Risk: 0.7 times avg. (ref 0.0–1.0)
Free Thyroxine Index: 1.8 (ref 1.2–4.9)
GGT: 25 IU/L (ref 0–65)
Globulin, Total: 2.5 g/dL (ref 1.5–4.5)
Glucose: 87 mg/dL (ref 70–99)
HDL: 35 mg/dL — ABNORMAL LOW (ref >39)
Hematocrit: 46 % (ref 37.5–51.0)
Hemoglobin: 15.4 g/dL (ref 13.0–17.7)
Immature Grans (Abs): 0 10*3/uL (ref 0.0–0.1)
Immature Granulocytes: 0 %
Iron: 115 ug/dL (ref 38–169)
LDH: 210 IU/L (ref 121–224)
LDL Chol Calc (NIH): 83 mg/dL (ref 0–99)
Lymphocytes Absolute: 1.7 10*3/uL (ref 0.7–3.1)
Lymphs: 38 %
MCH: 28.7 pg (ref 26.6–33.0)
MCHC: 33.5 g/dL (ref 31.5–35.7)
MCV: 86 fL (ref 79–97)
Monocytes Absolute: 0.2 10*3/uL (ref 0.1–0.9)
Monocytes: 4 %
Neutrophils Absolute: 2.4 10*3/uL (ref 1.4–7.0)
Neutrophils: 53 %
Phosphorus: 3.1 mg/dL (ref 2.8–4.1)
Platelets: 157 10*3/uL (ref 150–450)
Potassium: 4.1 mmol/L (ref 3.5–5.2)
RBC: 5.36 x10E6/uL (ref 4.14–5.80)
RDW: 12.2 % (ref 11.6–15.4)
Sodium: 142 mmol/L (ref 134–144)
T3 Uptake Ratio: 24 % (ref 24–39)
T4, Total: 7.4 ug/dL (ref 4.5–12.0)
TSH: 2.56 u[IU]/mL (ref 0.450–4.500)
Total Protein: 7 g/dL (ref 6.0–8.5)
Triglycerides: 89 mg/dL (ref 0–149)
Uric Acid: 5.9 mg/dL (ref 3.8–8.4)
VLDL Cholesterol Cal: 17 mg/dL (ref 5–40)
WBC: 4.5 10*3/uL (ref 3.4–10.8)
eGFR: 101 mL/min/{1.73_m2} (ref >59)

## 2023-09-28 ENCOUNTER — Encounter: Payer: 59 | Admitting: Physician Assistant

## 2023-10-20 ENCOUNTER — Encounter: Admitting: Physician Assistant

## 2023-10-21 ENCOUNTER — Encounter: Payer: Self-pay | Admitting: Physician Assistant

## 2023-10-21 ENCOUNTER — Ambulatory Visit: Payer: Self-pay | Admitting: Physician Assistant

## 2023-10-21 VITALS — BP 123/85 | HR 74 | Temp 98.4°F | Resp 16 | Ht 67.0 in | Wt 195.0 lb

## 2023-10-21 DIAGNOSIS — Z0000 Encounter for general adult medical examination without abnormal findings: Secondary | ICD-10-CM

## 2023-10-21 NOTE — Progress Notes (Signed)
 [facility] ____________________________________________   None    (approximate)  I have reviewed the triage vital signs and the nursing notes.   HISTORY  Chief Complaint No chief complaint on file.   HPI Thomas Mckay is a 38 y.o. male presents for annual physical exam.         Past Medical History:  Diagnosis Date   Arthritis    wrists   History of chlamydia    History of herpes genitalis    Non Hodgkin's lymphoma (HCC)    in remission. completed treatments 08/2012   Seizures (HCC)    x1. as infant    Patient Active Problem List   Diagnosis Date Noted   Knee pain, bilateral 04/28/2022   Anxiety due to invasive procedure 02/19/2022   Degeneration of lumbar intervertebral disc 02/19/2022   Stiffness of right knee 07/03/2021   Cervical spine pain 04/02/2021   Cervical radiculopathy 04/02/2021   Cervical spondylosis 04/02/2021   Strain of neck muscle 04/02/2021   Right knee pain 12/20/2020   Research study patient 12/06/2019   Abdominal pain 02/22/2019   Proteinuria 02/22/2019   Asymptomatic microscopic hematuria 02/22/2019   Pain in joint involving ankle and foot 02/01/2019   Achilles bursitis 02/01/2019   Achilles tendinitis 02/01/2019   Hip pain 02/01/2019   Lumbar sprain 02/01/2019   Sprain of deltoid ligament of ankle 02/01/2019   Sprain of hand 02/01/2019   Pre-bariatric surgery nutrition evaluation 06/03/2017   Gastroesophageal reflux disease 05/24/2017   Type 2 HSV infection of penis 05/24/2017   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   Shoulder joint pain 05/14/2017   Non-Hodgkin lymphoma (HCC) 04/14/2017   Bilateral carpal tunnel syndrome 01/28/2017   Tear of right glenoid labrum 10/09/2014   Diffuse large B-cell lymphoma of lymph nodes of neck (HCC) 08/02/2014   History of antineoplastic chemotherapy 11/22/2012   History of radiation therapy 11/22/2012   Allergic rhinitis 08/18/2007    Past Surgical  History:  Procedure Laterality Date   LYMPH NODE BIOPSY     SHOULDER ARTHROSCOPY WITH LABRAL REPAIR Right 06/14/2017   Procedure: SHOULDER ARTHROSCOPY WITH LABRAL REPAIR and subacromial debridement.;  Surgeon: Juanell Fairly, MD;  Location: [facility];  Service: Orthopedics;  Laterality: Right;   WISDOM TOOTH EXTRACTION      Prior to Admission medications   Medication Sig Start Date End Date Taking? Authorizing Provider  cyclobenzaprine (FLEXERIL) 5 MG tablet Take 1 tablet (5 mg total) by mouth 3 (three) times daily as needed for muscle spasms. 08/16/23   Joni Reining, PA-C  diclofenac (VOLTAREN) 50 MG EC tablet Take 50 mg by mouth as needed for moderate pain (from [facility]). Patient not taking: Reported on 11/30/2022    [provider]  fluticasone (FLONASE) 50 MCG/ACT nasal spray Place 2 sprays into both nostrils daily. Patient not taking: Reported on 08/23/2023 05/02/21   Joni Reining, PA-C  methylPREDNISolone (MEDROL DOSEPAK) 4 MG TBPK tablet Take as directed Patient not taking: Reported on 08/23/2023 08/16/23   Joni Reining, PA-C  omeprazole (PRILOSEC) 40 MG capsule Take 1 capsule (40 mg total) by mouth daily. 04/07/23   Joni Reining, PA-C  valACYclovir (VALTREX) 1000 MG tablet Take 1 tablet (1,000 mg total) by mouth 2 (two) times daily. 09/10/23   Joni Reining, PA-C  gabapentin (NEURONTIN) 300 MG capsule gabapentin 300 mg capsule  07/04/19  [provider]    Allergies Patient has no known allergies.  Family  History  Problem Relation Age of Onset   Asthma Mother    Hypertension Father    Asthma Brother     Social History Social History   Tobacco Use   Smoking status: Never   Smokeless tobacco: Never  Vaping Use   Vaping status: Never Used  Substance Use Topics   Alcohol use: Yes    Comment: rare - Holidays   Drug use: Never    Review of Systems Constitutional: No fever/chills Eyes: No visual changes. ENT: No sore  throat. Cardiovascular: Denies chest pain. Respiratory: Denies shortness of breath. Gastrointestinal: No abdominal pain.  No nausea, no vomiting.  No diarrhea.  No constipation. Genitourinary: Negative for dysuria. Musculoskeletal: Negative for back pain. Skin: Negative for rash. Neurological: Negative for headaches, focal weakness or numbness.  ____________________________________________   PHYSICAL EXAM:  VITAL SIGNS: BP 123/85  Cuff Size Large  Pulse Rate 74  Temp 98.4 F (36.9 C)  Temp Source Temporal  Weight 195 lb (88.5 kg)  Height 5\' 7"  (1.702 m)  Resp 16  SpO2 95 %   BMI: 30.54 kg/m2  BSA: 2.05 m2   Constitutional: Alert and oriented. Well appearing and in no acute distress. Eyes: Conjunctivae are normal. PERRL. EOMI. Head: Atraumatic. Nose: No congestion/rhinnorhea. Mouth/Throat: Mucous membranes are moist.  Oropharynx non-erythematous. Neck: No stridor.  No cervical spine tenderness to palpation. Hematological/Lymphatic/Immunilogical: No cervical lymphadenopathy. Cardiovascular: Normal rate, regular rhythm. Grossly normal heart sounds.  Good peripheral circulation. Respiratory: Normal respiratory effort.  No retractions. Lungs CTAB. Gastrointestinal: Soft and nontender. No distention. No abdominal bruits. No CVA tenderness. Genitourinary: Deferred Musculoskeletal: No lower extremity tenderness nor edema.  No joint effusions. Neurologic:  Normal speech and language. No gross focal neurologic deficits are appreciated. No gait instability. Skin:  Skin is warm, dry and intact. No rash noted. Psychiatric: Mood and affect are normal. Speech and behavior are normal.  ____________________________________________   LABS           Component Ref Range & Units (hover) 1 mo ago (09/20/23) 11 mo ago (11/02/22) 1 yr ago (12/30/21) 2 yr ago (01/15/21) 3 yr ago (12/01/19) 4 yr ago (08/06/19) 4 yr ago (07/04/19)  Color, UA Yellow Dark Yellow yellow Dark Yellow Dark Yellow     Clarity, UA Clear Clear clear Clear Clear    Glucose, UA Negative Negative Negative Negative Negative    Bilirubin, UA Negative Negative neg Negative Negative    Ketones, UA Negative Negative neg Negative Negative    Spec Grav, UA 1.015 1.020 1.015 1.025 1.020    Blood, UA Positive Positve CM neg Positive CM Positive CM    Comment: 1+  pH, UA 6.0 6.0 7.0 6.0 6.0    Protein, UA Negative Positive Abnormal  CM Negative Positive Abnormal  CM Negative    Urobilinogen, UA 0.2 0.2 0.2 0.2 0.2    Nitrite, UA Negative Negative neg Negative Negative    Leukocytes, UA Negative Negative Negative Negative Negative    Appearance      CLEAR R CLEAR R  Odor         Resulting Agency      [facility]             View All Conversations on this Encounter             Component Ref Range & Units (hover) 1 mo ago 11 mo ago 1 yr ago 2 yr ago 3 yr ago  Glucose 87 95 81  82 R 89 R  Uric Acid 5.9 7.3 CM 8.1 CM 6.8 CM 6.3 CM  Comment:            Therapeutic target for gout patients: <6.0  BUN 8 7 8 10 6   Creatinine, Ser 0.99 0.95 1.25 0.98 0.94  eGFR 101 107 77 104   BUN/Creatinine Ratio 8 Low  7 Low  6 Low  10 6 Low   Sodium 142 140 142 142 143  Potassium 4.1 4.1 4.5 4.2 4.3  Chloride 106 104 105 104 106  Calcium 9.1 8.9 8.9 9.0 8.7  Phosphorus 3.1 2.1 Low  2.7 Low  2.9 2.8  Total Protein 7.0 7.2 7.0 7.4 6.9  Albumin 4.5 4.3 4.2 R 4.6 R 4.2 R  Globulin, Total 2.5 2.9 2.8 2.8 2.7  Bilirubin Total 1.2 1.2 0.6 1.4 High  1.3 High   Alkaline Phosphatase 83 85 89 85 84 R, CM  LDH 210 207 250 High  193 176  AST 30 26 31 22 28   ALT 40 34 35 30 34  GGT 25 27 30 29 27   Iron 115 86 95 130 97  Cholesterol, Total 135 147 129 128 123  Triglycerides 89 137 61 82 77  HDL 35 Low  34 Low  34 Low  31 Low  33 Low   VLDL Cholesterol Cal 17 24 13 16 16   LDL Chol Calc (NIH) 83 89 82 81 74  Chol/HDL Ratio 3.9 4.3 CM 3.8 CM 4.1 CM 3.7 CM  Comment:                                   T. Chol/HDL  Ratio                                             Men  Women                               1/2 Avg.Risk  3.4    3.3                                   Avg.Risk  5.0    4.4                                2X Avg.Risk  9.6    7.1                                3X Avg.Risk 23.4   11.0  Estimated CHD Risk 0.7 0.8 CM 0.7 CM 0.8 CM 0.6 CM  Comment: The CHD Risk is based on the T. Chol/HDL ratio. Other factors affect CHD Risk such as hypertension, smoking, diabetes, severe obesity, and family history of premature CHD.  TSH 2.560 2.330 2.270 2.770 1.800  T4, Total 7.4 7.4 8.6 8.5 6.7  T3 Uptake Ratio 24 23 Low  26 22 Low  25  Free Thyroxine Index 1.8 1.7 2.2 1.9 1.7  WBC 4.5 4.3 3.6 3.9 4.0  RBC 5.36 5.39 4.74 5.53 5.22  Hemoglobin 15.4 15.6  13.1 15.7 15.0  Hematocrit 46.0 45.7 39.4 45.4 44.2  MCV 86 85 83 82 85  MCH 28.7 28.9 27.6 28.4 28.7  MCHC 33.5 34.1 33.2 34.6 33.9  RDW 12.2 11.9 12.1 11.7 12.1  Platelets 157 153 224 154 151  Neutrophils 53 48 51 38 46  Lymphs 38 40 38 47 40  Monocytes 4 6 7 7 8   Eos 4 4 3 7 4   Basos 1 1 1 1 1   Neutrophils Absolute 2.4 2.1 1.8 1.5 1.9  Lymphocytes Absolute 1.7 1.7 1.4 1.9 1.6  Monocytes Absolute 0.2 0.3 0.2 0.3 0.3  EOS (ABSOLUTE) 0.2 0.2 0.1 0.3 0.2  Basophils Absolute 0.0 0.0 0.0 0.0 0.0  Immature Granulocytes 0 1 0 0 1  Immature Grans (Abs) 0.0 0.0 0.0 0.0 0.0           ____________________________________________    ____________________________________________   INITIAL IMPRESSION / ASSESSMENT AND PLAN  As part of my medical decision making, I reviewed the following data within the electronic MEDICAL RECORD NUMBER       No acute findings on physical exam and labs.     ____________________________________________   FINAL CLINICAL IMPRESSION Well exam   ED Discharge Orders     None        Note:  This document was prepared using Dragon voice recognition software and may include unintentional dictation errors.

## 2023-10-21 NOTE — Progress Notes (Signed)
 Pt presents today to complete physical, Pt denies any issues or concerns at this time/CL,RMA

## 2023-11-23 ENCOUNTER — Other Ambulatory Visit: Payer: Self-pay | Admitting: Physician Assistant

## 2023-11-23 DIAGNOSIS — B009 Herpesviral infection, unspecified: Secondary | ICD-10-CM

## 2023-11-24 ENCOUNTER — Other Ambulatory Visit: Payer: Self-pay

## 2023-11-24 DIAGNOSIS — B009 Herpesviral infection, unspecified: Secondary | ICD-10-CM

## 2023-11-24 MED ORDER — VALACYCLOVIR HCL 1 G PO TABS: 1000.0000 mg | ORAL_TABLET | Freq: Two times a day (BID) | ORAL | 1 refills | Status: DC

## 2023-12-27 ENCOUNTER — Other Ambulatory Visit: Payer: Self-pay | Admitting: Medical Genetics

## 2024-01-03 ENCOUNTER — Ambulatory Visit: Payer: Self-pay | Admitting: Physician Assistant

## 2024-01-03 ENCOUNTER — Encounter: Payer: Self-pay | Admitting: Physician Assistant

## 2024-01-03 VITALS — BP 130/80 | HR 73 | Temp 98.4°F | Resp 14 | Wt 197.4 lb

## 2024-01-03 DIAGNOSIS — M79602 Pain in left arm: Secondary | ICD-10-CM

## 2024-01-03 MED ORDER — METHYLPREDNISOLONE 4 MG PO TBPK: ORAL_TABLET | ORAL | 0 refills | Status: DC

## 2024-01-03 NOTE — Progress Notes (Signed)
   Subjective: Pain left shoulder and numbness left hand    Patient ID: Thomas Mckay, male    DOB: 01/02/1986, 38 y.o.   MRN: [id number]  HPI Patient complaining of pain to the left shoulder which worsens with abduction movements.  Patient also complaining of numbness to the left hand.  Onset of complaint was 3 days ago while lifting a dog into the table.  Patient performed most of the lifting with his right upper extremity since this is his dominant side.  States pain 2/10 at rest and increased to a 6/10 with shoulder movement.  States conservative measures over the weekend.   Review of Systems Negative except for above complaint    Objective:   Physical Exam BP 130/80  Pulse Rate 73  Temp 98.4 F (36.9 C)  Weight 197 lb 6.4 oz (89.5 kg)  Resp 14  SpO2 93 %   BMI: 30.92 kg/m2  BSA: 2.06 m2   No obvious deformity of the left upper extremity.  No edema, edema, ecchymosis, or erythema to the right upper extremity.  Patient demonstrates hesitant range of motion of abduction of the left arm.  Examination of the left hand reveals no edema, erythema, ecchymosis.  Neurovascular intact with capillary refills less than 2 seconds.  Negative Tinel's sign.       Assessment & Plan: Left upper extremity pain  Discussed with patient that this could be because of inflammatory process.  Patient given prescription Medrol  Dosepak and advised return back in 1 week.

## 2024-01-03 NOTE — Progress Notes (Signed)
 Stated his left hand feels 'kind of numb' after reportedly handling a dog Friday with manually holding a dog at work and then lifting it and then placing it on a table.  Demonstrates full ROM with left hand finger, left hand, left forearm and when adduction of left arm in front of him reports no discomfort, but upon adduction of left arm out to side of body reports level 6 discomfort pointing to left shoulder area.  At rest reports discomfort about level 2.  Stated used RICE to left arm this weekend.  Reports intraction with dog Friday, 6/13, morning about 10am but at 4:25pm 6/13 called clinic and at present stated, I was trying to figure out what had caused my left hand to feel like this.  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

## 2024-01-10 ENCOUNTER — Encounter: Payer: Self-pay | Admitting: Physician Assistant

## 2024-01-10 ENCOUNTER — Ambulatory Visit: Payer: Self-pay | Admitting: Physician Assistant

## 2024-01-10 VITALS — BP 125/77 | HR 81 | Temp 98.2°F | Resp 14

## 2024-01-10 DIAGNOSIS — M79602 Pain in left arm: Secondary | ICD-10-CM

## 2024-01-10 MED ORDER — ORPHENADRINE CITRATE ER 100 MG PO TB12: 100.0000 mg | ORAL_TABLET | Freq: Two times a day (BID) | ORAL | 0 refills | Status: DC

## 2024-01-10 MED ORDER — NAPROXEN 500 MG PO TABS: 500.0000 mg | ORAL_TABLET | Freq: Two times a day (BID) | ORAL | 0 refills | Status: DC

## 2024-01-10 NOTE — Progress Notes (Signed)
 Follow up to meds and seen last week for reporting left hand sensation issues.  He reports slightly better with left hand and stating he's taking some voluntary comp time and resting.  Then he reports awkward feeling when lifting his left arm and reports hx R rotator cuff surgery and arthritis in his history.

## 2024-01-10 NOTE — Progress Notes (Signed)
   Subjective: Left arm pain    Patient ID: Thomas Mckay, male    DOB: 12-10-1985, 37 y.o.   MRN: [id number]  HPI Patient presents for follow-up of musculoskeletal pain to the left upper extremity.  Patient states numbness did radiate from the proximal to the distal left upper extremity.  Patient was given a brief course of some oral steroids and states sensation is returned.  States continues to have pain with abduction of the left upper extremity.  Review of Systems Bilateral carpal tunnel, cervical radiculopathy, and GERD.    Objective:   Physical Exam BP 125/77  Pulse Rate 81  Temp 98.2 F (36.8 C)  Resp 14  SpO2 98 %  No obvious deformity to the left lower extremity.  Neurovascular intact.  Hesitant but full range of motion of the left lower extremity.      Assessment & Plan: Muscle skeletal pain left upper extremity   Patient states that he will be using his comp time for the next 10 days.  Please rest and decreased range of motion we will complete his healing process.  Patient given prescription for Norflex and naproxen .  Follow-up as needed.

## 2024-01-25 ENCOUNTER — Encounter: Payer: Self-pay | Admitting: Physician Assistant

## 2024-01-25 ENCOUNTER — Telehealth: Payer: Self-pay

## 2024-01-25 ENCOUNTER — Ambulatory Visit: Payer: Self-pay | Admitting: Physician Assistant

## 2024-01-25 VITALS — BP 130/77 | HR 76 | Temp 98.0°F | Resp 14

## 2024-01-25 DIAGNOSIS — M79602 Pain in left arm: Secondary | ICD-10-CM

## 2024-01-25 NOTE — Progress Notes (Signed)
 Reports left shoulder discomfort reportedly to entire left arm, but not left hand he stated.  Reports at rest pain level 5 and stated higher with movement, but no level reported.  Stated he can sleep most nights and takes prescribed PRN meds as ordered for some relief.  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

## 2024-01-25 NOTE — Progress Notes (Signed)
   Subjective: Left arm pain    Patient ID: Thomas Mckay, male    DOB: July 21, 1985, 38 y.o.   MRN: [id number]  HPI Patient complaining of 4 weeks of left arm pain.  Initially this complaint was associated with numbness to the right hand.  Patient states mild relief with anti-inflammatory medications.  Patient states he noticed more moderate relief when he took time off from his job.  Patient job requires repetitive lifting but there is no specific provocative incident.  Patient is right hand dominant.  Voices concern for slow resolution of his complaint.   Review of Systems Allergic rhinitis and GERD.    Objective:   Physical Exam BP 130/77  Pulse Rate 76  Temp 98 F (36.7 C)  Resp 14  SpO2 96 %  Pain Score 5  Pain Loc Arm  Patient appears in no acute distress.  There is no obvious deformity to the left upper extremity.  Neurovascular intact.  Decreased range of motion with abduction limited by complaint of pain.  Strength against resistance is 4/5.  No pinpoint guarding with palpation of the left upper extremity.       Assessment & Plan: Left arm pain  No acute findings on examination.  Due to the extended timeframe for this complaint advised further evaluation by Ortho/PT/OT.

## 2024-01-25 NOTE — Telephone Encounter (Signed)
 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  He requests to be seen for further evaluation of this issue which he stated he feels needs to be evaluated again to resolve.  Set up to see our provider today to discuss in person.  He works at the [employer].

## 2024-02-03 DIAGNOSIS — Z9221 Personal history of antineoplastic chemotherapy: Secondary | ICD-10-CM | POA: Diagnosis not present

## 2024-02-03 DIAGNOSIS — C859A Non-Hodgkin lymphoma, unspecified, in remission: Secondary | ICD-10-CM | POA: Diagnosis not present

## 2024-02-03 DIAGNOSIS — C8331 Diffuse large B-cell lymphoma, lymph nodes of head, face, and neck: Secondary | ICD-10-CM | POA: Diagnosis not present

## 2024-02-03 DIAGNOSIS — C8511 Unspecified B-cell lymphoma, lymph nodes of head, face, and neck: Secondary | ICD-10-CM | POA: Diagnosis not present

## 2024-02-03 DIAGNOSIS — Z923 Personal history of irradiation: Secondary | ICD-10-CM | POA: Diagnosis not present

## 2024-02-07 DIAGNOSIS — M7582 Other shoulder lesions, left shoulder: Secondary | ICD-10-CM | POA: Diagnosis not present

## 2024-02-08 ENCOUNTER — Encounter: Payer: Self-pay | Admitting: Physical Therapy

## 2024-02-08 ENCOUNTER — Ambulatory Visit: Attending: Orthopedic Surgery | Admitting: Physical Therapy

## 2024-02-08 DIAGNOSIS — M6281 Muscle weakness (generalized): Secondary | ICD-10-CM | POA: Insufficient documentation

## 2024-02-08 DIAGNOSIS — M792 Neuralgia and neuritis, unspecified: Secondary | ICD-10-CM | POA: Diagnosis not present

## 2024-02-08 DIAGNOSIS — M25512 Pain in left shoulder: Secondary | ICD-10-CM | POA: Insufficient documentation

## 2024-02-08 NOTE — Therapy (Unsigned)
 OUTPATIENT PHYSICAL THERAPY EVALUATION   Patient Name: Thomas Mckay MRN: [id number] DOB:23-Jun-1986, 38 y.o., male Today's Date: 02/08/2024  END OF SESSION:  PT End of Session - 02/08/24 2001     Visit Number 1    Number of Visits 17    Date for PT Re-Evaluation 05/02/24    Authorization Type [employer]  reporting period from 02/08/2024    Authorization Time Period 01/18/24-02/15/25  VL:max 30 PT vsts per plan year    Authorization - Visit Number 1    Authorization - Number of Visits 30    Progress Note Due on Visit 10    PT Start Time 1304    PT Stop Time 1350    PT Time Calculation (min) 46 min    Activity Tolerance Patient tolerated treatment well    Behavior During Therapy WFL for tasks assessed/performed          Past Medical History:  Diagnosis Date   Arthritis    wrists   History of chlamydia    History of herpes genitalis    Non Hodgkin's lymphoma (HCC)    in remission. completed treatments 08/2012   Seizures (HCC)    x1. as infant   Past Surgical History:  Procedure Laterality Date   LYMPH NODE BIOPSY     SHOULDER ARTHROSCOPY WITH LABRAL REPAIR Right 06/14/2017   Procedure: SHOULDER ARTHROSCOPY WITH LABRAL REPAIR and subacromial debridement.;  Surgeon: Marchia Drivers, MD;  Location: [facility];  Service: Orthopedics;  Laterality: Right;   WISDOM TOOTH EXTRACTION     Patient Active Problem List   Diagnosis Date Noted   Knee pain, bilateral 04/28/2022   Anxiety due to invasive procedure 02/19/2022   Degeneration of lumbar intervertebral disc 02/19/2022   Stiffness of right knee 07/03/2021   Cervical spine pain 04/02/2021   Cervical radiculopathy 04/02/2021   Cervical spondylosis 04/02/2021   Strain of neck muscle 04/02/2021   Right knee pain 12/20/2020   Research study patient 12/06/2019   Abdominal pain 02/22/2019   Proteinuria 02/22/2019   Asymptomatic microscopic hematuria 02/22/2019   Pain in joint involving ankle and foot 02/01/2019    Achilles bursitis 02/01/2019   Achilles tendinitis 02/01/2019   Hip pain 02/01/2019   Lumbar sprain 02/01/2019   Sprain of deltoid ligament of ankle 02/01/2019   Sprain of hand 02/01/2019   Pre-bariatric surgery nutrition evaluation 06/03/2017   Gastroesophageal reflux disease 05/24/2017   Type 2 HSV infection of penis 05/24/2017   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   Shoulder joint pain 05/14/2017   Non-Hodgkin lymphoma (HCC) 04/14/2017   Bilateral carpal tunnel syndrome 01/28/2017   Tear of right glenoid labrum 10/09/2014   Diffuse large B-cell lymphoma of lymph nodes of neck (HCC) 08/02/2014   History of antineoplastic chemotherapy 11/22/2012   History of radiation therapy 11/22/2012   Allergic rhinitis 08/18/2007    PCP: Claudene Tanda POUR, PA-C  REFERRING PROVIDER: Kathlynn Sharper, MD  REFERRING DIAG: Left rotator cuff tendinitis  THERAPY DIAG:  Acute pain of left shoulder  Neuralgia and neuritis  Muscle weakness (generalized)  Rationale for Evaluation and Treatment: Rehabilitation  ONSET DATE: June 2025  SUBJECTIVE:  SUBJECTIVE STATEMENT: Patient states he thinks his injury was in June. He was at work and was trying to restrain a dog. He picked it up to put it on a table while trying to vaccinate it and it was kicking his arm. Not too long his symptoms started. He had achiness and numbness that traveled down to his left hand. The numbness went away after a couple of days. He states since that time, the longer he kept the dog in a head lock he feels he uses strength in his left bicep region.  He tried to report it as a worker's comp issue but his workplace said it was not because it is an overuse injury. He does not recall feeling a pop or anything.   He was off work for a week when this happened  because he had time that he needed to take so he was forced to be off for a week. He thinks he would probably have missed work if he had to work that week.   He feels like it has gotten a little bit better. When it first happened he thought he might have torn it like his R shoulder. It is feeling a little better now, so he is hoping he did not tear it.   His PCP recommended ibuprofen  (not very helpful). He took prednisone  (helped at first then came back afterwards).   He was treated for cancer 2013 and it is in remission. He sees oncologist 1x a year.    Hand dominance: Right  PERTINENT HISTORY: Patient is a 38 y.o. male who presents to outpatient physical therapy with a referral for medical diagnosis left rotator cuff tendinitis. This patient's chief complaints consist of left shoulder pain and arm weakness with intermittent paresthesia to the hand, leading to the following functional deficits: difficulty with all activities that require use of L UE including reaching, carrying, lifting, restraining and holding animals, working, sleeping, housework, peparing food, grooming, pushing, driving, yardwork, dressing, using tools or appliances, laundry, throwing a ball, playing basket ball, going to the gym. Relevant past medical history and comorbidities include the following: he has Pain in joint involving ankle and foot; Achilles bursitis; Achilles tendinitis; Allergic rhinitis; Bilateral carpal tunnel syndrome; Boutonniere deformity; Diffuse large B-cell lymphoma of lymph nodes of neck (HCC); Gastroesophageal reflux disease; Hip pain; History of antineoplastic chemotherapy; Lumbar sprain; Non-Hodgkin lymphoma (HCC); Sprain of deltoid ligament of ankle; Sprain of hand; Tear of right glenoid labrum; Type 2 HSV infection of penis; Shoulder joint pain; History of radiation therapy; Abdominal pain; Proteinuria; Asymptomatic microscopic hematuria; Pre-bariatric surgery nutrition evaluation; Research study  patient; Right knee pain; Cervical spine pain; Cervical radiculopathy; Cervical spondylosis; Strain of neck muscle; Stiffness of right knee; Knee pain, bilateral;  and Degeneration of lumbar intervertebral disc on their problem list. he  has a past medical history of Arthritis, History of chlamydia, History of herpes genitalis, Non Hodgkin's lymphoma (HCC), and Seizures (as a baby). he  has a past surgical history that includes Wisdom tooth extraction; Lymph node biopsy; and Shoulder arthroscopy with labral repair (Right, 06/14/2017). Patient denies hx of stroke, lung problems, heart problems, diabetes, unexplained weight loss, unexplained changes in bowel or bladder problems, unexplained stumbling or dropping things, osteoporosis, and spinal surgery, or left shoulder surgery.   Exercise history: likes to go to the gym and play basketball   PAIN: Are you having pain? Yes NPRS: Current: 5-6/10,  Best: 2-3/10, Worst: 8/10. Pain location: inside left shoulder, worst at anterior shoulder  near pec, numbness over his whole left hand (currently there because he was poking the front of his shoulder).  Pain description: dull, Aggravating factors: the more he uses his left shoulder for anything weight wise including holding up kittens that are 2-3 lbs, reaching, sleeping on his left side (does not wake him), housework Relieving factors: as soon as he wakes up, hot bath  FUNCTIONAL LIMITATIONS: difficulty with all activities that require use of L UE including reaching, carrying, lifting, restraining and holding animals, working, sleeping, housework, preparing food, grooming, pushing, driving, yardwork, dressing, using tools or appliances, laundry, throwing a ball, playing basket ball, going to the gym.   LEISURE: going to the gym, play basketball  PRECAUTIONS: Other: don't lift anything over 10 lbs with left UE  WEIGHT BEARING RESTRICTIONS: No  FALLS:  Has patient fallen in last 6 months?  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   PLOF: Independent, no problems or limitations from left arm.   PATIENT GOALS: back to normal hoping it ain't going to tear  NEXT MD VISIT: 5 weeks from last visit with Dr. Kathlynn (yesterday)  OBJECTIVE  DIAGNOSTIC FINDINGS:  L shoulder xray report from 02/07/2024 X-ray left shoulder: AC arthritis with spurring at the inferior acromion,  early degenerative changes to the glenohumeral joint, subacromial space  maintained. Impression: AC arthritis, possible early glenohumeral  arthritis (02/07/2024).   SELF-REPORTED FUNCTION Upper Extremity Functional Scale (UEFS): 34 (range 0-80)  OBSERVATION/INSPECTION Posture Posture (seated): forward head, rounded shoulders, slumped in sitting.  Anthropometrics Tremor: none Body composition: WFL Muscle bulk: WNL Functional Mobility Bed mobility: supine <> sit WNL Transfers: sit <> stand WNL Gait: grossly WFL for household and short community ambulation. More detailed gait analysis deferred to later date as needed.   NEUROLOGICAL Deep Tendon Reflexes R/L  B equal and intact +2 at biceps, triceps, wrist extensors, pronator, finger flexors See myotomes with MMT below   SPINE MOTION CERVICAL SPINE AROM *Indicates pain Flexion: full no pain Extension: restricted, painful at base of neck  Side Flexion:   R stretch at left UT  L tightness at L UT Rotation:  R normal L tightness at left UT  PERIPHERAL JOINT MOTION (in degrees) ACTIVE RANGE OF MOTION (AROM) B shoulders equal and full except missing L abduction due to pain from about 140 on, painful with OP.  Improved L abduction pain and ROM with cervical distraction.    MUSCLE PERFORMANCE/MYOTOMES (MMT): *Indicates pain 02/08/24 Date Date  Joint/Motion R/L R/L R/L  Shoulder     Flexion / /  /  Abduction (C5) 5/4* / /  External rotation (C5) 5/4* / /  Internal rotation / / /  Extension / / /  Periscapular     Upper Trap / / /  Middle Trap / / /  Lower Trap / / /  Elbow     Flexion (C6) 5/4* / /  Extension (C7) 4/4+* / /  Wrist     Flexion (C7) /    Extension (C6) 4/4 / /  Hand     MCP extension (C8) 4/4 / /  Pinky finger abduction (T1) 4/4 / /  Finger adduction (C8) / / /  Comments:  02/08/24: improved strength where tested (Shoulder ER) when placed supine and with cervical spine distraction.  TREATMENT Education on load sensitivity at the cervical spine, keeping work in front of him and elbows near sides to help allow sensitivity to calm down. Ergonomics and how to decrease load on the cervical spine and L shoulder to allow healing.   PATIENT EDUCATION: Education details: Dentist. Education on diagnosis, prognosis, POC, anatomy and physiology of current condition.  Person educated: Patient Education method: Explanation Education comprehension: verbalized understanding and needs further education  HOME EXERCISE PROGRAM: TBD  ASSESSMENT:  CLINICAL IMPRESSION: Patient is a 38 y.o. male referred to outpatient physical therapy with a medical diagnosis of left rotator cuff tendinitis who presents with signs and symptoms consistent with left shoulder pain and L UE weakness and paresthesia following an injury in June. He appears to have myotomal weakness at C5 and C6 that improves with cervical unloading thorough distraction, suggesting a cervical spine contribution to his condition. This would match with the has left sided upper trap discomfort he experiences with left cervical spine rotation, sidebending, and extension and the intermittent paresthesia to his hand. However, his DTRs appear equal and intact bilaterally. C5 myotomes  tested today also correspond to the same motions  most commonly affected by a RTC condition (shoulder ER and abduction). Plan to address RTC and shoulder as well as any cervical spine contributions. Patient has good motion B shoulders except some limited overhead movement in the L. Patient presents with significant pain, ROM, joint stiffness, muscle performance (strength/power/endurance), paresthesia, and activity tolerance impairments that are limiting ability to complete all activities that require use of L UE including reaching, carrying, lifting, restraining and holding animals, working, sleeping, housework, peparing food, grooming, pushing, driving, yardwork, dressing, using tools or appliances, laundry, throwing a ball, playing basket ball, going to the gym without difficulty. Patient will benefit from skilled physical therapy intervention to address current body structure impairments and activity limitations to improve function and work towards goals set in current POC in order to return to prior level of function or maximal functional improvement.    OBJECTIVE IMPAIRMENTS: decreased activity tolerance, decreased endurance, decreased knowledge of condition, decreased ROM, decreased strength, impaired UE functional use, improper body mechanics, postural dysfunction, and pain.   ACTIVITY LIMITATIONS: carrying, lifting, sleeping, bathing, dressing, reach over head, hygiene/grooming, and caring for others  PARTICIPATION LIMITATIONS: meal prep, cleaning, laundry, interpersonal relationship, driving, shopping, community activity, occupation, and yard work  PERSONAL FACTORS: Profession and 3+ comorbidities:   Allergic rhinitis; Bilateral carpal tunnel syndrome; Boutonniere deformity; Diffuse large B-cell lymphoma of lymph nodes of neck (HCC); Gastroesophageal reflux disease; Hip pain; History of antineoplastic chemotherapy; Lumbar sprain; Non-Hodgkin lymphoma (HCC);  Tear of right glenoid labrum; Shoulder  joint pain; History of radiation therapy;  Cervical spine pain; Cervical radiculopathy; Cervical spondylosis;  and Degeneration of lumbar intervertebral disc on their problem list. he  has a past medical history of Arthritis,  Non Hodgkin's lymphoma (HCC), and Seizures (as a baby). he  has a past surgical history that includes Wisdom tooth extraction; Lymph node biopsy; and Shoulder arthroscopy with labral repair (Right, 06/14/2017) are also affecting patient's functional outcome.   REHAB POTENTIAL: Good  CLINICAL DECISION MAKING: Evolving/moderate complexity  EVALUATION COMPLEXITY: Moderate   GOALS: Goals reviewed with patient? No  SHORT TERM GOALS: Target date: 02/22/2024  Patient will be independent with initial home exercise program for self-management of symptoms. Baseline: Initial HEP to be provided at visit 2 as appropriate (02/08/24); Goal status: INITIAL   LONG TERM GOALS: Target date: 05/02/2024  Patient  will be independent with a long-term home exercise program for self-management of symptoms.  Baseline: Initial HEP to be provided at visit 2 as appropriate (02/08/24); Goal status: INITIAL  2.  Patient will demonstrate improved Upper Extremity Functional Scale (UEFS) to equal or greater than 64/80 to demonstrate improvement in overall condition and self-reported functional ability.  Baseline: 34/80 (02/08/24); Goal status: INITIAL  3.  Patient will demonstrate pain free L UE strength equal or greater to R UE strength to improve his ability to complete his job duties.  Baseline: decreased and painful (02/08/24); Goal status: INITIAL  4.  Patient will demonstrate L shoulder AROM/PROM pain free and equal or greater to R shoulder to improve his ability to reach overhead and move into awkward positions while performing his job duties and leisure activities such as basketball and going to the gym.  Baseline: limited in overhead movement and painful (02/08/24); Goal status:  INITIAL  5.  Patient will demonstrate improvement in Patient Specific Functional Scale (PSFS) of equal or greater than 8/10 points to reflect clinically significant improvement in patient's most valued functional activities. Baseline: to be measured at visit 2 as appropriate (02/08/24); Goal status: INITIAL  6.  Patient will report NPRS equal or less than 2/10 during functional activities during the last 2 weeks to improve their abilitly to complete community, work and/or recreational activities with less limitation. Baseline: 8/10 (02/08/24); Goal status: INITIAL   PLAN:  PT FREQUENCY: 1-2x/week  PT DURATION: 12 weeks  PLANNED INTERVENTIONS: 02835- PT Re-evaluation, 97750- Physical Performance Testing, 97110-Therapeutic exercises, 97530- Therapeutic activity, [id number]- Neuromuscular re-education, 97535- Self Care, 02859- Manual therapy, [id number]- Electrical stimulation (unattended), [phone]- Traction (mechanical), 20560 (1-2 muscles), 20561 (3+ muscles)- Dry Needling, Patient/Family education, Joint mobilization, Spinal mobilization, Cryotherapy, and Moist heat  PLAN FOR NEXT SESSION: update HEP as appropriate, continue exam to L shoulder, activity modifications to accommodate mechanical stressors and allow healing, then motor control exercises to teach improved mechanics, progressive loading of the shoulder girdle, and reconditioning of the movements and tasks that previously irritated condition. Manual therapy as needed to restore motion and provide sympotm modulation.    Camie SAUNDERS. Juli, PT, DPT, Cert. MDT 02/08/24, 8:12 PM  [facility] [address] [place], [place] [address] P: [phone] I F: [phone]

## 2024-02-10 ENCOUNTER — Ambulatory Visit: Admitting: Physical Therapy

## 2024-02-10 ENCOUNTER — Encounter: Payer: Self-pay | Admitting: Physical Therapy

## 2024-02-10 VITALS — BP 118/85 | HR 76

## 2024-02-10 DIAGNOSIS — M6281 Muscle weakness (generalized): Secondary | ICD-10-CM

## 2024-02-10 DIAGNOSIS — M25512 Pain in left shoulder: Secondary | ICD-10-CM | POA: Diagnosis not present

## 2024-02-10 DIAGNOSIS — M792 Neuralgia and neuritis, unspecified: Secondary | ICD-10-CM
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
## 2024-02-24 ENCOUNTER — Ambulatory Visit: Attending: Orthopedic Surgery | Admitting: Physical Therapy

## 2024-02-24 ENCOUNTER — Encounter: Payer: Self-pay | Admitting: Physical Therapy

## 2024-02-24 DIAGNOSIS — M6281 Muscle weakness (generalized): Secondary | ICD-10-CM | POA: Insufficient documentation

## 2024-02-24 DIAGNOSIS — M25512 Pain in left shoulder: Secondary | ICD-10-CM | POA: Insufficient documentation

## 2024-02-24 DIAGNOSIS — M792 Neuralgia and neuritis, unspecified: Secondary | ICD-10-CM | POA: Diagnosis not present

## 2024-02-24 NOTE — Therapy (Signed)
 OUTPATIENT PHYSICAL THERAPY TREATEMENT   Patient Name: Thomas Mckay MRN: [id number] DOB:July 23, 1985, 38 y.o., male Today's Date: 02/24/2024  END OF SESSION:  PT End of Session - 02/24/24 1831     Visit Number 3    Number of Visits 17    Date for PT Re-Evaluation 05/02/24    Authorization Type [employer]  reporting period from 02/08/2024    Authorization Time Period 01/18/24-02/15/25  VL:max 30 PT vsts per plan year    Authorization - Visit Number 3    Authorization - Number of Visits 30    Progress Note Due on Visit 10    PT Start Time 1039    PT Stop Time 1120    PT Time Calculation (min) 41 min    Activity Tolerance Patient tolerated treatment well    Behavior During Therapy WFL for tasks assessed/performed            Past Medical History:  Diagnosis Date   Arthritis    wrists   History of chlamydia    History of herpes genitalis    Non Hodgkin's lymphoma (HCC)    in remission. completed treatments 08/2012   Seizures (HCC)    x1. as infant   Past Surgical History:  Procedure Laterality Date   LYMPH NODE BIOPSY     SHOULDER ARTHROSCOPY WITH LABRAL REPAIR Right 06/14/2017   Procedure: SHOULDER ARTHROSCOPY WITH LABRAL REPAIR and subacromial debridement.;  Surgeon: Marchia Drivers, MD;  Location: [facility];  Service: Orthopedics;  Laterality: Right;   WISDOM TOOTH EXTRACTION     Patient Active Problem List   Diagnosis Date Noted   Knee pain, bilateral 04/28/2022   Anxiety due to invasive procedure 02/19/2022   Degeneration of lumbar intervertebral disc 02/19/2022   Stiffness of right knee 07/03/2021   Cervical spine pain 04/02/2021   Cervical radiculopathy 04/02/2021   Cervical spondylosis 04/02/2021   Strain of neck muscle 04/02/2021   Right knee pain 12/20/2020   Research study patient 12/06/2019   Abdominal pain 02/22/2019   Proteinuria 02/22/2019   Asymptomatic microscopic hematuria 02/22/2019   Pain in joint involving ankle and foot 02/01/2019    Achilles bursitis 02/01/2019   Achilles tendinitis 02/01/2019   Hip pain 02/01/2019   Lumbar sprain 02/01/2019   Sprain of deltoid ligament of ankle 02/01/2019   Sprain of hand 02/01/2019   Pre-bariatric surgery nutrition evaluation 06/03/2017   Gastroesophageal reflux disease 05/24/2017   Type 2 HSV infection of penis 05/24/2017   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   Shoulder joint pain 05/14/2017   Non-Hodgkin lymphoma (HCC) 04/14/2017   Bilateral carpal tunnel syndrome 01/28/2017   Tear of right glenoid labrum 10/09/2014   Diffuse large B-cell lymphoma of lymph nodes of neck (HCC) 08/02/2014   History of antineoplastic chemotherapy 11/22/2012   History of radiation therapy 11/22/2012   Allergic rhinitis 08/18/2007    PCP: Claudene Tanda POUR, PA-C  REFERRING PROVIDER: Kathlynn Sharper, MD  REFERRING DIAG: Left rotator cuff tendinitis  THERAPY DIAG:  Acute pain of left shoulder  Neuralgia and neuritis  Muscle weakness (generalized)  Rationale for Evaluation and Treatment: Rehabilitation  ONSET DATE: June 2025  SUBJECTIVE:  PERTINENT HISTORY: Patient is a 38 y.o. male who presents to outpatient physical therapy with a referral for medical diagnosis left rotator cuff tendinitis. This patient's chief complaints consist of left shoulder pain and arm weakness with intermittent paresthesia to the hand, leading to the following functional deficits: difficulty with all activities that require use of L UE including reaching, carrying, lifting, restraining and holding animals, working, sleeping, housework, peparing food, grooming, pushing, driving, yardwork, dressing, using tools or appliances, laundry, throwing a ball, playing basket ball, going to the gym. Relevant past medical history and comorbidities include  the following: he has Pain in joint involving ankle and foot; Achilles bursitis; Achilles tendinitis; Allergic rhinitis; Bilateral carpal tunnel syndrome; Boutonniere deformity; Diffuse large B-cell lymphoma of lymph nodes of neck (HCC); Gastroesophageal reflux disease; Hip pain; History of antineoplastic chemotherapy; Lumbar sprain; Non-Hodgkin lymphoma (HCC); Sprain of deltoid ligament of ankle; Sprain of hand; Tear of right glenoid labrum; Type 2 HSV infection of penis; Shoulder joint pain; History of radiation therapy; Abdominal pain; Proteinuria; Asymptomatic microscopic hematuria; Pre-bariatric surgery nutrition evaluation; Research study patient; Right knee pain; Cervical spine pain; Cervical radiculopathy; Cervical spondylosis; Strain of neck muscle; Stiffness of right knee; Knee pain, bilateral;  and Degeneration of lumbar intervertebral disc on their problem list. he  has a past medical history of Arthritis, History of chlamydia, History of herpes genitalis, Non Hodgkin's lymphoma (HCC), and Seizures (as a baby). he  has a past surgical history that includes Wisdom tooth extraction; Lymph node biopsy; and Shoulder arthroscopy with labral repair (Right, 06/14/2017). Patient denies hx of stroke, lung problems, heart problems, diabetes, unexplained weight loss, unexplained changes in bowel or bladder problems, unexplained stumbling or dropping things, osteoporosis, and spinal surgery, or left shoulder surgery.   Exercise history: likes to go to the gym and play basketball. Might have a 3-5#DB. Also has curling bench press thing. He still has a Photographer.   SUBJECTIVE STATEMENT: Patient states his L shoulder is doing a little better. He has more range of motion and he can sleep on it better. Work is going okay. Still giving him trouble picking up kittens. He has been doing his HEP a little bit. He has not had numbness in his hand since about a week after it happened. He has been doing HEP about half  the time. Having some problems with the one with weight because the weight he has is 5#.   PAIN: NPRS: Current: 3/10 anterior L shoulder  Best: 2-3/10, Worst: 8/10. Pain location: inside left shoulder, worst at anterior shoulder near pec, numbness over his whole left hand (currently there because he was poking the front of his shoulder).  Pain description: dull, Aggravating factors: the more he uses his left shoulder for anything weight wise including holding up kittens that are 2-3 lbs, reaching, sleeping on his left side (does not wake him), housework Relieving factors: as soon as he wakes up, hot bath  FUNCTIONAL LIMITATIONS: difficulty with all activities that require use of L UE including reaching, carrying, lifting, restraining and holding animals, working, sleeping, housework, preparing food, grooming, pushing, driving, yardwork, dressing, using tools or appliances, laundry, throwing a ball, playing basket ball, going to the gym.   LEISURE: going to the gym, play basketball  PRECAUTIONS: Other: don't lift anything over 10 lbs with left UE  WEIGHT BEARING RESTRICTIONS: Mckay  FALLS:  Has patient fallen in last 6 months? 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   PLOF: Independent, Mckay problems or limitations from left arm.   PATIENT GOALS: back to normal hoping it ain't going to tear  NEXT MD VISIT: 5 weeks from last visit with Dr. Kathlynn (yesterday)  OBJECTIVE  DTR Biceps: B 2+ Triceps: B 2+ Wrist extensors: R 1+, L 2+ Pronator: R 0+, L 1+ Finger flexors:  R: only fired a couple of times, L 2+  UPPER LIMB NEURODYNAMIC TESTS (last tested 02/10/24);  Upper Limb Tension Test 1 (ULTT1, Median nerve bias, Magee-ULTT1):  R = end range tension, Mckay change with head L  = end range tension, sensitive to head position.  Upper Limb  Tension Test 2B (ULTT2B, Radial nerve bias, Magee-ULTT3):  R  = tension as arm moves from side, change with head L = tension including anterior shoulder pain with minimal movement from side, sensitive to head position. (Mildly more restricted than R).  Upper Limb Tension Test 3 (ULTT3, Ulnar nerve bias, Magee-ULTT4):  R = end range tension, change with head  L = end range tension, change with head (slightly more restricted than R)                                                                                                                            TREATMENT  Neuromuscular Re-education: a technique or exercise performed with the goal of improving the level of communication between the body and the brain, such as for balance, motor control, muscle activation patterns, coordination, desensitization, quality of muscle contraction, proprioception, and/or kinesthetic sense needed for successful and safe completion of functional activities.  DTR testing to assess nerve function (see above)  Prone scapular retraction and posterior tilt from UE hanging off edge of plinth in 90 degrees flexion focusing on motor control and lower trap activation without UT activation.  Many reps on the left with max cuing to help him find movement and improving self awareness.  1x20 L Reports numbness and tingling by end of practice, so discontinued.  1x10 on R with similar difficulty performing and now paresthesia in R UE (so discontinued).   Prone B shoulder ER with hands overhead to improve intrascapular activation and decrease UT dominance 1x20 with 5 second holds Improved activation pattern after learning exercise with significant cuing  Sidelying L shoulder ER with scapular retraction with towel roll under arm and top knee on table to prevent compensatory roll. To improve intrascapular strength.  Completed reps AROM, then attempted to add weight Identified that pain came from end range scapular  retraction Then attempted to complete L shoulder ER without scapular retraction but continued to be too uncomfortable, so discontinued.   Seated lat pull down against SilverTB to improve lower trap activation and decrease UT activation 1x20 in chair and in half kneeling Well tolerated Added to HEP  Pt required multimodal cuing for  proper technique and to facilitate improved neuromuscular control, strength, range of motion, and functional ability resulting in improved performance and form.    PATIENT EDUCATION: Education details: Exercise purpose/form. Self management techniques. Education on diagnosis, prognosis, POC, anatomy and physiology of current condition. Education on HEP including handout. Person educated: Patient Education method: Explanation Education comprehension: verbalized understanding and needs further education  HOME EXERCISE PROGRAM: Access Code: [email or web address] URL: [email or web address] Date: 02/24/2024 Prepared by: Camie Cleverly  Program Notes  [email or web address]   Exercises - Lat Pull with band/cable  - 1 x daily - 2 sets - 10 reps - 5 seconds hold  HOME EXERCISE PROGRAM [[id number]]  Prone B shoulder ER for lower traps -  Repeat 20 Repetitions, Hold 5 Seconds, Complete 2 Sets, Perform 1 Times a Day  ASSESSMENT:  CLINICAL IMPRESSION: Patient with poor recall of HEP and required step by step demonstration to remember exercises. He was unable to perform prone scapular retraction with arm hanging off the edge of the table due to lack of ability to avoid compensation motor patterns. He started to be able to tell when he was performing it incorrectly but had difficulty performing it with correct form. He then had UE paresthesia with this exercise, so it was discontinued for now. He also had pain with ER exercise from HEP, so it was discontinued for now. He had Mckay increased pain with lat pull down or prone B ER exercise,  so this was added to HEP after he learned how to perform it. Pateint demonstrates extremely quick UT activation and overcompensation here for all UT movements. This is likely contributory to his posture and shoulder pain. Patient would benefit from continued management of limiting condition by skilled physical therapist to address remaining impairments and functional limitations to work towards stated goals and return to PLOF or maximal functional independence.   From initial PT evaluation  on 02/08/24:  Patient is a 38 y.o. male referred to outpatient physical therapy with a medical diagnosis of left rotator cuff tendinitis who presents with signs and symptoms consistent with left shoulder pain and L UE weakness and paresthesia following an injury in June. He appears to have myotomal weakness at C5 and C6 that improves with cervical unloading thorough distraction, suggesting a cervical spine contribution to his condition. This would match with the has left sided upper trap discomfort he experiences with left cervical spine rotation, sidebending, and extension and the intermittent paresthesia to his hand. However, his DTRs appear equal and intact bilaterally. C5 myotomes tested today also correspond to the same motions  most commonly affected by a RTC condition (shoulder ER and abduction). Plan to address RTC and shoulder as well as any cervical spine contributions. Patient has good motion B shoulders except some limited overhead movement in the L. Patient presents with significant pain, ROM, joint stiffness, muscle performance (strength/power/endurance), paresthesia, and activity tolerance impairments that are limiting ability to complete all activities that require use of L UE including reaching, carrying, lifting, restraining and holding animals, working, sleeping, housework, peparing food, grooming, pushing, driving, yardwork, dressing, using tools or appliances, laundry, throwing a ball, playing basket ball,  going to the gym without difficulty. Patient will benefit from skilled physical therapy intervention to address current body structure impairments and activity limitations to improve function and work towards goals set in current POC in order to return to prior level of function or maximal functional improvement.    OBJECTIVE IMPAIRMENTS: decreased activity tolerance, decreased endurance, decreased knowledge of condition, decreased  ROM, decreased strength, impaired UE functional use, improper body mechanics, postural dysfunction, and pain.   ACTIVITY LIMITATIONS: carrying, lifting, sleeping, bathing, dressing, reach over head, hygiene/grooming, and caring for others  PARTICIPATION LIMITATIONS: meal prep, cleaning, laundry, interpersonal relationship, driving, shopping, community activity, occupation, and yard work  PERSONAL FACTORS: Profession and 3+ comorbidities:   Allergic rhinitis; Bilateral carpal tunnel syndrome; Boutonniere deformity; Diffuse large B-cell lymphoma of lymph nodes of neck (HCC); Gastroesophageal reflux disease; Hip pain; History of antineoplastic chemotherapy; Lumbar sprain; Non-Hodgkin lymphoma (HCC);  Tear of right glenoid labrum; Shoulder joint pain; History of radiation therapy;  Cervical spine pain; Cervical radiculopathy; Cervical spondylosis;  and Degeneration of lumbar intervertebral disc on their problem list. he  has a past medical history of Arthritis,  Non Hodgkin's lymphoma (HCC), and Seizures (as a baby). he  has a past surgical history that includes Wisdom tooth extraction; Lymph node biopsy; and Shoulder arthroscopy with labral repair (Right, 06/14/2017) are also affecting patient's functional outcome.   REHAB POTENTIAL: Good  CLINICAL DECISION MAKING: Evolving/moderate complexity  EVALUATION COMPLEXITY: Moderate   GOALS: Goals reviewed with patient? Mckay  SHORT TERM GOALS: Target date: 02/22/2024  Patient will be independent with initial home exercise program  for self-management of symptoms. Baseline: Initial HEP to be provided at visit 2 as appropriate (02/08/24); Goal status: In progress   LONG TERM GOALS: Target date: 05/02/2024  Patient will be independent with a long-term home exercise program for self-management of symptoms.  Baseline: Initial HEP to be provided at visit 2 as appropriate (02/08/24); Goal status: In progress  2.  Patient will demonstrate improved Upper Extremity Functional Scale (UEFS) to equal or greater than 64/80 to demonstrate improvement in overall condition and self-reported functional ability.  Baseline: 34/80 (02/08/24); Goal status: In progress  3.  Patient will demonstrate pain free L UE strength equal or greater to R UE strength to improve his ability to complete his job duties.  Baseline: decreased and painful (02/08/24); Goal status: In progress  4.  Patient will demonstrate L shoulder AROM/PROM pain free and equal or greater to R shoulder to improve his ability to reach overhead and move into awkward positions while performing his job duties and leisure activities such as basketball and going to the gym.  Baseline: limited in overhead movement and painful (02/08/24); Goal status: In progress  5.  Patient will demonstrate improvement in Patient Specific Functional Scale (PSFS) of equal or greater than 8/10 points to reflect clinically significant improvement in patient's most valued functional activities. Baseline: to be measured at visit 2 as appropriate (02/08/24); 1/10 (02/10/24);  Goal status: In progress  6.  Patient will report NPRS equal or less than 2/10 during functional activities during the last 2 weeks to improve their abilitly to complete community, work and/or recreational activities with less limitation. Baseline: 8/10 (02/08/24); Goal status: In progress   PLAN:  PT FREQUENCY: 1-2x/week  PT DURATION: 12 weeks  PLANNED INTERVENTIONS: 97164- PT Re-evaluation, 97750- Physical Performance  Testing, 97110-Therapeutic exercises, 97530- Therapeutic activity, [id number]- Neuromuscular re-education, 97535- Self Care, [id number]- Manual therapy, [id number]- Electrical stimulation (unattended), [phone]- Traction (mechanical), 20560 (1-2 muscles), 20561 (3+ muscles)- Dry Needling, Patient/Family education, Joint mobilization, Spinal mobilization, Cryotherapy, and Moist heat  PLAN FOR NEXT SESSION: update HEP as appropriate, activity modifications to accommodate mechanical stressors and allow healing, then motor control exercises to teach improved mechanics, progressive loading of the shoulder girdle, and reconditioning of the movements and tasks that previously irritated condition. Manual therapy as needed  to restore motion and provide sympotm modulation.    Camie SAUNDERS. Juli, PT, DPT, Cert. MDT 02/24/24, 6:42 PM  [facility] [address] [place], [place] [address] P: [phone] I F: [phone]

## 2024-02-26 ENCOUNTER — Other Ambulatory Visit: Payer: Self-pay | Admitting: Physician Assistant

## 2024-02-26 DIAGNOSIS — B009 Herpesviral infection, unspecified: Secondary | ICD-10-CM

## 2024-02-29 ENCOUNTER — Ambulatory Visit: Admitting: Physical Therapy

## 2024-02-29 ENCOUNTER — Telehealth: Payer: Self-pay | Admitting: Physical Therapy

## 2024-02-29 NOTE — Telephone Encounter (Signed)
 LVM notifying patient of missed PT visit scheduled at 5:30pm today. Requested they call back to reschedule, confirm next appointment, or let us  know of any changes in PT plans. Let patient know that with any no-show I am required to review our cancellation policy that after 2 no-shows we remove future visits from the schedule, they are responsible to calling in to reschedule, and they will only be able to schedule one appointment at a time and/or we may remove a patient from the schedule.   Camie SAUNDERS. Juli, PT, DPT 02/29/24, 5:41 PM  [facility] [address] [place], [place] [address] P: [phone] I F: [phone]

## 2024-03-06 ENCOUNTER — Ambulatory Visit: Admitting: Physical Therapy

## 2024-03-07 ENCOUNTER — Encounter: Payer: Self-pay | Admitting: Physical Therapy

## 2024-03-07 ENCOUNTER — Ambulatory Visit: Admitting: Physical Therapy

## 2024-03-07 DIAGNOSIS — M792 Neuralgia and neuritis, unspecified: Secondary | ICD-10-CM

## 2024-03-07 DIAGNOSIS — M6281 Muscle weakness (generalized): Secondary | ICD-10-CM | POA: Diagnosis not present

## 2024-03-07 DIAGNOSIS — M25512 Pain in left shoulder: Secondary | ICD-10-CM | POA: Diagnosis not present

## 2024-03-07 NOTE — Therapy (Signed)
 OUTPATIENT PHYSICAL THERAPY TREATEMENT   Patient Name: Thomas Mckay MRN: [id number] DOB:[birth year], 38 y.o., male Today's Date: 03/07/2024  END OF SESSION:  PT End of Session - 03/07/24 1400     Visit Number 4    Number of Visits 17    Date for PT Re-Evaluation 05/02/24    Authorization Type [employer]  reporting period from 02/08/2024    Authorization Time Period 01/18/24-02/15/25  VL:max 30 PT vsts per plan year    Authorization - Visit Number 4    Authorization - Number of Visits 30    Progress Note Due on Visit 10    PT Start Time 1352    PT Stop Time 1435    PT Time Calculation (min) 43 min    Activity Tolerance Patient tolerated treatment well    Behavior During Therapy WFL for tasks assessed/performed             Past Medical History:  Diagnosis Date   Arthritis    wrists   History of chlamydia    History of herpes genitalis    Non Hodgkin's lymphoma (HCC)    in remission. completed treatments 08/2012   Seizures (HCC)    x1. as infant   Past Surgical History:  Procedure Laterality Date   LYMPH NODE BIOPSY     SHOULDER ARTHROSCOPY WITH LABRAL REPAIR Right 06/14/2017   Procedure: SHOULDER ARTHROSCOPY WITH LABRAL REPAIR and subacromial debridement.;  Surgeon: Marchia Drivers, MD;  Location: [facility];  Service: Orthopedics;  Laterality: Right;   WISDOM TOOTH EXTRACTION     Patient Active Problem List   Diagnosis Date Noted   Knee pain, bilateral 04/28/2022   Anxiety due to invasive procedure 02/19/2022   Degeneration of lumbar intervertebral disc 02/19/2022   Stiffness of right knee 07/03/2021   Cervical spine pain 04/02/2021   Cervical radiculopathy 04/02/2021   Cervical spondylosis 04/02/2021   Strain of neck muscle 04/02/2021   Right knee pain 12/20/2020   Research study patient 12/06/2019   Abdominal pain 02/22/2019   Proteinuria 02/22/2019   Asymptomatic microscopic hematuria 02/22/2019   Pain in joint involving ankle and foot 02/01/2019    Achilles bursitis 02/01/2019   Achilles tendinitis 02/01/2019   Hip pain 02/01/2019   Lumbar sprain 02/01/2019   Sprain of deltoid ligament of ankle 02/01/2019   Sprain of hand 02/01/2019   Pre-bariatric surgery nutrition evaluation 06/03/2017   Gastroesophageal reflux disease 05/24/2017   Type 2 HSV infection of penis 05/24/2017   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   Shoulder joint pain 05/14/2017   Non-Hodgkin lymphoma (HCC) 04/14/2017   Bilateral carpal tunnel syndrome 01/28/2017   Tear of right glenoid labrum 10/09/2014   Diffuse large B-cell lymphoma of lymph nodes of neck (HCC) 08/02/2014   History of antineoplastic chemotherapy 11/22/2012   History of radiation therapy 11/22/2012   Allergic rhinitis 08/18/2007    PCP: Claudene Tanda POUR, PA-C  REFERRING PROVIDER: Kathlynn Sharper, MD  REFERRING DIAG: Left rotator cuff tendinitis  THERAPY DIAG:  Acute pain of left shoulder  Neuralgia and neuritis  Muscle weakness (generalized)  Rationale for Evaluation and Treatment: Rehabilitation  ONSET DATE: June 2025  SUBJECTIVE:  PERTINENT HISTORY: Patient is a 38 y.o. male who presents to outpatient physical therapy with a referral for medical diagnosis left rotator cuff tendinitis. This patient's chief complaints consist of left shoulder pain and arm weakness with intermittent paresthesia to the hand, leading to the following functional deficits: difficulty with all activities that require use of L UE including reaching, carrying, lifting, restraining and holding animals, working, sleeping, housework, peparing food, grooming, pushing, driving, yardwork, dressing, using tools or appliances, laundry, throwing a ball, playing basket ball, going to the gym. Relevant past medical history and comorbidities include  the following: he has Pain in joint involving ankle and foot; Achilles bursitis; Achilles tendinitis; Allergic rhinitis; Bilateral carpal tunnel syndrome; Boutonniere deformity; Diffuse large B-cell lymphoma of lymph nodes of neck (HCC); Gastroesophageal reflux disease; Hip pain; History of antineoplastic chemotherapy; Lumbar sprain; Non-Hodgkin lymphoma (HCC); Sprain of deltoid ligament of ankle; Sprain of hand; Tear of right glenoid labrum; Type 2 HSV infection of penis; Shoulder joint pain; History of radiation therapy; Abdominal pain; Proteinuria; Asymptomatic microscopic hematuria; Pre-bariatric surgery nutrition evaluation; Research study patient; Right knee pain; Cervical spine pain; Cervical radiculopathy; Cervical spondylosis; Strain of neck muscle; Stiffness of right knee; Knee pain, bilateral;  and Degeneration of lumbar intervertebral disc on their problem list. he  has a past medical history of Arthritis, History of chlamydia, History of herpes genitalis, Non Hodgkin's lymphoma (HCC), and Seizures (as a baby). he  has a past surgical history that includes Wisdom tooth extraction; Lymph node biopsy; and Shoulder arthroscopy with labral repair (Right, 06/14/2017). Patient denies hx of stroke, lung problems, heart problems, diabetes, unexplained weight loss, unexplained changes in bowel or bladder problems, unexplained stumbling or dropping things, osteoporosis, and spinal surgery, or left shoulder surgery.   Exercise history: likes to go to the gym and play basketball. Might have a 5#DB. Also has curling bench press thing. He still has a Photographer.   Right handed  SUBJECTIVE STATEMENT: Patient states his left shoulder/arm is feeling a little better. He has most of his pain in the left pec region and over the left UT. HE states his arm feels stronger but his hand grip still feels like it fatigues quickly and is weaker than before. He can pick up kittens now. He states HEP is going well except  one exercise (overhead ER) that feels weird. He states he played basketball once since last PT session and his legs got tired before his L arm did.  He does his shooting with the right UE.   PAIN: NPRS: Current: 2/10 L pec region  Best: 2-3/10, Worst: 8/10. Pain location: inside left shoulder, worst at anterior shoulder near pec, numbness over his whole left hand (currently there because he was poking the front of his shoulder).  Pain description: dull, Aggravating factors: the more he uses his left shoulder for anything weight wise including holding up kittens that are 2-3 lbs, reaching, sleeping on his left side (does not wake him), housework Relieving factors: as soon as he wakes up, hot bath  FUNCTIONAL LIMITATIONS: difficulty with all activities that require use of L UE including reaching, carrying, lifting, restraining and holding animals, working, sleeping, housework, preparing food, grooming, pushing, driving, yardwork, dressing, using tools or appliances, laundry, throwing a ball, playing basket ball, going to the gym.   LEISURE: going to the gym, play basketball  PRECAUTIONS: Other: don't lift anything over 10 lbs with left UE  WEIGHT BEARING RESTRICTIONS: No  FALLS:  Has patient fallen in last  6 months? 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   PLOF: Independent, no problems or limitations from left arm.   PATIENT GOALS: back to normal hoping it ain't going to tear  NEXT MD VISIT: 5 weeks from last visit with Dr. Kathlynn (yesterday)  OBJECTIVE  Grip strength (in pounds, average of three measures).  R: (100+90+84)/3 = 91.3 L: (80+76+80)/ 3 = 78.7   DTR (last tested 02/24/24); Biceps: B 2+ Triceps: B 2+ Wrist extensors: R 1+, L 2+ Pronator: R 0+, L 1+ Finger flexors:  R: only fired a couple of times, L 2+                                                                                                                            TREATMENT  Neuromuscular Re-education: a technique or exercise performed with the goal of improving the level of communication between the body and the brain, such as for balance, motor control, muscle activation patterns, coordination, desensitization, quality of muscle contraction, proprioception, and/or kinesthetic sense needed for successful and safe completion of functional activities.   Seated lat pull down at [facility] machine to improve lower trap activation and decrease UT activation 3x15 with 30# cable  Prone B shoulder ER with hands overhead to improve intrascapular activation and decrease UT dominance 1x20 with 5 second holds Needed cuing to perform correctly.   Prone L shoulder ER with towel roll in antecubital fossa at edge of table 1x15 AROM with 1 second hold 1x15 with 3#DB  and 1 second hold 1x15 with 5#DB  and 1 second hold  Therapeutic activities: dynamic therapeutic activities incorporating MULTIPLE parameters or areas of the body designed to achieve improved functional performance. Standing cervical thoracic extension/BUE flexion and serratus anterior activation, lat stretch, with foam roller up wall. To improve thoracic mobility for overhead reaching.  2x10 with 2 second holds  Chest press with DB 1x15 B UE with 10#DB in each side   Pt required multimodal cuing for proper technique and to facilitate improved neuromuscular control, strength, range of motion, and functional ability resulting in improved performance and form.    PATIENT EDUCATION: Education details: Exercise purpose/form. Self management techniques. Education on diagnosis, prognosis, POC, anatomy and physiology of current condition. Education on HEP including handout. Person educated: Patient Education method: Explanation Education comprehension: verbalized understanding and needs further  education  HOME EXERCISE PROGRAM: Access Code: [email or web address] URL: [email or web address] Date: 03/07/2024 Prepared by: Camie Cleverly  Program Notes [email or web address]   Exercises - Lat Pull with band/cable  - 1 x daily - 2 sets - 10 reps - 5 seconds hold - Prone Shoulder External Rotation with Towel Roll  - 1 x daily - 3 sets - 15 reps  HOME EXERCISE PROGRAM [[email or web address]]  Prone B shoulder ER for lower traps -  Repeat 20 Repetitions, Hold 5 Seconds, Complete 2 Sets, Perform 1 Times a Day  ASSESSMENT:  CLINICAL IMPRESSION: Continued with exercises for shoulder girdle and intrascapulars without excessive activation of B UT. Patient able to progress to prone ER at 90 degrees and chest press with good tolerance. He would benefit from more interventions to address thoracic spine stiffness next session. Patient would benefit from continued management of limiting condition by skilled physical therapist to address remaining impairments and functional limitations to work towards stated goals and return to PLOF or maximal functional independence.    From initial PT evaluation  on 02/08/24:  Patient is a 38 y.o. male referred to outpatient physical therapy with a medical diagnosis of left rotator cuff tendinitis who presents with signs and symptoms consistent with left shoulder pain and L UE weakness and paresthesia following an injury in June. He appears to have myotomal weakness at C5 and C6 that improves with cervical unloading thorough distraction, suggesting a cervical spine contribution to his condition. This would match with the has left sided upper trap discomfort he experiences with left cervical spine rotation, sidebending, and extension and the intermittent paresthesia to his hand. However, his DTRs appear equal and intact bilaterally. C5 myotomes tested today also correspond to the same motions  most commonly affected by a RTC condition (shoulder ER  and abduction). Plan to address RTC and shoulder as well as any cervical spine contributions. Patient has good motion B shoulders except some limited overhead movement in the L. Patient presents with significant pain, ROM, joint stiffness, muscle performance (strength/power/endurance), paresthesia, and activity tolerance impairments that are limiting ability to complete all activities that require use of L UE including reaching, carrying, lifting, restraining and holding animals, working, sleeping, housework, peparing food, grooming, pushing, driving, yardwork, dressing, using tools or appliances, laundry, throwing a ball, playing basket ball, going to the gym without difficulty. Patient will benefit from skilled physical therapy intervention to address current body structure impairments and activity limitations to improve function and work towards goals set in current POC in order to return to prior level of function or maximal functional improvement.    OBJECTIVE IMPAIRMENTS: decreased activity tolerance, decreased endurance, decreased knowledge of condition, decreased ROM, decreased strength, impaired UE functional use, improper body mechanics, postural dysfunction, and pain.   ACTIVITY LIMITATIONS: carrying, lifting, sleeping, bathing, dressing, reach over head, hygiene/grooming, and caring for others  PARTICIPATION LIMITATIONS: meal prep, cleaning, laundry, interpersonal relationship, driving, shopping, community activity, occupation, and yard work  PERSONAL FACTORS: Profession and 3+ comorbidities:   Allergic rhinitis; Bilateral carpal tunnel syndrome; Boutonniere deformity; Diffuse large B-cell lymphoma of lymph nodes of neck (HCC); Gastroesophageal reflux disease; Hip pain; History of antineoplastic chemotherapy; Lumbar sprain; Non-Hodgkin lymphoma (HCC);  Tear of right glenoid labrum; Shoulder joint pain; History of radiation therapy;  Cervical spine pain; Cervical radiculopathy; Cervical  spondylosis;  and Degeneration of lumbar intervertebral disc on their problem list. he  has a past medical history of Arthritis,  Non Hodgkin's lymphoma (HCC), and Seizures (as a baby). he  has a past surgical history that includes Wisdom tooth extraction; Lymph node biopsy; and Shoulder arthroscopy with labral repair (Right, 06/14/2017) are also affecting patient's functional outcome.   REHAB POTENTIAL: Good  CLINICAL DECISION MAKING: Evolving/moderate complexity  EVALUATION COMPLEXITY: Moderate   GOALS: Goals reviewed with patient? No  SHORT TERM GOALS: Target date: 02/22/2024  Patient will be independent with initial home exercise program for self-management of symptoms. Baseline: Initial HEP to be provided  at visit 2 as appropriate (02/08/24); Goal status: In progress   LONG TERM GOALS: Target date: 05/02/2024  Patient will be independent with a long-term home exercise program for self-management of symptoms.  Baseline: Initial HEP to be provided at visit 2 as appropriate (02/08/24); Goal status: In progress  2.  Patient will demonstrate improved Upper Extremity Functional Scale (UEFS) to equal or greater than 64/80 to demonstrate improvement in overall condition and self-reported functional ability.  Baseline: 34/80 (02/08/24); Goal status: In progress  3.  Patient will demonstrate pain free L UE strength equal or greater to R UE strength to improve his ability to complete his job duties.  Baseline: decreased and painful (02/08/24); Goal status: In progress  4.  Patient will demonstrate L shoulder AROM/PROM pain free and equal or greater to R shoulder to improve his ability to reach overhead and move into awkward positions while performing his job duties and leisure activities such as basketball and going to the gym.  Baseline: limited in overhead movement and painful (02/08/24); Goal status: In progress  5.  Patient will demonstrate improvement in Patient Specific Functional  Scale (PSFS) of equal or greater than 8/10 points to reflect clinically significant improvement in patient's most valued functional activities. Baseline: to be measured at visit 2 as appropriate (02/08/24); 1/10 (02/10/24);  Goal status: In progress  6.  Patient will report NPRS equal or less than 2/10 during functional activities during the last 2 weeks to improve their abilitly to complete community, work and/or recreational activities with less limitation. Baseline: 8/10 (02/08/24); Goal status: In progress   PLAN:  PT FREQUENCY: 1-2x/week  PT DURATION: 12 weeks  PLANNED INTERVENTIONS: 97164- PT Re-evaluation, 97750- Physical Performance Testing, 97110-Therapeutic exercises, 97530- Therapeutic activity, [id number]- Neuromuscular re-education, 97535- Self Care, 02859- Manual therapy, [id number]- Electrical stimulation (unattended), [id number]- Traction (mechanical), 20560 (1-2 muscles), 20561 (3+ muscles)- Dry Needling, Patient/Family education, Joint mobilization, Spinal mobilization, Cryotherapy, and Moist heat  PLAN FOR NEXT SESSION: update HEP as appropriate, activity modifications to accommodate mechanical stressors and allow healing, then motor control exercises to teach improved mechanics, progressive loading of the shoulder girdle, and reconditioning of the movements and tasks that previously irritated condition. Manual therapy as needed to restore motion and provide sympotm modulation.    Camie SAUNDERS. Juli, PT, DPT, Cert. MDT 03/07/24, 3:00 PM  [facility] [address] [place], [place] [address] P: [phone] I F: [phone]

## 2024-03-08 ENCOUNTER — Encounter: Payer: Self-pay | Admitting: Physical Therapy

## 2024-03-08 ENCOUNTER — Ambulatory Visit: Admitting: Physical Therapy

## 2024-03-08 DIAGNOSIS — M25512 Pain in left shoulder: Secondary | ICD-10-CM | POA: Diagnosis not present

## 2024-03-08 DIAGNOSIS — M6281 Muscle weakness (generalized): Secondary | ICD-10-CM

## 2024-03-08 DIAGNOSIS — M792 Neuralgia and neuritis, unspecified: Secondary | ICD-10-CM

## 2024-03-08 NOTE — Therapy (Signed)
 OUTPATIENT PHYSICAL THERAPY TREATEMENT   Patient Name: Thomas Mckay MRN: [id number] DOB:October 10, 1985, 38 y.o., male Today's Date: 03/08/2024  END OF SESSION:  PT End of Session - 03/08/24 1607     Visit Number 5    Number of Visits 17    Date for PT Re-Evaluation 05/02/24    Authorization Type [employer]  reporting period from 02/08/2024    Authorization Time Period 01/18/24-02/15/25  VL:max 30 PT vsts per plan year    Authorization - Visit Number 5    Authorization - Number of Visits 30    Progress Note Due on Visit 10    PT Start Time 1605    PT Stop Time 1643    PT Time Calculation (min) 38 min    Activity Tolerance Patient tolerated treatment well    Behavior During Therapy WFL for tasks assessed/performed              Past Medical History:  Diagnosis Date   Arthritis    wrists   History of chlamydia    History of herpes genitalis    Non Hodgkin's lymphoma (HCC)    in remission. completed treatments 08/2012   Seizures (HCC)    x1. as infant   Past Surgical History:  Procedure Laterality Date   LYMPH NODE BIOPSY     SHOULDER ARTHROSCOPY WITH LABRAL REPAIR Right 06/14/2017   Procedure: SHOULDER ARTHROSCOPY WITH LABRAL REPAIR and subacromial debridement.;  Surgeon: Marchia Drivers, MD;  Location: [facility];  Service: Orthopedics;  Laterality: Right;   WISDOM TOOTH EXTRACTION     Patient Active Problem List   Diagnosis Date Noted   Knee pain, bilateral 04/28/2022   Anxiety due to invasive procedure 02/19/2022   Degeneration of lumbar intervertebral disc 02/19/2022   Stiffness of right knee 07/03/2021   Cervical spine pain 04/02/2021   Cervical radiculopathy 04/02/2021   Cervical spondylosis 04/02/2021   Strain of neck muscle 04/02/2021   Right knee pain 12/20/2020   Research study patient 12/06/2019   Abdominal pain 02/22/2019   Proteinuria 02/22/2019   Asymptomatic microscopic hematuria 02/22/2019   Pain in joint involving ankle and foot 02/01/2019    Achilles bursitis 02/01/2019   Achilles tendinitis 02/01/2019   Hip pain 02/01/2019   Lumbar sprain 02/01/2019   Sprain of deltoid ligament of ankle 02/01/2019   Sprain of hand 02/01/2019   Pre-bariatric surgery nutrition evaluation 06/03/2017   Gastroesophageal reflux disease 05/24/2017   Type 2 HSV infection of penis 05/24/2017   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   Shoulder joint pain 05/14/2017   Non-Hodgkin lymphoma (HCC) 04/14/2017   Bilateral carpal tunnel syndrome 01/28/2017   Tear of right glenoid labrum 10/09/2014   Diffuse large B-cell lymphoma of lymph nodes of neck (HCC) 08/02/2014   History of antineoplastic chemotherapy 11/22/2012   History of radiation therapy 11/22/2012   Allergic rhinitis 08/18/2007    PCP: Claudene Tanda POUR, PA-C  REFERRING PROVIDER: Kathlynn Sharper, MD  REFERRING DIAG: Left rotator cuff tendinitis  THERAPY DIAG:  Acute pain of left shoulder  Neuralgia and neuritis  Muscle weakness (generalized)  Rationale for Evaluation and Treatment: Rehabilitation  ONSET DATE: June 2025  SUBJECTIVE:  PERTINENT HISTORY: Patient is a 38 y.o. male who presents to outpatient physical therapy with a referral for medical diagnosis left rotator cuff tendinitis. This patient's chief complaints consist of left shoulder pain and arm weakness with intermittent paresthesia to the hand, leading to the following functional deficits: difficulty with all activities that require use of L UE including reaching, carrying, lifting, restraining and holding animals, working, sleeping, housework, peparing food, grooming, pushing, driving, yardwork, dressing, using tools or appliances, laundry, throwing a ball, playing basket ball, going to the gym. Relevant past medical history and comorbidities  include the following: he has Pain in joint involving ankle and foot; Achilles bursitis; Achilles tendinitis; Allergic rhinitis; Bilateral carpal tunnel syndrome; Boutonniere deformity; Diffuse large B-cell lymphoma of lymph nodes of neck (HCC); Gastroesophageal reflux disease; Hip pain; History of antineoplastic chemotherapy; Lumbar sprain; Non-Hodgkin lymphoma (HCC); Sprain of deltoid ligament of ankle; Sprain of hand; Tear of right glenoid labrum; Type 2 HSV infection of penis; Shoulder joint pain; History of radiation therapy; Abdominal pain; Proteinuria; Asymptomatic microscopic hematuria; Pre-bariatric surgery nutrition evaluation; Research study patient; Right knee pain; Cervical spine pain; Cervical radiculopathy; Cervical spondylosis; Strain of neck muscle; Stiffness of right knee; Knee pain, bilateral;  and Degeneration of lumbar intervertebral disc on their problem list. he  has a past medical history of Arthritis, History of chlamydia, History of herpes genitalis, Non Hodgkin's lymphoma (HCC), and Seizures (as a baby). he  has a past surgical history that includes Wisdom tooth extraction; Lymph node biopsy; and Shoulder arthroscopy with labral repair (Right, 06/14/2017). Patient denies hx of stroke, lung problems, heart problems, diabetes, unexplained weight loss, unexplained changes in bowel or bladder problems, unexplained stumbling or dropping things, osteoporosis, and spinal surgery, or left shoulder surgery.   Exercise history: likes to go to the gym and play basketball. Might have a 5#DB. Also has curling bench press thing. He still has a Photographer.   Right handed  SUBJECTIVE STATEMENT: Patient states he is feeling well today. He felt okay after last PT session. Later in session reports he has been having low back spasms/pain R > L for the past 4 days that were unaffected by PT yesterday.   PAIN: NPRS: Current: 2/10 L pec region  Best: 2-3/10, Worst: 8/10. Pain location: inside  left shoulder, worst at anterior shoulder near pec, numbness over his whole left hand (currently there because he was poking the front of his shoulder).  Pain description: dull, Aggravating factors: the more he uses his left shoulder for anything weight wise including holding up kittens that are 2-3 lbs, reaching, sleeping on his left side (does not wake him), housework Relieving factors: as soon as he wakes up, hot bath  FUNCTIONAL LIMITATIONS: difficulty with all activities that require use of L UE including reaching, carrying, lifting, restraining and holding animals, working, sleeping, housework, preparing food, grooming, pushing, driving, yardwork, dressing, using tools or appliances, laundry, throwing a ball, playing basket ball, going to the gym.   LEISURE: going to the gym, play basketball  PRECAUTIONS: Other: don't lift anything over 10 lbs with left UE  WEIGHT BEARING RESTRICTIONS: No  FALLS:  Has patient fallen in last 6 months? 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   PLOF: Independent, no problems or limitations from left arm.   PATIENT GOALS: back to normal  hoping it ain't going to tear  NEXT MD VISIT: 5 weeks from last visit with Dr. Kathlynn   OBJECTIVE  Grip strength (in pounds, average of three measures, last tested 03/08/24).  R: (100+90+84)/3 = 91.3 L: (80+76+80)/ 3 = 78.7   DTR (last tested 02/24/24); Biceps: B 2+ Triceps: B 2+ Wrist extensors: R 1+, L 2+ Pronator: R 0+, L 1+ Finger flexors:  R: only fired a couple of times, L 2+                                                                                                                           TREATMENT   Therapeutic activities: dynamic therapeutic activities incorporating MULTIPLE parameters or areas of the body designed to achieve improved functional  performance.  Seated lat pull down at [facility] machine to improve lower trap activation and decrease UT activation 3x15 with 35# cable  Quadruped thoracic rotation AROM with hand behind back to improve ability to rotate and reach Attempted to right but caused low back muscle spasms. Modified to buttocks on heels position but still causing low back spasms so discontinued.  Therapeutic exercise: therapeutic exercises that incorporate ONE parameter at one or more areas of the body to centralize symptoms, develop strength and endurance, range of motion, and flexibility required for successful completion of functional activities.  Thoracic spine PA gapping self mob over Yoga tune up ball peanut 1x5 breaths over 5 levels from mid to upper thoracic spine Peanut rolled in a towel for comfort PT helping adjust peanut each level  Sidelying modified open book with knees to chest and top hand on chest  1x10 each side with exhale  Neuromuscular Re-education: a technique or exercise performed with the goal of improving the level of communication between the body and the brain, such as for balance, motor control, muscle activation patterns, coordination, desensitization, quality of muscle contraction, proprioception, and/or kinesthetic sense needed for successful and safe completion of functional activities.   Prone B shoulder ER with hands overhead to improve intrascapular activation and decrease UT dominance 1x15 with 1 second holds with 2#DB 1x15 with 1 second holds with 3#DB Needed cuing to perform correctly.   Prone L shoulder ER with towel roll in antecubital fossa at edge of table 3x15 with 5#DB  and 1 second hold  Pt required multimodal cuing for proper technique and to facilitate improved neuromuscular control, strength, range of motion, and functional ability resulting in improved performance and form.    PATIENT EDUCATION: Education details: Exercise purpose/form. Self management  techniques. Education on diagnosis, prognosis, POC, anatomy and physiology of current condition. Education on HEP including handout. Person educated: Patient Education method: Explanation Education comprehension: verbalized understanding and needs further education  HOME EXERCISE PROGRAM: Access Code: [email or web address] URL: [email or web address] Date: 03/07/2024 Prepared by: Camie Cleverly  Program Notes [email or web address]   Exercises - Lat Pull with band/cable  - 1 x daily - 2 sets - 10 reps - 5 seconds hold -  Prone Shoulder External Rotation with Towel Roll  - 1 x daily - 3 sets - 15 reps  HOME EXERCISE PROGRAM [[email or web address]]  Prone B shoulder ER for lower traps -  Repeat 20 Repetitions, Hold 5 Seconds, Complete 2 Sets, Perform 1 Times a Day  ASSESSMENT:  CLINICAL IMPRESSION: Today's session initially focused on addressing thoracic spine mobility. Patient tolerated interventions for this well until reaching gravity resisted thoracic rotation due to pain in the right lumbar spine. Also continued with shoulder girdle strengthening and motor control exercises. Patient would benefit from continued management of limiting condition by skilled physical therapist to address remaining impairments and functional limitations to work towards stated goals and return to PLOF or maximal functional independence.   From initial PT evaluation  on 02/08/24:  Patient is a 38 y.o. male referred to outpatient physical therapy with a medical diagnosis of left rotator cuff tendinitis who presents with signs and symptoms consistent with left shoulder pain and L UE weakness and paresthesia following an injury in June. He appears to have myotomal weakness at C5 and C6 that improves with cervical unloading thorough distraction, suggesting a cervical spine contribution to his condition. This would match with the has left sided upper trap discomfort he experiences with left  cervical spine rotation, sidebending, and extension and the intermittent paresthesia to his hand. However, his DTRs appear equal and intact bilaterally. C5 myotomes tested today also correspond to the same motions  most commonly affected by a RTC condition (shoulder ER and abduction). Plan to address RTC and shoulder as well as any cervical spine contributions. Patient has good motion B shoulders except some limited overhead movement in the L. Patient presents with significant pain, ROM, joint stiffness, muscle performance (strength/power/endurance), paresthesia, and activity tolerance impairments that are limiting ability to complete all activities that require use of L UE including reaching, carrying, lifting, restraining and holding animals, working, sleeping, housework, peparing food, grooming, pushing, driving, yardwork, dressing, using tools or appliances, laundry, throwing a ball, playing basket ball, going to the gym without difficulty. Patient will benefit from skilled physical therapy intervention to address current body structure impairments and activity limitations to improve function and work towards goals set in current POC in order to return to prior level of function or maximal functional improvement.    OBJECTIVE IMPAIRMENTS: decreased activity tolerance, decreased endurance, decreased knowledge of condition, decreased ROM, decreased strength, impaired UE functional use, improper body mechanics, postural dysfunction, and pain.   ACTIVITY LIMITATIONS: carrying, lifting, sleeping, bathing, dressing, reach over head, hygiene/grooming, and caring for others  PARTICIPATION LIMITATIONS: meal prep, cleaning, laundry, interpersonal relationship, driving, shopping, community activity, occupation, and yard work  PERSONAL FACTORS: Profession and 3+ comorbidities:   Allergic rhinitis; Bilateral carpal tunnel syndrome; Boutonniere deformity; Diffuse large B-cell lymphoma of lymph nodes of neck (HCC);  Gastroesophageal reflux disease; Hip pain; History of antineoplastic chemotherapy; Lumbar sprain; Non-Hodgkin lymphoma (HCC);  Tear of right glenoid labrum; Shoulder joint pain; History of radiation therapy;  Cervical spine pain; Cervical radiculopathy; Cervical spondylosis;  and Degeneration of lumbar intervertebral disc on their problem list. he  has a past medical history of Arthritis,  Non Hodgkin's lymphoma (HCC), and Seizures (as a baby). he  has a past surgical history that includes Wisdom tooth extraction; Lymph node biopsy; and Shoulder arthroscopy with labral repair (Right, 06/14/2017) are also affecting patient's functional outcome.   REHAB POTENTIAL: Good  CLINICAL DECISION MAKING: Evolving/moderate complexity  EVALUATION COMPLEXITY: Moderate   GOALS: Goals reviewed with patient?  No  SHORT TERM GOALS: Target date: 02/22/2024  Patient will be independent with initial home exercise program for self-management of symptoms. Baseline: Initial HEP to be provided at visit 2 as appropriate (02/08/24); Goal status: In progress   LONG TERM GOALS: Target date: 05/02/2024  Patient will be independent with a long-term home exercise program for self-management of symptoms.  Baseline: Initial HEP to be provided at visit 2 as appropriate (02/08/24); Goal status: In progress  2.  Patient will demonstrate improved Upper Extremity Functional Scale (UEFS) to equal or greater than 64/80 to demonstrate improvement in overall condition and self-reported functional ability.  Baseline: 34/80 (02/08/24); Goal status: In progress  3.  Patient will demonstrate pain free L UE strength equal or greater to R UE strength to improve his ability to complete his job duties.  Baseline: decreased and painful (02/08/24); Goal status: In progress  4.  Patient will demonstrate L shoulder AROM/PROM pain free and equal or greater to R shoulder to improve his ability to reach overhead and move into awkward positions  while performing his job duties and leisure activities such as basketball and going to the gym.  Baseline: limited in overhead movement and painful (02/08/24); Goal status: In progress  5.  Patient will demonstrate improvement in Patient Specific Functional Scale (PSFS) of equal or greater than 8/10 points to reflect clinically significant improvement in patient's most valued functional activities. Baseline: to be measured at visit 2 as appropriate (02/08/24); 1/10 (02/10/24);  Goal status: In progress  6.  Patient will report NPRS equal or less than 2/10 during functional activities during the last 2 weeks to improve their abilitly to complete community, work and/or recreational activities with less limitation. Baseline: 8/10 (02/08/24); Goal status: In progress   PLAN:  PT FREQUENCY: 1-2x/week  PT DURATION: 12 weeks  PLANNED INTERVENTIONS: 97164- PT Re-evaluation, 97750- Physical Performance Testing, 97110-Therapeutic exercises, 97530- Therapeutic activity, [id number]- Neuromuscular re-education, 97535- Self Care, [id number]- Manual therapy, [id number]- Electrical stimulation (unattended), [phone]- Traction (mechanical), 20560 (1-2 muscles), 20561 (3+ muscles)- Dry Needling, Patient/Family education, Joint mobilization, Spinal mobilization, Cryotherapy, and Moist heat  PLAN FOR NEXT SESSION: update HEP as appropriate, activity modifications to accommodate mechanical stressors and allow healing, then motor control exercises to teach improved mechanics, progressive loading of the shoulder girdle, and reconditioning of the movements and tasks that previously irritated condition. Manual therapy as needed to restore motion and provide sympotm modulation.    Camie SAUNDERS. Juli, PT, DPT, Cert. MDT 03/08/24, 5:59 PM  [facility] [address] [place], [place] [address] P: [phone] I F: [phone]

## 2024-03-13 ENCOUNTER — Ambulatory Visit: Admitting: Physical Therapy

## 2024-03-13 DIAGNOSIS — M7582 Other shoulder lesions, left shoulder: Secondary | ICD-10-CM | POA: Diagnosis not present

## 2024-03-14 ENCOUNTER — Ambulatory Visit: Admitting: Physical Therapy

## 2024-03-14 ENCOUNTER — Encounter: Payer: Self-pay | Admitting: Physical Therapy

## 2024-03-14 ENCOUNTER — Other Ambulatory Visit: Payer: Self-pay | Admitting: Orthopedic Surgery

## 2024-03-14 DIAGNOSIS — M792 Neuralgia and neuritis, unspecified: Secondary | ICD-10-CM | POA: Diagnosis not present

## 2024-03-14 DIAGNOSIS — M6281 Muscle weakness (generalized): Secondary | ICD-10-CM | POA: Diagnosis not present

## 2024-03-14 DIAGNOSIS — M25512 Pain in left shoulder: Secondary | ICD-10-CM

## 2024-03-14 DIAGNOSIS — M7582 Other shoulder lesions, left shoulder: Secondary | ICD-10-CM

## 2024-03-14 NOTE — Therapy (Signed)
 OUTPATIENT PHYSICAL THERAPY TREATEMENT   Patient Name: Thomas Mckay MRN: [id number] DOB:07-25-1985, 38 y.o., male Today's Date: 03/14/2024  END OF SESSION:  PT End of Session - 03/14/24 1120     Visit Number 6    Number of Visits 17    Date for PT Re-Evaluation 05/02/24    Authorization Type [employer]  reporting period from 02/08/2024    Authorization Time Period 01/18/24-02/15/25  VL:max 30 PT vsts per plan year    Authorization - Visit Number 6    Authorization - Number of Visits 30    Progress Note Due on Visit 10    PT Start Time 1116    PT Stop Time 1155    PT Time Calculation (min) 39 min    Activity Tolerance Patient tolerated treatment well    Behavior During Therapy WFL for tasks assessed/performed               Past Medical History:  Diagnosis Date   Arthritis    wrists   History of chlamydia    History of herpes genitalis    Non Hodgkin's lymphoma (HCC)    in remission. completed treatments 08/2012   Seizures (HCC)    x1. as infant   Past Surgical History:  Procedure Laterality Date   LYMPH NODE BIOPSY     SHOULDER ARTHROSCOPY WITH LABRAL REPAIR Right 06/14/2017   Procedure: SHOULDER ARTHROSCOPY WITH LABRAL REPAIR and subacromial debridement.;  Surgeon: Marchia Drivers, MD;  Location: [facility];  Service: Orthopedics;  Laterality: Right;   WISDOM TOOTH EXTRACTION     Patient Active Problem List   Diagnosis Date Noted   Knee pain, bilateral 04/28/2022   Anxiety due to invasive procedure 02/19/2022   Degeneration of lumbar intervertebral disc 02/19/2022   Stiffness of right knee 07/03/2021   Cervical spine pain 04/02/2021   Cervical radiculopathy 04/02/2021   Cervical spondylosis 04/02/2021   Strain of neck muscle 04/02/2021   Right knee pain 12/20/2020   Research study patient 12/06/2019   Abdominal pain 02/22/2019   Proteinuria 02/22/2019   Asymptomatic microscopic hematuria 02/22/2019   Pain in joint involving ankle and foot 02/01/2019    Achilles bursitis 02/01/2019   Achilles tendinitis 02/01/2019   Hip pain 02/01/2019   Lumbar sprain 02/01/2019   Sprain of deltoid ligament of ankle 02/01/2019   Sprain of hand 02/01/2019   Pre-bariatric surgery nutrition evaluation 06/03/2017   Gastroesophageal reflux disease 05/24/2017   Type 2 HSV infection of penis 05/24/2017   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   Shoulder joint pain 05/14/2017   Non-Hodgkin lymphoma (HCC) 04/14/2017   Bilateral carpal tunnel syndrome 01/28/2017   Tear of right glenoid labrum 10/09/2014   Diffuse large B-cell lymphoma of lymph nodes of neck (HCC) 08/02/2014   History of antineoplastic chemotherapy 11/22/2012   History of radiation therapy 11/22/2012   Allergic rhinitis 08/18/2007    PCP: Claudene Tanda POUR, PA-C  REFERRING PROVIDER: Kathlynn Sharper, MD  REFERRING DIAG: Left rotator cuff tendinitis  THERAPY DIAG:  Acute pain of left shoulder  Neuralgia and neuritis  Muscle weakness (generalized)  Rationale for Evaluation and Treatment: Rehabilitation  ONSET DATE: June 2025  SUBJECTIVE:  PERTINENT HISTORY: Patient is a 38 y.o. male who presents to outpatient physical therapy with a referral for medical diagnosis left rotator cuff tendinitis. This patient's chief complaints consist of left shoulder pain and arm weakness with intermittent paresthesia to the hand, leading to the following functional deficits: difficulty with all activities that require use of L UE including reaching, carrying, lifting, restraining and holding animals, working, sleeping, housework, peparing food, grooming, pushing, driving, yardwork, dressing, using tools or appliances, laundry, throwing a ball, playing basket ball, going to the gym. Relevant past medical history and comorbidities  include the following: he has Pain in joint involving ankle and foot; Achilles bursitis; Achilles tendinitis; Allergic rhinitis; Bilateral carpal tunnel syndrome; Boutonniere deformity; Diffuse large B-cell lymphoma of lymph nodes of neck (HCC); Gastroesophageal reflux disease; Hip pain; History of antineoplastic chemotherapy; Lumbar sprain; Non-Hodgkin lymphoma (HCC); Sprain of deltoid ligament of ankle; Sprain of hand; Tear of right glenoid labrum; Type 2 HSV infection of penis; Shoulder joint pain; History of radiation therapy; Abdominal pain; Proteinuria; Asymptomatic microscopic hematuria; Pre-bariatric surgery nutrition evaluation; Research study patient; Right knee pain; Cervical spine pain; Cervical radiculopathy; Cervical spondylosis; Strain of neck muscle; Stiffness of right knee; Knee pain, bilateral;  and Degeneration of lumbar intervertebral disc on their problem list. he  has a past medical history of Arthritis, History of chlamydia, History of herpes genitalis, Non Hodgkin's lymphoma (HCC), and Seizures (as a baby). he  has a past surgical history that includes Wisdom tooth extraction; Lymph node biopsy; and Shoulder arthroscopy with labral repair (Right, 06/14/2017). Patient denies hx of stroke, lung problems, heart problems, diabetes, unexplained weight loss, unexplained changes in bowel or bladder problems, unexplained stumbling or dropping things, osteoporosis, and spinal surgery, or left shoulder surgery.   Exercise history: likes to go to the gym and play basketball. Might have a 5#DB. Also has curling bench press thing. He still has a Photographer.   Right handed  SUBJECTIVE STATEMENT: Patient states he is feeling okay today. He states he felt good after last PT session.   Top three problems: abduction of the left shoulder (pain and weakness)  PAIN: NPRS: Current: 2/10 L pec region  Best: 2-3/10, Worst: 8/10. Pain location: inside left shoulder, worst at anterior shoulder near  pec, numbness over his whole left hand (currently there because he was poking the front of his shoulder).  Pain description: dull, Aggravating factors: the more he uses his left shoulder for anything weight wise including holding up kittens that are 2-3 lbs, reaching, sleeping on his left side (does not wake him), housework Relieving factors: as soon as he wakes up, hot bath   PRECAUTIONS: Other: don't lift anything over 10 lbs with left UE   PATIENT GOALS: back to normal hoping it ain't going to tear  NEXT MD VISIT: 5 weeks from last visit with Dr. Kathlynn   OBJECTIVE  Grip strength (in pounds, average of three measures, last tested 03/08/24).  R: (100+90+84)/3 = 91.3 L: (80+76+80)/ 3 = 78.7  DTR (last tested 02/24/24); Biceps: B 2+ Triceps: B 2+ Wrist extensors: R 1+, L 2+ Pronator: R 0+, L 1+ Finger flexors:  R: only fired a couple of times, L 2+  TREATMENT   Therapeutic activities: dynamic therapeutic activities incorporating MULTIPLE parameters or areas of the body designed to achieve improved functional performance.  Seated lat pull down at [facility] machine to improve lower trap activation and decrease UT activation 1x15 with 35# cable 2x15 with 45# cable  Supine chest press with plus 1x10 with 10#DB each hand  2x10 with 15#DB each hand (still pretty easy).   Prone partial range of motion row on bench focused on intrascapular muscle training 2x10 with 5 second holds with 15# DB in each hand Chin resting on bench  Manual therapy: to reduce pain and tissue tension, improve range of motion, neuromodulation, in order to promote improved ability to complete functional activities. HOOKLYING STM to left pec major and minor, left upper trap, left biceps, left anterior deltoid, left infraspinatus  Most discomfort at left pec major, but some at UT and anterior  deltoid.  L shoulder PROM flexion and abduction  full and no pain until brought arm back to side Cervical spine manual traction 10x10 seconds on/off  Neuromuscular Re-education: a technique or exercise performed with the goal of improving the level of communication between the body and the brain, such as for balance, motor control, muscle activation patterns, coordination, desensitization, quality of muscle contraction, proprioception, and/or kinesthetic sense needed for successful and safe completion of functional activities.   Standing isometric shoulder ER at neutral then 90 degrees 1x10 with 5 second holds each position using RTB loop Heavy cuing at first for form Attempted with GTB first and too challenging.  No increase in pain Pain at right to of shoulder  Pt required multimodal cuing for proper technique and to facilitate improved neuromuscular control, strength, range of motion, and functional ability resulting in improved performance and form.    PATIENT EDUCATION: Education details: Exercise purpose/form. Self management techniques. Education on diagnosis, prognosis, POC, anatomy and physiology of current condition. Education on HEP including handout. Person educated: Patient Education method: Explanation Education comprehension: verbalized understanding and needs further education  HOME EXERCISE PROGRAM: Access Code: [email or web address] URL: [email or web address] Date: 03/07/2024 Prepared by: Camie Cleverly  Program Notes [email or web address]   Exercises - Lat Pull with band/cable  - 1 x daily - 2 sets - 10 reps - 5 seconds hold - Prone Shoulder External Rotation with Towel Roll  - 1 x daily - 3 sets - 15 reps  HOME EXERCISE PROGRAM [[email or web address]]  Prone B shoulder ER for lower traps -  Repeat 20 Repetitions, Hold 5 Seconds, Complete 2 Sets, Perform 1 Times a Day  ASSESSMENT:  CLINICAL IMPRESSION: Today's session focused on  decreasing pain and tension at left pec region and improving functional strength and motor control. Patient tolerated session well with on increase in pain by end of session. He continues to require cuing for correct form as he tends to fall into a mildly kyphotic and forward head position with protracted scapulae with lumbar hyperlordosis when completing exercises which likely contributes to irritation of his shoulder and arm. Plan to start bicep curls and update HEP next session as appropriate. Patient would benefit from continued management of limiting condition by skilled physical therapist to address remaining impairments and functional limitations to work towards stated goals and return to PLOF or maximal functional independence.    From initial PT evaluation  on 02/08/24:  Patient is a 38 y.o. male referred to outpatient physical therapy with a medical diagnosis of left rotator cuff tendinitis who presents with signs and symptoms consistent with left shoulder pain and  L UE weakness and paresthesia following an injury in June. He appears to have myotomal weakness at C5 and C6 that improves with cervical unloading thorough distraction, suggesting a cervical spine contribution to his condition. This would match with the has left sided upper trap discomfort he experiences with left cervical spine rotation, sidebending, and extension and the intermittent paresthesia to his hand. However, his DTRs appear equal and intact bilaterally. C5 myotomes tested today also correspond to the same motions  most commonly affected by a RTC condition (shoulder ER and abduction). Plan to address RTC and shoulder as well as any cervical spine contributions. Patient has good motion B shoulders except some limited overhead movement in the L. Patient presents with significant pain, ROM, joint stiffness, muscle performance (strength/power/endurance), paresthesia, and activity tolerance impairments that are limiting ability to  complete all activities that require use of L UE including reaching, carrying, lifting, restraining and holding animals, working, sleeping, housework, peparing food, grooming, pushing, driving, yardwork, dressing, using tools or appliances, laundry, throwing a ball, playing basket ball, going to the gym without difficulty. Patient will benefit from skilled physical therapy intervention to address current body structure impairments and activity limitations to improve function and work towards goals set in current POC in order to return to prior level of function or maximal functional improvement.    OBJECTIVE IMPAIRMENTS: decreased activity tolerance, decreased endurance, decreased knowledge of condition, decreased ROM, decreased strength, impaired UE functional use, improper body mechanics, postural dysfunction, and pain.   ACTIVITY LIMITATIONS: carrying, lifting, sleeping, bathing, dressing, reach over head, hygiene/grooming, and caring for others  PARTICIPATION LIMITATIONS: meal prep, cleaning, laundry, interpersonal relationship, driving, shopping, community activity, occupation, and yard work  PERSONAL FACTORS: Profession and 3+ comorbidities:   Allergic rhinitis; Bilateral carpal tunnel syndrome; Boutonniere deformity; Diffuse large B-cell lymphoma of lymph nodes of neck (HCC); Gastroesophageal reflux disease; Hip pain; History of antineoplastic chemotherapy; Lumbar sprain; Non-Hodgkin lymphoma (HCC);  Tear of right glenoid labrum; Shoulder joint pain; History of radiation therapy;  Cervical spine pain; Cervical radiculopathy; Cervical spondylosis;  and Degeneration of lumbar intervertebral disc on their problem list. he  has a past medical history of Arthritis,  Non Hodgkin's lymphoma (HCC), and Seizures (as a baby). he  has a past surgical history that includes Wisdom tooth extraction; Lymph node biopsy; and Shoulder arthroscopy with labral repair (Right, 06/14/2017) are also affecting patient's  functional outcome.   REHAB POTENTIAL: Good  CLINICAL DECISION MAKING: Evolving/moderate complexity  EVALUATION COMPLEXITY: Moderate   GOALS: Goals reviewed with patient? No  SHORT TERM GOALS: Target date: 02/22/2024  Patient will be independent with initial home exercise program for self-management of symptoms. Baseline: Initial HEP to be provided at visit 2 as appropriate (02/08/24); Goal status: In progress   LONG TERM GOALS: Target date: 05/02/2024  Patient will be independent with a long-term home exercise program for self-management of symptoms.  Baseline: Initial HEP to be provided at visit 2 as appropriate (02/08/24); Goal status: In progress  2.  Patient will demonstrate improved Upper Extremity Functional Scale (UEFS) to equal or greater than 64/80 to demonstrate improvement in overall condition and self-reported functional ability.  Baseline: 34/80 (02/08/24); Goal status: In progress  3.  Patient will demonstrate pain free L UE strength equal or greater to R UE strength to improve his ability to complete his job duties.  Baseline: decreased and painful (02/08/24); Goal status: In progress  4.  Patient will demonstrate L shoulder AROM/PROM pain free and equal or greater  to R shoulder to improve his ability to reach overhead and move into awkward positions while performing his job duties and leisure activities such as basketball and going to the gym.  Baseline: limited in overhead movement and painful (02/08/24); Goal status: In progress  5.  Patient will demonstrate improvement in Patient Specific Functional Scale (PSFS) of equal or greater than 8/10 points to reflect clinically significant improvement in patient's most valued functional activities. Baseline: to be measured at visit 2 as appropriate (02/08/24); 1/10 (02/10/24);  Goal status: In progress  6.  Patient will report NPRS equal or less than 2/10 during functional activities during the last 2 weeks to  improve their abilitly to complete community, work and/or recreational activities with less limitation. Baseline: 8/10 (02/08/24); Goal status: In progress   PLAN:  PT FREQUENCY: 1-2x/week  PT DURATION: 12 weeks  PLANNED INTERVENTIONS: 97164- PT Re-evaluation, 97750- Physical Performance Testing, 97110-Therapeutic exercises, 97530- Therapeutic activity, [id number]- Neuromuscular re-education, 97535- Self Care, [id number]- Manual therapy, [id number]- Electrical stimulation (unattended), [phone]- Traction (mechanical), 20560 (1-2 muscles), 20561 (3+ muscles)- Dry Needling, Patient/Family education, Joint mobilization, Spinal mobilization, Cryotherapy, and Moist heat  PLAN FOR NEXT SESSION: update HEP as appropriate, activity modifications to accommodate mechanical stressors and allow healing, then motor control exercises to teach improved mechanics, progressive loading of the shoulder girdle, and reconditioning of the movements and tasks that previously irritated condition. Manual therapy as needed to restore motion and provide sympotm modulation.    Camie SAUNDERS. Juli, PT, DPT, Cert. MDT 03/14/24, 12:09 PM  [facility] [address] [place], [place] [address] P: [phone] I F: [phone]

## 2024-03-15 ENCOUNTER — Ambulatory Visit: Admitting: Physical Therapy

## 2024-03-16 ENCOUNTER — Ambulatory Visit: Admitting: Physical Therapy

## 2024-03-16 ENCOUNTER — Encounter: Payer: Self-pay | Admitting: Physical Therapy

## 2024-03-16 DIAGNOSIS — M25512 Pain in left shoulder: Secondary | ICD-10-CM

## 2024-03-16 DIAGNOSIS — M6281 Muscle weakness (generalized): Secondary | ICD-10-CM | POA: Diagnosis not present

## 2024-03-16 DIAGNOSIS — M792 Neuralgia and neuritis, unspecified: Secondary | ICD-10-CM | POA: Diagnosis not present

## 2024-03-16 NOTE — Therapy (Signed)
 OUTPATIENT PHYSICAL THERAPY TREATEMENT   Patient Name: Thomas Mckay MRN: [id number] DOB:11-08-1985, 38 y.o., male Today's Date: 03/16/2024  END OF SESSION:  PT End of Session - 03/16/24 1339     Visit Number 7    Number of Visits 17    Date for PT Re-Evaluation 05/02/24    Authorization Type [employer]  reporting period from 02/08/2024    Authorization Time Period 01/18/24-02/15/25  VL:max 30 PT vsts per plan year    Authorization - Visit Number 7    Authorization - Number of Visits 30    Progress Note Due on Visit 10    PT Start Time 1305    PT Stop Time 1343    PT Time Calculation (min) 38 min    Activity Tolerance Patient tolerated treatment well    Behavior During Therapy WFL for tasks assessed/performed                Past Medical History:  Diagnosis Date   Arthritis    wrists   History of chlamydia    History of herpes genitalis    Non Hodgkin's lymphoma (HCC)    in remission. completed treatments 08/2012   Seizures (HCC)    x1. as infant   Past Surgical History:  Procedure Laterality Date   LYMPH NODE BIOPSY     SHOULDER ARTHROSCOPY WITH LABRAL REPAIR Right 06/14/2017   Procedure: SHOULDER ARTHROSCOPY WITH LABRAL REPAIR and subacromial debridement.;  Surgeon: Marchia Drivers, MD;  Location: [facility];  Service: Orthopedics;  Laterality: Right;   WISDOM TOOTH EXTRACTION     Patient Active Problem List   Diagnosis Date Noted   Knee pain, bilateral 04/28/2022   Anxiety due to invasive procedure 02/19/2022   Degeneration of lumbar intervertebral disc 02/19/2022   Stiffness of right knee 07/03/2021   Cervical spine pain 04/02/2021   Cervical radiculopathy 04/02/2021   Cervical spondylosis 04/02/2021   Strain of neck muscle 04/02/2021   Right knee pain 12/20/2020   Research study patient 12/06/2019   Abdominal pain 02/22/2019   Proteinuria 02/22/2019   Asymptomatic microscopic hematuria 02/22/2019   Pain in joint involving ankle and foot  02/01/2019   Achilles bursitis 02/01/2019   Achilles tendinitis 02/01/2019   Hip pain 02/01/2019   Lumbar sprain 02/01/2019   Sprain of deltoid ligament of ankle 02/01/2019   Sprain of hand 02/01/2019   Pre-bariatric surgery nutrition evaluation 06/03/2017   Gastroesophageal reflux disease 05/24/2017   Type 2 HSV infection of penis 05/24/2017   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   Shoulder joint pain 05/14/2017   Non-Hodgkin lymphoma (HCC) 04/14/2017   Bilateral carpal tunnel syndrome 01/28/2017   Tear of right glenoid labrum 10/09/2014   Diffuse large B-cell lymphoma of lymph nodes of neck (HCC) 08/02/2014   History of antineoplastic chemotherapy 11/22/2012   History of radiation therapy 11/22/2012   Allergic rhinitis 08/18/2007    PCP: Claudene Tanda POUR, PA-C  REFERRING PROVIDER: Kathlynn Sharper, MD  REFERRING DIAG: Left rotator cuff tendinitis  THERAPY DIAG:  Acute pain of left shoulder  Neuralgia and neuritis  Muscle weakness (generalized)  Rationale for Evaluation and Treatment: Rehabilitation  ONSET DATE: June 2025  SUBJECTIVE:  PERTINENT HISTORY: Patient is a 38 y.o. male who presents to outpatient physical therapy with a referral for medical diagnosis left rotator cuff tendinitis. This patient's chief complaints consist of left shoulder pain and arm weakness with intermittent paresthesia to the hand, leading to the following functional deficits: difficulty with all activities that require use of L UE including reaching, carrying, lifting, restraining and holding animals, working, sleeping, housework, peparing food, grooming, pushing, driving, yardwork, dressing, using tools or appliances, laundry, throwing a ball, playing basket ball, going to the gym. Relevant past medical history and  comorbidities include the following: he has Pain in joint involving ankle and foot; Achilles bursitis; Achilles tendinitis; Allergic rhinitis; Bilateral carpal tunnel syndrome; Boutonniere deformity; Diffuse large B-cell lymphoma of lymph nodes of neck (HCC); Gastroesophageal reflux disease; Hip pain; History of antineoplastic chemotherapy; Lumbar sprain; Non-Hodgkin lymphoma (HCC); Sprain of deltoid ligament of ankle; Sprain of hand; Tear of right glenoid labrum; Type 2 HSV infection of penis; Shoulder joint pain; History of radiation therapy; Abdominal pain; Proteinuria; Asymptomatic microscopic hematuria; Pre-bariatric surgery nutrition evaluation; Research study patient; Right knee pain; Cervical spine pain; Cervical radiculopathy; Cervical spondylosis; Strain of neck muscle; Stiffness of right knee; Knee pain, bilateral;  and Degeneration of lumbar intervertebral disc on their problem list. he  has a past medical history of Arthritis, History of chlamydia, History of herpes genitalis, Non Hodgkin's lymphoma (HCC), and Seizures (as a baby). he  has a past surgical history that includes Wisdom tooth extraction; Lymph node biopsy; and Shoulder arthroscopy with labral repair (Right, 06/14/2017). Patient denies hx of stroke, lung problems, heart problems, diabetes, unexplained weight loss, unexplained changes in bowel or bladder problems, unexplained stumbling or dropping things, osteoporosis, and spinal surgery, or left shoulder surgery.   Exercise history: likes to go to the gym and play basketball. Might have a 5#DB. Also has curling bench press thing. He still has a Photographer.   Right handed  SUBJECTIVE STATEMENT: Patient states his shoulder is sore after trying to lift and restrain some animals at work. He states he felt real good after last PT session.   Top three problems: abduction of the left shoulder (pain and weakness)  PAIN: NPRS: Current: 3/10 L pec region and anterior deltoid  region  Best: 2-3/10, Worst: 8/10. Pain location: inside left shoulder, worst at anterior shoulder near pec, numbness over his whole left hand (currently there because he was poking the front of his shoulder).  Pain description: dull, Aggravating factors: the more he uses his left shoulder for anything weight wise including holding up kittens that are 2-3 lbs, reaching, sleeping on his left side (does not wake him), housework Relieving factors: as soon as he wakes up, hot bath   PRECAUTIONS: Other: don't lift anything over 10 lbs with left UE   PATIENT GOALS: back to normal hoping it ain't going to tear  NEXT MD VISIT: 5 weeks from last visit with Dr. Kathlynn   OBJECTIVE  Grip strength (in pounds, average of three measures, last tested 03/08/24).  R: (100+90+84)/3 = 91.3 L: (80+76+80)/ 3 = 78.7  DTR (last tested 02/24/24); Biceps: B 2+ Triceps: B 2+ Wrist extensors: R 1+, L 2+ Pronator: R 0+, L 1+ Finger flexors:  R: only fired a couple of times, L 2+  TREATMENT   Therapeutic activities: dynamic therapeutic activities incorporating MULTIPLE parameters or areas of the body designed to achieve improved functional performance.  BB bench press at rack 3x10 with 45#BB Limited ROM last 2 sets due to discomfort with bar close to chest  Seated lat pull down at [facility] machine to improve lower trap activation and decrease UT activation 1x10 with 45# cable 2x15 with 55# cable  Prone partial range of motion row on bench focused on intrascapular muscle training 2x10 with 5 second holds with 15# DB in each hand Chin resting on bench   Manual therapy: to reduce pain and tissue tension, improve range of motion, neuromodulation, in order to promote improved ability to complete functional activities. HOOKLYING STM to left pec major and minor, left upper trap, left  biceps, left anterior deltoid, left infraspinatus  Most discomfort at left pec major, but some at infraspinatus Cervical spine manual traction 10x10 seconds on/off  Neuromuscular Re-education: a technique or exercise performed with the goal of improving the level of communication between the body and the brain, such as for balance, motor control, muscle activation patterns, coordination, desensitization, quality of muscle contraction, proprioception, and/or kinesthetic sense needed for successful and safe completion of functional activities.   Standing isometric shoulder ER at neutral then 90 degrees 1x10 with 5 second holds each position using RTB loop Moderate cuing   Pt required multimodal cuing for proper technique and to facilitate improved neuromuscular control, strength, range of motion, and functional ability resulting in improved performance and form.    PATIENT EDUCATION: Education details: Exercise purpose/form. Self management techniques. Education on diagnosis, prognosis, POC, anatomy and physiology of current condition. Education on HEP including handout. Person educated: Patient Education method: Explanation Education comprehension: verbalized understanding and needs further education  HOME EXERCISE PROGRAM: Access Code: [email or web address] URL: [email or web address] Date: 03/07/2024 Prepared by: Camie Cleverly  Program Notes [email or web address]   Exercises - Lat Pull with band/cable  - 1 x daily - 2 sets - 10 reps - 5 seconds hold - Prone Shoulder External Rotation with Towel Roll  - 1 x daily - 3 sets - 15 reps  HOME EXERCISE PROGRAM [[email or web address]]  Prone B shoulder ER for lower traps -  Repeat 20 Repetitions, Hold 5 Seconds, Complete 2 Sets, Perform 1 Times a Day  ASSESSMENT:  CLINICAL IMPRESSION: Continued with manual therapy for pain control and shoulder girdle and postural strengthening to improve functional capacity and  motor control. Patient tolerated progressions well but has not yet returned to prior level of function. Patient would benefit from continued management of limiting condition by skilled physical therapist to address remaining impairments and functional limitations to work towards stated goals and return to PLOF or maximal functional independence.   From initial PT evaluation  on 02/08/24:  Patient is a 38 y.o. male referred to outpatient physical therapy with a medical diagnosis of left rotator cuff tendinitis who presents with signs and symptoms consistent with left shoulder pain and L UE weakness and paresthesia following an injury in June. He appears to have myotomal weakness at C5 and C6 that improves with cervical unloading thorough distraction, suggesting a cervical spine contribution to his condition. This would match with the has left sided upper trap discomfort he experiences with left cervical spine rotation, sidebending, and extension and the intermittent paresthesia to his hand. However, his DTRs appear equal and intact bilaterally. C5 myotomes tested today also correspond to the same motions  most commonly affected by a RTC condition (shoulder ER  and abduction). Plan to address RTC and shoulder as well as any cervical spine contributions. Patient has good motion B shoulders except some limited overhead movement in the L. Patient presents with significant pain, ROM, joint stiffness, muscle performance (strength/power/endurance), paresthesia, and activity tolerance impairments that are limiting ability to complete all activities that require use of L UE including reaching, carrying, lifting, restraining and holding animals, working, sleeping, housework, peparing food, grooming, pushing, driving, yardwork, dressing, using tools or appliances, laundry, throwing a ball, playing basket ball, going to the gym without difficulty. Patient will benefit from skilled physical therapy intervention to address  current body structure impairments and activity limitations to improve function and work towards goals set in current POC in order to return to prior level of function or maximal functional improvement.    OBJECTIVE IMPAIRMENTS: decreased activity tolerance, decreased endurance, decreased knowledge of condition, decreased ROM, decreased strength, impaired UE functional use, improper body mechanics, postural dysfunction, and pain.   ACTIVITY LIMITATIONS: carrying, lifting, sleeping, bathing, dressing, reach over head, hygiene/grooming, and caring for others  PARTICIPATION LIMITATIONS: meal prep, cleaning, laundry, interpersonal relationship, driving, shopping, community activity, occupation, and yard work  PERSONAL FACTORS: Profession and 3+ comorbidities:   Allergic rhinitis; Bilateral carpal tunnel syndrome; Boutonniere deformity; Diffuse large B-cell lymphoma of lymph nodes of neck (HCC); Gastroesophageal reflux disease; Hip pain; History of antineoplastic chemotherapy; Lumbar sprain; Non-Hodgkin lymphoma (HCC);  Tear of right glenoid labrum; Shoulder joint pain; History of radiation therapy;  Cervical spine pain; Cervical radiculopathy; Cervical spondylosis;  and Degeneration of lumbar intervertebral disc on their problem list. he  has a past medical history of Arthritis,  Non Hodgkin's lymphoma (HCC), and Seizures (as a baby). he  has a past surgical history that includes Wisdom tooth extraction; Lymph node biopsy; and Shoulder arthroscopy with labral repair (Right, 06/14/2017) are also affecting patient's functional outcome.   REHAB POTENTIAL: Good  CLINICAL DECISION MAKING: Evolving/moderate complexity  EVALUATION COMPLEXITY: Moderate   GOALS: Goals reviewed with patient? No  SHORT TERM GOALS: Target date: 02/22/2024  Patient will be independent with initial home exercise program for self-management of symptoms. Baseline: Initial HEP to be provided at visit 2 as appropriate  (02/08/24); Goal status: In progress   LONG TERM GOALS: Target date: 05/02/2024  Patient will be independent with a long-term home exercise program for self-management of symptoms.  Baseline: Initial HEP to be provided at visit 2 as appropriate (02/08/24); Goal status: In progress  2.  Patient will demonstrate improved Upper Extremity Functional Scale (UEFS) to equal or greater than 64/80 to demonstrate improvement in overall condition and self-reported functional ability.  Baseline: 34/80 (02/08/24); Goal status: In progress  3.  Patient will demonstrate pain free L UE strength equal or greater to R UE strength to improve his ability to complete his job duties.  Baseline: decreased and painful (02/08/24); Goal status: In progress  4.  Patient will demonstrate L shoulder AROM/PROM pain free and equal or greater to R shoulder to improve his ability to reach overhead and move into awkward positions while performing his job duties and leisure activities such as basketball and going to the gym.  Baseline: limited in overhead movement and painful (02/08/24); Goal status: In progress  5.  Patient will demonstrate improvement in Patient Specific Functional Scale (PSFS) of equal or greater than 8/10 points to reflect clinically significant improvement in patient's most valued functional activities. Baseline: to be measured at visit 2 as appropriate (02/08/24); 1/10 (02/10/24);  Goal status: In  progress  6.  Patient will report NPRS equal or less than 2/10 during functional activities during the last 2 weeks to improve their abilitly to complete community, work and/or recreational activities with less limitation. Baseline: 8/10 (02/08/24); Goal status: In progress   PLAN:  PT FREQUENCY: 1-2x/week  PT DURATION: 12 weeks  PLANNED INTERVENTIONS: 97164- PT Re-evaluation, 97750- Physical Performance Testing, 97110-Therapeutic exercises, 97530- Therapeutic activity, [id number]- Neuromuscular  re-education, 97535- Self Care, 02859- Manual therapy, [id number]- Electrical stimulation (unattended), [id number]- Traction (mechanical), 20560 (1-2 muscles), 20561 (3+ muscles)- Dry Needling, Patient/Family education, Joint mobilization, Spinal mobilization, Cryotherapy, and Moist heat  PLAN FOR NEXT SESSION: update HEP as appropriate, activity modifications to accommodate mechanical stressors and allow healing, then motor control exercises to teach improved mechanics, progressive loading of the shoulder girdle, and reconditioning of the movements and tasks that previously irritated condition. Manual therapy as needed to restore motion and provide sympotm modulation.    Camie SAUNDERS. Juli, PT, DPT, Cert. MDT 03/16/24, 1:43 PM  [facility] [address] [place], [place] [address] P: [phone] I F: [phone]

## 2024-03-17 ENCOUNTER — Ambulatory Visit
Admission: RE | Admit: 2024-03-17 | Discharge: 2024-03-17 | Disposition: A | Discharge status: Home / Self Care | Source: Ambulatory Visit | Attending: Orthopedic Surgery | Admitting: Orthopedic Surgery

## 2024-03-17 DIAGNOSIS — M7582 Other shoulder lesions, left shoulder: Secondary | ICD-10-CM | POA: Insufficient documentation

## 2024-03-17 DIAGNOSIS — M25512 Pain in left shoulder: Secondary | ICD-10-CM | POA: Diagnosis not present

## 2024-03-17 DIAGNOSIS — M129 Arthropathy, unspecified: Secondary | ICD-10-CM | POA: Diagnosis not present

## 2024-03-21 ENCOUNTER — Ambulatory Visit: Attending: Orthopedic Surgery | Admitting: Physical Therapy

## 2024-03-21 ENCOUNTER — Encounter: Payer: Self-pay | Admitting: Physical Therapy

## 2024-03-21 DIAGNOSIS — M6281 Muscle weakness (generalized): Secondary | ICD-10-CM | POA: Insufficient documentation

## 2024-03-21 DIAGNOSIS — M792 Neuralgia and neuritis, unspecified: Secondary | ICD-10-CM | POA: Insufficient documentation

## 2024-03-21 DIAGNOSIS — M25512 Pain in left shoulder: Secondary | ICD-10-CM | POA: Insufficient documentation

## 2024-03-21 NOTE — Therapy (Signed)
 OUTPATIENT PHYSICAL THERAPY TREATEMENT   Patient Name: Thomas Mckay MRN: [id number] DOB:July 01, 1986, 38 y.o., male Today's Date: 03/21/2024  END OF SESSION:  PT End of Session - 03/21/24 1352     Visit Number 8    Number of Visits 17    Date for PT Re-Evaluation 05/02/24    Authorization Type [employer]  reporting period from 02/08/2024    Authorization Time Period 01/18/24-02/15/25  VL:max 30 PT vsts per plan year    Authorization - Visit Number 8    Authorization - Number of Visits 30    Progress Note Due on Visit 10    PT Start Time 1351    PT Stop Time 1429    PT Time Calculation (min) 38 min    Activity Tolerance Patient tolerated treatment well    Behavior During Therapy WFL for tasks assessed/performed                 Past Medical History:  Diagnosis Date   Arthritis    wrists   History of chlamydia    History of herpes genitalis    Non Hodgkin's lymphoma (HCC)    in remission. completed treatments 08/2012   Seizures (HCC)    x1. as infant   Past Surgical History:  Procedure Laterality Date   LYMPH NODE BIOPSY     SHOULDER ARTHROSCOPY WITH LABRAL REPAIR Right 06/14/2017   Procedure: SHOULDER ARTHROSCOPY WITH LABRAL REPAIR and subacromial debridement.;  Surgeon: Marchia Drivers, MD;  Location: [facility];  Service: Orthopedics;  Laterality: Right;   WISDOM TOOTH EXTRACTION     Patient Active Problem List   Diagnosis Date Noted   Knee pain, bilateral 04/28/2022   Anxiety due to invasive procedure 02/19/2022   Degeneration of lumbar intervertebral disc 02/19/2022   Stiffness of right knee 07/03/2021   Cervical spine pain 04/02/2021   Cervical radiculopathy 04/02/2021   Cervical spondylosis 04/02/2021   Strain of neck muscle 04/02/2021   Right knee pain 12/20/2020   Research study patient 12/06/2019   Abdominal pain 02/22/2019   Proteinuria 02/22/2019   Asymptomatic microscopic hematuria 02/22/2019   Pain in joint involving ankle and foot  02/01/2019   Achilles bursitis 02/01/2019   Achilles tendinitis 02/01/2019   Hip pain 02/01/2019   Lumbar sprain 02/01/2019   Sprain of deltoid ligament of ankle 02/01/2019   Sprain of hand 02/01/2019   Pre-bariatric surgery nutrition evaluation 06/03/2017   Gastroesophageal reflux disease 05/24/2017   Type 2 HSV infection of penis 05/24/2017   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   Shoulder joint pain 05/14/2017   Non-Hodgkin lymphoma (HCC) 04/14/2017   Bilateral carpal tunnel syndrome 01/28/2017   Tear of right glenoid labrum 10/09/2014   Diffuse large B-cell lymphoma of lymph nodes of neck (HCC) 08/02/2014   History of antineoplastic chemotherapy 11/22/2012   History of radiation therapy 11/22/2012   Allergic rhinitis 08/18/2007    PCP: Claudene Tanda POUR, PA-C  REFERRING PROVIDER: Kathlynn Sharper, MD  REFERRING DIAG: Left rotator cuff tendinitis  THERAPY DIAG:  Acute pain of left shoulder  Neuralgia and neuritis  Muscle weakness (generalized)  Rationale for Evaluation and Treatment: Rehabilitation  ONSET DATE: June 2025  SUBJECTIVE:  PERTINENT HISTORY: Patient is a 38 y.o. male who presents to outpatient physical therapy with a referral for medical diagnosis left rotator cuff tendinitis. This patient's chief complaints consist of left shoulder pain and arm weakness with intermittent paresthesia to the hand, leading to the following functional deficits: difficulty with all activities that require use of L UE including reaching, carrying, lifting, restraining and holding animals, working, sleeping, housework, peparing food, grooming, pushing, driving, yardwork, dressing, using tools or appliances, laundry, throwing a ball, playing basket ball, going to the gym. Relevant past medical history and  comorbidities include the following: he has Pain in joint involving ankle and foot; Achilles bursitis; Achilles tendinitis; Allergic rhinitis; Bilateral carpal tunnel syndrome; Boutonniere deformity; Diffuse large B-cell lymphoma of lymph nodes of neck (HCC); Gastroesophageal reflux disease; Hip pain; History of antineoplastic chemotherapy; Lumbar sprain; Non-Hodgkin lymphoma (HCC); Sprain of deltoid ligament of ankle; Sprain of hand; Tear of right glenoid labrum; Type 2 HSV infection of penis; Shoulder joint pain; History of radiation therapy; Abdominal pain; Proteinuria; Asymptomatic microscopic hematuria; Pre-bariatric surgery nutrition evaluation; Research study patient; Right knee pain; Cervical spine pain; Cervical radiculopathy; Cervical spondylosis; Strain of neck muscle; Stiffness of right knee; Knee pain, bilateral;  and Degeneration of lumbar intervertebral disc on their problem list. he  has a past medical history of Arthritis, History of chlamydia, History of herpes genitalis, Non Hodgkin's lymphoma (HCC), and Seizures (as a baby). he  has a past surgical history that includes Wisdom tooth extraction; Lymph node biopsy; and Shoulder arthroscopy with labral repair (Right, 06/14/2017). Patient denies hx of stroke, lung problems, heart problems, diabetes, unexplained weight loss, unexplained changes in bowel or bladder problems, unexplained stumbling or dropping things, osteoporosis, and spinal surgery, or left shoulder surgery.   Exercise history: likes to go to the gym and play basketball. Might have a 5#DB. Also has curling bench press thing. He still has a Photographer.   Right handed  SUBJECTIVE STATEMENT: Patient states he has a headache over the right eye (has been there for 2 days), and pain in his left shoulder. He got his shoulder MRI results back but has not spoken to the doctor about it yet. He feels like his left shoulder is a little more sore after putting dogs in a head lock to  vaccinate them.   Top three problems: abduction of the left shoulder (pain and weakness)  PAIN: NPRS: Current: 3/10 L pec region and anterior deltoid region,  3/10 headache.   Best: 2-3/10, Worst: 8/10. Pain location: inside left shoulder, worst at anterior shoulder near pec, numbness over his whole left hand (currently there because he was poking the front of his shoulder).  Pain description: dull, Aggravating factors: the more he uses his left shoulder for anything weight wise including holding up kittens that are 2-3 lbs, reaching, sleeping on his left side (does not wake him), housework Relieving factors: as soon as he wakes up, hot bath   PRECAUTIONS: Other: don't lift anything over 10 lbs with left UE   PATIENT GOALS: back to normal hoping it ain't going to tear  NEXT MD VISIT: 5 weeks from last visit with Dr. Kathlynn   OBJECTIVE IMAGING L shoulder MRI report from 03/17/24:  CLINICAL DATA:  Left shoulder pain, injury 2 months ago   EXAM: MRI OF THE LEFT SHOULDER WITHOUT CONTRAST   TECHNIQUE: Multiplanar, multisequence MR imaging of the shoulder was performed. No intravenous contrast was administered.   COMPARISON:  None Available.   FINDINGS: Rotator  cuff: Supraspinatus tendon is intact. Mild infraspinatus tendinosis. Teres minor tendon is intact. Subscapularis tendon is intact.   Muscles: No muscle atrophy or edema. No intramuscular fluid collection or hematoma.   Biceps Long Head: Mild tendinosis of the intra-articular portion of the long head of the biceps tendon.   Acromioclavicular Joint: Mild arthropathy of the acromioclavicular joint. No subacromial/subdeltoid bursal fluid.   Glenohumeral Joint: No joint effusion. No chondral defect.   Labrum: Grossly intact, but evaluation is limited by lack of intraarticular fluid/contrast.   Bones: No fracture or dislocation. No aggressive osseous lesion.   Other: No fluid collection or hematoma.    IMPRESSION: 1. Mild infraspinatus tendinosis without a tear. 2. Mild tendinosis of the intra-articular portion of the long head of the biceps tendon.   Grip strength (in pounds, average of three measures, last tested 03/08/24).  R: (100+90+84)/3 = 91.3 L: (80+76+80)/ 3 = 78.7  DTR (last tested 02/24/24); Biceps: B 2+ Triceps: B 2+ Wrist extensors: R 1+, L 2+ Pronator: R 0+, L 1+ Finger flexors:  R: only fired a couple of times, L 2+                                                                                                                           TREATMENT   Therapeutic activities: dynamic therapeutic activities incorporating MULTIPLE parameters or areas of the body designed to achieve improved functional performance.  Seated lat pull down at [facility] machine to improve lower trap activation and decrease UT activation 3x15 with 45# cable 55# too heavy today (painful) Heavy cuing for full ROM retraction at the scapulae  BB bench press at rack 3x10 with 45#BB Limited ROM last 2 sets due to discomfort with bar close to chest  Superset:  Prone partial range of motion row on bench focused on intrascapular muscle training 2x10 with 5 second holds with 15# DB in each hand Chin resting on bench  Seated bicep curls slightly reclined on weight bench to offload neck  3x10 with 10#DB in each hand Head resting on bench Tightness in L biceps  Manual therapy: to reduce pain and tissue tension, improve range of motion, neuromodulation, in order to promote improved ability to complete functional activities. HOOKLYING STM to R > L suboccipitals, R UT (no effect on neck pain).  Cervical spine manual traction 10x10 seconds on/off  Neuromuscular Re-education: a technique or exercise performed with the goal of improving the level of communication between the body and the brain, such as for balance, motor control, muscle activation patterns, coordination, desensitization, quality of muscle  contraction, proprioception, and/or kinesthetic sense needed for successful and safe completion of functional activities.   Standing isometric shoulder ER at neutral then 90 degrees 1x10 with 5 second holds each position using RTB loop Moderate cuing   Pt required multimodal cuing for proper technique and to facilitate improved neuromuscular control, strength, range of motion, and functional ability resulting in improved performance and form.  PATIENT EDUCATION: Education details: Exercise purpose/form. Self management techniques. Education on diagnosis, prognosis, POC, anatomy and physiology of current condition. Education on imaging.  Person educated: Patient Education method: Explanation Education comprehension: verbalized understanding and needs further education  HOME EXERCISE PROGRAM: Access Code: [email or web address] URL: [email or web address] Date: 03/07/2024 Prepared by: Camie Cleverly  Program Notes [email or web address]   Exercises - Lat Pull with band/cable  - 1 x daily - 2 sets - 10 reps - 5 seconds hold - Prone Shoulder External Rotation with Towel Roll  - 1 x daily - 3 sets - 15 reps  HOME EXERCISE PROGRAM [[id number]]  Prone B shoulder ER for lower traps -  Repeat 20 Repetitions, Hold 5 Seconds, Complete 2 Sets, Perform 1 Times a Day  ASSESSMENT:  CLINICAL IMPRESSION: Patient with slightly more discomfort during exercises today but described as tightness not pain. Able to progress bench press slightly but had to regress lat pull some due to discomfort. Did not perform manual therapy over the soft tissues of the shoulder with less comfortable exercise afterwards and this might be a reason. Patient has not yet returned to prior level of function. Patient would benefit from continued management of limiting condition by skilled physical therapist to address remaining impairments and functional limitations to work towards stated goals  and return to PLOF or maximal functional independence.    From initial PT evaluation  on 02/08/24:  Patient is a 38 y.o. male referred to outpatient physical therapy with a medical diagnosis of left rotator cuff tendinitis who presents with signs and symptoms consistent with left shoulder pain and L UE weakness and paresthesia following an injury in June. He appears to have myotomal weakness at C5 and C6 that improves with cervical unloading thorough distraction, suggesting a cervical spine contribution to his condition. This would match with the has left sided upper trap discomfort he experiences with left cervical spine rotation, sidebending, and extension and the intermittent paresthesia to his hand. However, his DTRs appear equal and intact bilaterally. C5 myotomes tested today also correspond to the same motions  most commonly affected by a RTC condition (shoulder ER and abduction). Plan to address RTC and shoulder as well as any cervical spine contributions. Patient has good motion B shoulders except some limited overhead movement in the L. Patient presents with significant pain, ROM, joint stiffness, muscle performance (strength/power/endurance), paresthesia, and activity tolerance impairments that are limiting ability to complete all activities that require use of L UE including reaching, carrying, lifting, restraining and holding animals, working, sleeping, housework, peparing food, grooming, pushing, driving, yardwork, dressing, using tools or appliances, laundry, throwing a ball, playing basket ball, going to the gym without difficulty. Patient will benefit from skilled physical therapy intervention to address current body structure impairments and activity limitations to improve function and work towards goals set in current POC in order to return to prior level of function or maximal functional improvement.    OBJECTIVE IMPAIRMENTS: decreased activity tolerance, decreased endurance, decreased  knowledge of condition, decreased ROM, decreased strength, impaired UE functional use, improper body mechanics, postural dysfunction, and pain.   ACTIVITY LIMITATIONS: carrying, lifting, sleeping, bathing, dressing, reach over head, hygiene/grooming, and caring for others  PARTICIPATION LIMITATIONS: meal prep, cleaning, laundry, interpersonal relationship, driving, shopping, community activity, occupation, and yard work  PERSONAL FACTORS: Profession and 3+ comorbidities:   Allergic rhinitis; Bilateral carpal tunnel syndrome; Boutonniere deformity; Diffuse large B-cell lymphoma of lymph nodes of neck (HCC); Gastroesophageal reflux disease; Hip pain; History of antineoplastic chemotherapy; Lumbar  sprain; Non-Hodgkin lymphoma (HCC);  Tear of right glenoid labrum; Shoulder joint pain; History of radiation therapy;  Cervical spine pain; Cervical radiculopathy; Cervical spondylosis;  and Degeneration of lumbar intervertebral disc on their problem list. he  has a past medical history of Arthritis,  Non Hodgkin's lymphoma (HCC), and Seizures (as a baby). he  has a past surgical history that includes Wisdom tooth extraction; Lymph node biopsy; and Shoulder arthroscopy with labral repair (Right, 06/14/2017) are also affecting patient's functional outcome.   REHAB POTENTIAL: Good  CLINICAL DECISION MAKING: Evolving/moderate complexity  EVALUATION COMPLEXITY: Moderate   GOALS: Goals reviewed with patient? No  SHORT TERM GOALS: Target date: 02/22/2024  Patient will be independent with initial home exercise program for self-management of symptoms. Baseline: Initial HEP to be provided at visit 2 as appropriate (02/08/24); Goal status: In progress   LONG TERM GOALS: Target date: 05/02/2024  Patient will be independent with a long-term home exercise program for self-management of symptoms.  Baseline: Initial HEP to be provided at visit 2 as appropriate (02/08/24); Goal status: In progress  2.  Patient  will demonstrate improved Upper Extremity Functional Scale (UEFS) to equal or greater than 64/80 to demonstrate improvement in overall condition and self-reported functional ability.  Baseline: 34/80 (02/08/24); Goal status: In progress  3.  Patient will demonstrate pain free L UE strength equal or greater to R UE strength to improve his ability to complete his job duties.  Baseline: decreased and painful (02/08/24); Goal status: In progress  4.  Patient will demonstrate L shoulder AROM/PROM pain free and equal or greater to R shoulder to improve his ability to reach overhead and move into awkward positions while performing his job duties and leisure activities such as basketball and going to the gym.  Baseline: limited in overhead movement and painful (02/08/24); Goal status: In progress  5.  Patient will demonstrate improvement in Patient Specific Functional Scale (PSFS) of equal or greater than 8/10 points to reflect clinically significant improvement in patient's most valued functional activities. Baseline: to be measured at visit 2 as appropriate (02/08/24); 1/10 (02/10/24);  Goal status: In progress  6.  Patient will report NPRS equal or less than 2/10 during functional activities during the last 2 weeks to improve their abilitly to complete community, work and/or recreational activities with less limitation. Baseline: 8/10 (02/08/24); Goal status: In progress   PLAN:  PT FREQUENCY: 1-2x/week  PT DURATION: 12 weeks  PLANNED INTERVENTIONS: 97164- PT Re-evaluation, 97750- Physical Performance Testing, 97110-Therapeutic exercises, 97530- Therapeutic activity, [id number]- Neuromuscular re-education, 97535- Self Care, [id number]- Manual therapy, [id number]- Electrical stimulation (unattended), [phone]- Traction (mechanical), 20560 (1-2 muscles), 20561 (3+ muscles)- Dry Needling, Patient/Family education, Joint mobilization, Spinal mobilization, Cryotherapy, and Moist heat  PLAN FOR NEXT SESSION: update  HEP as appropriate, activity modifications to accommodate mechanical stressors and allow healing, then motor control exercises to teach improved mechanics, progressive loading of the shoulder girdle, and reconditioning of the movements and tasks that previously irritated condition. Manual therapy as needed to restore motion and provide sympotm modulation.    Camie SAUNDERS. Juli, PT, DPT, Cert. MDT 03/21/24, 2:31 PM  [facility] [address] [place], [place] [address] P: [phone] I F: [phone]

## 2024-03-23 ENCOUNTER — Ambulatory Visit: Admitting: Physical Therapy

## 2024-03-23 DIAGNOSIS — M25512 Pain in left shoulder: Secondary | ICD-10-CM | POA: Diagnosis not present

## 2024-03-23 DIAGNOSIS — M6281 Muscle weakness (generalized): Secondary | ICD-10-CM | POA: Diagnosis not present

## 2024-03-23 DIAGNOSIS — M792 Neuralgia and neuritis, unspecified: Secondary | ICD-10-CM | POA: Diagnosis not present

## 2024-03-23 NOTE — Therapy (Signed)
 OUTPATIENT PHYSICAL THERAPY TREATEMENT   Patient Name: Thomas Mckay MRN: [id number] DOB:03-31-1986, 38 y.o., male Today's Date: 03/23/2024  END OF SESSION:  PT End of Session - 03/23/24 1627     Visit Number 9    Number of Visits 17    Date for PT Re-Evaluation 05/02/24    Authorization Type [employer]  reporting period from 02/08/2024    Authorization Time Period 01/18/24-02/15/25  VL:max 30 PT vsts per plan year    Authorization - Visit Number 9    Authorization - Number of Visits 30    Progress Note Due on Visit 10    PT Start Time 1605    PT Stop Time 1643    PT Time Calculation (min) 38 min    Activity Tolerance Patient tolerated treatment well    Behavior During Therapy WFL for tasks assessed/performed                  Past Medical History:  Diagnosis Date   Arthritis    wrists   History of chlamydia    History of herpes genitalis    Non Hodgkin's lymphoma (HCC)    in remission. completed treatments 08/2012   Seizures (HCC)    x1. as infant   Past Surgical History:  Procedure Laterality Date   LYMPH NODE BIOPSY     SHOULDER ARTHROSCOPY WITH LABRAL REPAIR Right 06/14/2017   Procedure: SHOULDER ARTHROSCOPY WITH LABRAL REPAIR and subacromial debridement.;  Surgeon: Marchia Drivers, MD;  Location: [facility];  Service: Orthopedics;  Laterality: Right;   WISDOM TOOTH EXTRACTION     Patient Active Problem List   Diagnosis Date Noted   Knee pain, bilateral 04/28/2022   Anxiety due to invasive procedure 02/19/2022   Degeneration of lumbar intervertebral disc 02/19/2022   Stiffness of right knee 07/03/2021   Cervical spine pain 04/02/2021   Cervical radiculopathy 04/02/2021   Cervical spondylosis 04/02/2021   Strain of neck muscle 04/02/2021   Right knee pain 12/20/2020   Research study patient 12/06/2019   Abdominal pain 02/22/2019   Proteinuria 02/22/2019   Asymptomatic microscopic hematuria 02/22/2019   Pain in joint involving ankle and foot  02/01/2019   Achilles bursitis 02/01/2019   Achilles tendinitis 02/01/2019   Hip pain 02/01/2019   Lumbar sprain 02/01/2019   Sprain of deltoid ligament of ankle 02/01/2019   Sprain of hand 02/01/2019   Pre-bariatric surgery nutrition evaluation 06/03/2017   Gastroesophageal reflux disease 05/24/2017   Type 2 HSV infection of penis 05/24/2017   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   Shoulder joint pain 05/14/2017   Non-Hodgkin lymphoma (HCC) 04/14/2017   Bilateral carpal tunnel syndrome 01/28/2017   Tear of right glenoid labrum 10/09/2014   Diffuse large B-cell lymphoma of lymph nodes of neck (HCC) 08/02/2014   History of antineoplastic chemotherapy 11/22/2012   History of radiation therapy 11/22/2012   Allergic rhinitis 08/18/2007    PCP: Claudene Tanda POUR, PA-C  REFERRING PROVIDER: Kathlynn Sharper, MD  REFERRING DIAG: Left rotator cuff tendinitis  THERAPY DIAG:  Acute pain of left shoulder  Neuralgia and neuritis  Muscle weakness (generalized)  Rationale for Evaluation and Treatment: Rehabilitation  ONSET DATE: June 2025  SUBJECTIVE:  PERTINENT HISTORY: Patient is a 38 y.o. male who presents to outpatient physical therapy with a referral for medical diagnosis left rotator cuff tendinitis. This patient's chief complaints consist of left shoulder pain and arm weakness with intermittent paresthesia to the hand, leading to the following functional deficits: difficulty with all activities that require use of L UE including reaching, carrying, lifting, restraining and holding animals, working, sleeping, housework, peparing food, grooming, pushing, driving, yardwork, dressing, using tools or appliances, laundry, throwing a ball, playing basket ball, going to the gym. Relevant past medical history and  comorbidities include the following: he has Pain in joint involving ankle and foot; Achilles bursitis; Achilles tendinitis; Allergic rhinitis; Bilateral carpal tunnel syndrome; Boutonniere deformity; Diffuse large B-cell lymphoma of lymph nodes of neck (HCC); Gastroesophageal reflux disease; Hip pain; History of antineoplastic chemotherapy; Lumbar sprain; Non-Hodgkin lymphoma (HCC); Sprain of deltoid ligament of ankle; Sprain of hand; Tear of right glenoid labrum; Type 2 HSV infection of penis; Shoulder joint pain; History of radiation therapy; Abdominal pain; Proteinuria; Asymptomatic microscopic hematuria; Pre-bariatric surgery nutrition evaluation; Research study patient; Right knee pain; Cervical spine pain; Cervical radiculopathy; Cervical spondylosis; Strain of neck muscle; Stiffness of right knee; Knee pain, bilateral;  and Degeneration of lumbar intervertebral disc on their problem list. he  has a past medical history of Arthritis, History of chlamydia, History of herpes genitalis, Non Hodgkin's lymphoma (HCC), and Seizures (as a baby). he  has a past surgical history that includes Wisdom tooth extraction; Lymph node biopsy; and Shoulder arthroscopy with labral repair (Right, 06/14/2017). Patient denies hx of stroke, lung problems, heart problems, diabetes, unexplained weight loss, unexplained changes in bowel or bladder problems, unexplained stumbling or dropping things, osteoporosis, and spinal surgery, or left shoulder surgery.   Exercise history: likes to go to the gym and play basketball. Might have a 5#DB. Also has curling bench press thing. He still has a Photographer.   Right handed  SUBJECTIVE STATEMENT: Patient states he felt fine after last PT session. He states his L arm is a little better. Headache is still there.   Top three problems: abduction of the left shoulder (pain and weakness)  PAIN: NPRS: Current: 3/10 L pec region and anterior deltoid region, 3/10 headache  Best:  2-3/10, Worst: 8/10. Pain location: inside left shoulder, worst at anterior shoulder near pec, numbness over his whole left hand (currently there because he was poking the front of his shoulder).  Pain description: dull, Aggravating factors: the more he uses his left shoulder for anything weight wise including holding up kittens that are 2-3 lbs, reaching, sleeping on his left side (does not wake him), housework Relieving factors: as soon as he wakes up, hot bath   PRECAUTIONS: Other: don't lift anything over 10 lbs with left UE   PATIENT GOALS: back to normal hoping it ain't going to tear  NEXT MD VISIT: 5 weeks from last visit with Dr. Kathlynn   OBJECTIVE  Grip strength (in pounds, average of three measures, last tested 03/08/24).  R: (100+90+84)/3 = 91.3 L: (80+76+80)/ 3 = 78.7  DTR (last tested 02/24/24); Biceps: B 2+ Triceps: B 2+ Wrist extensors: R 1+, L 2+ Pronator: R 0+, L 1+ Finger flexors:  R: only fired a couple of times, L 2+  TREATMENT  Manual therapy: to reduce pain and tissue tension, improve range of motion, neuromodulation, in order to promote improved ability to complete functional activities. SUPINE Cervical spine manual traction  10x10 seconds on/off STM left pec major   Therapeutic activities: dynamic therapeutic activities incorporating MULTIPLE parameters or areas of the body designed to achieve improved functional performance.  BB bench press at rack 1x10 with 45#BB 1x10 at 65# 3x10 at 85#  Seated lat pull down at [facility] machine to improve lower trap activation and decrease UT activation 1x10 with 45# cable 1x10 with 55# 1x10 with 65# 1x10 with 75# (poor form due to fatigue).  Superset:  Prone W 1x10 with 5# DB (too heavy) Chin resting on bench  Seated bicep curls slightly reclined on weight bench to offload neck  1x10  with 10#DB in each hand 1x10 with 15#DB in each hand Head resting on bench  Pt required multimodal cuing for proper technique and to facilitate improved neuromuscular control, strength, range of motion, and functional ability resulting in improved performance and form.    PATIENT EDUCATION: Education details: Exercise purpose/form. Self management techniques. Education on diagnosis, prognosis, POC, anatomy and physiology of current condition. Education on imaging.  Person educated: Patient Education method: Explanation Education comprehension: verbalized understanding and needs further education  HOME EXERCISE PROGRAM: Access Code: [email or web address] URL: [email or web address] Date: 03/07/2024 Prepared by: Camie Cleverly  Program Notes [email or web address]   Exercises - Lat Pull with band/cable  - 1 x daily - 2 sets - 10 reps - 5 seconds hold - Prone Shoulder External Rotation with Towel Roll  - 1 x daily - 3 sets - 15 reps  HOME EXERCISE PROGRAM [[id number]]  Prone B shoulder ER for lower traps -  Repeat 20 Repetitions, Hold 5 Seconds, Complete 2 Sets, Perform 1 Times a Day  ASSESSMENT:  CLINICAL IMPRESSION: Patient able to significantly progress load today with good form. Continues to have discomfort in left pec region but not worse with exercise. Patient would benefit from continued management of limiting condition by skilled physical therapist to address remaining impairments and functional limitations to work towards stated goals and return to PLOF or maximal functional independence.   From initial PT evaluation  on 02/08/24:  Patient is a 38 y.o. male referred to outpatient physical therapy with a medical diagnosis of left rotator cuff tendinitis who presents with signs and symptoms consistent with left shoulder pain and L UE weakness and paresthesia following an injury in June. He appears to have myotomal weakness at C5 and C6 that  improves with cervical unloading thorough distraction, suggesting a cervical spine contribution to his condition. This would match with the has left sided upper trap discomfort he experiences with left cervical spine rotation, sidebending, and extension and the intermittent paresthesia to his hand. However, his DTRs appear equal and intact bilaterally. C5 myotomes tested today also correspond to the same motions  most commonly affected by a RTC condition (shoulder ER and abduction). Plan to address RTC and shoulder as well as any cervical spine contributions. Patient has good motion B shoulders except some limited overhead movement in the L. Patient presents with significant pain, ROM, joint stiffness, muscle performance (strength/power/endurance), paresthesia, and activity tolerance impairments that are limiting ability to complete all activities that require use of L UE including reaching, carrying, lifting, restraining and holding animals, working, sleeping, housework, peparing food, grooming, pushing, driving, yardwork, dressing, using tools or appliances, laundry, throwing a ball, playing basket ball, going to the gym without  difficulty. Patient will benefit from skilled physical therapy intervention to address current body structure impairments and activity limitations to improve function and work towards goals set in current POC in order to return to prior level of function or maximal functional improvement.    OBJECTIVE IMPAIRMENTS: decreased activity tolerance, decreased endurance, decreased knowledge of condition, decreased ROM, decreased strength, impaired UE functional use, improper body mechanics, postural dysfunction, and pain.   ACTIVITY LIMITATIONS: carrying, lifting, sleeping, bathing, dressing, reach over head, hygiene/grooming, and caring for others  PARTICIPATION LIMITATIONS: meal prep, cleaning, laundry, interpersonal relationship, driving, shopping, community activity, occupation, and  yard work  PERSONAL FACTORS: Profession and 3+ comorbidities:   Allergic rhinitis; Bilateral carpal tunnel syndrome; Boutonniere deformity; Diffuse large B-cell lymphoma of lymph nodes of neck (HCC); Gastroesophageal reflux disease; Hip pain; History of antineoplastic chemotherapy; Lumbar sprain; Non-Hodgkin lymphoma (HCC);  Tear of right glenoid labrum; Shoulder joint pain; History of radiation therapy;  Cervical spine pain; Cervical radiculopathy; Cervical spondylosis;  and Degeneration of lumbar intervertebral disc on their problem list. he  has a past medical history of Arthritis,  Non Hodgkin's lymphoma (HCC), and Seizures (as a baby). he  has a past surgical history that includes Wisdom tooth extraction; Lymph node biopsy; and Shoulder arthroscopy with labral repair (Right, 06/14/2017) are also affecting patient's functional outcome.   REHAB POTENTIAL: Good  CLINICAL DECISION MAKING: Evolving/moderate complexity  EVALUATION COMPLEXITY: Moderate   GOALS: Goals reviewed with patient? No  SHORT TERM GOALS: Target date: 02/22/2024  Patient will be independent with initial home exercise program for self-management of symptoms. Baseline: Initial HEP to be provided at visit 2 as appropriate (02/08/24); Goal status: In progress   LONG TERM GOALS: Target date: 05/02/2024  Patient will be independent with a long-term home exercise program for self-management of symptoms.  Baseline: Initial HEP to be provided at visit 2 as appropriate (02/08/24); Goal status: In progress  2.  Patient will demonstrate improved Upper Extremity Functional Scale (UEFS) to equal or greater than 64/80 to demonstrate improvement in overall condition and self-reported functional ability.  Baseline: 34/80 (02/08/24); Goal status: In progress  3.  Patient will demonstrate pain free L UE strength equal or greater to R UE strength to improve his ability to complete his job duties.  Baseline: decreased and painful  (02/08/24); Goal status: In progress  4.  Patient will demonstrate L shoulder AROM/PROM pain free and equal or greater to R shoulder to improve his ability to reach overhead and move into awkward positions while performing his job duties and leisure activities such as basketball and going to the gym.  Baseline: limited in overhead movement and painful (02/08/24); Goal status: In progress  5.  Patient will demonstrate improvement in Patient Specific Functional Scale (PSFS) of equal or greater than 8/10 points to reflect clinically significant improvement in patient's most valued functional activities. Baseline: to be measured at visit 2 as appropriate (02/08/24); 1/10 (02/10/24);  Goal status: In progress  6.  Patient will report NPRS equal or less than 2/10 during functional activities during the last 2 weeks to improve their abilitly to complete community, work and/or recreational activities with less limitation. Baseline: 8/10 (02/08/24); Goal status: In progress   PLAN:  PT FREQUENCY: 1-2x/week  PT DURATION: 12 weeks  PLANNED INTERVENTIONS: 97164- PT Re-evaluation, 97750- Physical Performance Testing, 97110-Therapeutic exercises, 97530- Therapeutic activity, [id number]- Neuromuscular re-education, 97535- Self Care, 02859- Manual therapy, G0283- Electrical stimulation (unattended), 02987- Traction (mechanical), 20560 (1-2 muscles), 20561 (3+ muscles)- Dry Needling,  Patient/Family education, Joint mobilization, Spinal mobilization, Cryotherapy, and Moist heat  PLAN FOR NEXT SESSION: update HEP as appropriate, activity modifications to accommodate mechanical stressors and allow healing, then motor control exercises to teach improved mechanics, progressive loading of the shoulder girdle, and reconditioning of the movements and tasks that previously irritated condition. Manual therapy as needed to restore motion and provide sympotm modulation.    Camie SAUNDERS. Juli, PT, DPT, Cert. MDT 03/23/24, 7:08  PM  [facility] [address] [place], [place] [address] P: [phone] I F: [phone]

## 2024-03-27 ENCOUNTER — Encounter: Payer: Self-pay | Admitting: Physician Assistant

## 2024-03-27 ENCOUNTER — Ambulatory Visit: Payer: Self-pay | Admitting: Physician Assistant

## 2024-03-27 VITALS — BP 130/93 | HR 76 | Temp 97.7°F | Resp 14

## 2024-03-27 DIAGNOSIS — J0100 Acute maxillary sinusitis, unspecified: Secondary | ICD-10-CM

## 2024-03-27 MED ORDER — AMOXICILLIN 875 MG PO TABS: 875.0000 mg | ORAL_TABLET | Freq: Two times a day (BID) | ORAL | 0 refills | Status: AC

## 2024-03-27 MED ORDER — FEXOFENADINE-PSEUDOEPHED ER 60-120 MG PO TB12: 1.0000 | ORAL_TABLET | Freq: Two times a day (BID) | ORAL | 0 refills | Status: AC

## 2024-03-27 MED ORDER — NAPROXEN 500 MG PO TABS: 500.0000 mg | ORAL_TABLET | Freq: Two times a day (BID) | ORAL | 0 refills | Status: AC

## 2024-03-27 NOTE — Progress Notes (Signed)
 Headache started last weekend (03/18/2024).  C/O light sensitivity & right eye discomfort.  Denied all other respiratory S/Sx.  States he's been intermittently taking ibuprofen  800 mg with minimal relief.  Called Friday requesting appointment for evaluation & scheduled to see Renay Claudene RIGGERS today.  States headached continued all weekend.  States continued taking Ibuprofen  800 mg bid.  Describes headache as constant pressure, especially near right eye, like someone punched him in the eye.  Still says it's light sensitive.  Rates pain a 3 on scale of 0-10.  States pain changes. States pain is the worst when he goes outside or rooms with bright lights and loud noise like dogs barking.  Periods during the day he turns the lights out.  Denies nausea or vomiting.  When at home has not gone to a dark room, shut the door & try to rest by himself.  States this is the first time he's ever experienced a headache that has lasted this long & experienced the light sensitivity & loud noise sensitivity (like dogs barking).

## 2024-03-27 NOTE — Progress Notes (Signed)
   Subjective: Sinus congestion and headache    Patient ID: Thomas Mckay, male    DOB: [birth year], 38 y.o.   MRN: [id number]  HPI Patient states greater than 1 week of facial pain on the right side, headaches, and photophobia right eye.  Denies nausea, vomiting, diarrhea.  Denies vertigo.  Denies weakness.  Described facial pain as pressure.  Describes headache as throbbing.  No relief over-the-counter medications.   Review of Systems Allergic rhinitis and GERD.    Objective:   Physical Exam P 130/93  BP Location Left Arm  Patient Position Sitting  Cuff Size Large  Pulse Rate 76  Temp 97.7 F (36.5 C)  Temp Source Temporal  Resp 14  SpO2 95 %  HEENT is remarkable right maxillary guarding with palpation.  Bilaterally tenderness nasal turbinates. Neck is supple without lymphadenopathy or bruits. Lungs are clear to auscultation. Heart regular rate and rhythm.       Assessment & Plan: Subacute maxillary sinusitis  Patient given a prescription for amoxicillin , Allegra-D, and naproxen .  Patient advised to follow-up 1 week if no improvement or worsening complaint.

## 2024-03-29 ENCOUNTER — Ambulatory Visit: Admitting: Physical Therapy

## 2024-03-29 ENCOUNTER — Encounter: Payer: Self-pay | Admitting: Physical Therapy

## 2024-03-29 DIAGNOSIS — M792 Neuralgia and neuritis, unspecified: Secondary | ICD-10-CM | POA: Diagnosis not present

## 2024-03-29 DIAGNOSIS — M25512 Pain in left shoulder: Secondary | ICD-10-CM | POA: Diagnosis not present

## 2024-03-29 DIAGNOSIS — M6281 Muscle weakness (generalized): Secondary | ICD-10-CM

## 2024-03-29 NOTE — Therapy (Unsigned)
 OUTPATIENT PHYSICAL THERAPY TREATEMENT   Patient Name: Thomas Mckay MRN: [id number] DOB:1985-11-06, 38 y.o., male Today's Date: 03/30/2024  END OF SESSION:  PT End of Session - 03/29/24 1654     Visit Number 10    Number of Visits 17    Date for PT Re-Evaluation 05/02/24    Authorization Type [employer]  reporting period from 02/08/2024    Authorization Time Period 01/18/24-02/15/25  VL:max 30 PT vsts per plan year    Authorization - Visit Number 10    Authorization - Number of Visits 30    Progress Note Due on Visit 10    PT Start Time 1652    PT Stop Time 1731    PT Time Calculation (min) 39 min    Activity Tolerance Patient tolerated treatment well    Behavior During Therapy WFL for tasks assessed/performed                   Past Medical History:  Diagnosis Date   Arthritis    wrists   History of chlamydia    History of herpes genitalis    Non Hodgkin's lymphoma (HCC)    in remission. completed treatments 08/2012   Seizures (HCC)    x1. as infant   Past Surgical History:  Procedure Laterality Date   LYMPH NODE BIOPSY     SHOULDER ARTHROSCOPY WITH LABRAL REPAIR Right 06/14/2017   Procedure: SHOULDER ARTHROSCOPY WITH LABRAL REPAIR and subacromial debridement.;  Surgeon: Marchia Drivers, MD;  Location: [facility];  Service: Orthopedics;  Laterality: Right;   WISDOM TOOTH EXTRACTION     Patient Active Problem List   Diagnosis Date Noted   Knee pain, bilateral 04/28/2022   Anxiety due to invasive procedure 02/19/2022   Degeneration of lumbar intervertebral disc 02/19/2022   Stiffness of right knee 07/03/2021   Cervical spine pain 04/02/2021   Cervical radiculopathy 04/02/2021   Cervical spondylosis 04/02/2021   Strain of neck muscle 04/02/2021   Right knee pain 12/20/2020   Research study patient 12/06/2019   Abdominal pain 02/22/2019   Proteinuria 02/22/2019   Asymptomatic microscopic hematuria 02/22/2019   Pain in joint involving ankle and foot  02/01/2019   Achilles bursitis 02/01/2019   Achilles tendinitis 02/01/2019   Hip pain 02/01/2019   Lumbar sprain 02/01/2019   Sprain of deltoid ligament of ankle 02/01/2019   Sprain of hand 02/01/2019   Pre-bariatric surgery nutrition evaluation 06/03/2017   Gastroesophageal reflux disease 05/24/2017   Type 2 HSV infection of penis 05/24/2017   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   Shoulder joint pain 05/14/2017   Non-Hodgkin lymphoma (HCC) 04/14/2017   Bilateral carpal tunnel syndrome 01/28/2017   Tear of right glenoid labrum 10/09/2014   Diffuse large B-cell lymphoma of lymph nodes of neck (HCC) 08/02/2014   History of antineoplastic chemotherapy 11/22/2012   History of radiation therapy 11/22/2012   Allergic rhinitis 08/18/2007    PCP: Claudene Tanda POUR, PA-C  REFERRING PROVIDER: Kathlynn Sharper, MD  REFERRING DIAG: Left rotator cuff tendinitis  THERAPY DIAG:  Acute pain of left shoulder  Neuralgia and neuritis  Muscle weakness (generalized)  Rationale for Evaluation and Treatment: Rehabilitation  ONSET DATE: June 2025  SUBJECTIVE:  PERTINENT HISTORY: Patient is a 38 y.o. male who presents to outpatient physical therapy with a referral for medical diagnosis left rotator cuff tendinitis. This patient's chief complaints consist of left shoulder pain and arm weakness with intermittent paresthesia to the hand, leading to the following functional deficits: difficulty with all activities that require use of L UE including reaching, carrying, lifting, restraining and holding animals, working, sleeping, housework, peparing food, grooming, pushing, driving, yardwork, dressing, using tools or appliances, laundry, throwing a ball, playing basket ball, going to the gym. Relevant past medical history and  comorbidities include the following: he has Pain in joint involving ankle and foot; Achilles bursitis; Achilles tendinitis; Allergic rhinitis; Bilateral carpal tunnel syndrome; Boutonniere deformity; Diffuse large B-cell lymphoma of lymph nodes of neck (HCC); Gastroesophageal reflux disease; Hip pain; History of antineoplastic chemotherapy; Lumbar sprain; Non-Hodgkin lymphoma (HCC); Sprain of deltoid ligament of ankle; Sprain of hand; Tear of right glenoid labrum; Type 2 HSV infection of penis; Shoulder joint pain; History of radiation therapy; Abdominal pain; Proteinuria; Asymptomatic microscopic hematuria; Pre-bariatric surgery nutrition evaluation; Research study patient; Right knee pain; Cervical spine pain; Cervical radiculopathy; Cervical spondylosis; Strain of neck muscle; Stiffness of right knee; Knee pain, bilateral;  and Degeneration of lumbar intervertebral disc on their problem list. he  has a past medical history of Arthritis, History of chlamydia, History of herpes genitalis, Non Hodgkin's lymphoma (HCC), and Seizures (as a baby). he  has a past surgical history that includes Wisdom tooth extraction; Lymph node biopsy; and Shoulder arthroscopy with labral repair (Right, 06/14/2017). Patient denies hx of stroke, lung problems, heart problems, diabetes, unexplained weight loss, unexplained changes in bowel or bladder problems, unexplained stumbling or dropping things, osteoporosis, and spinal surgery, or left shoulder surgery.   Exercise history: likes to go to the gym and play basketball. Might have a 5#DB. Also has curling bench press thing. He still has a Photographer.   Right handed  SUBJECTIVE STATEMENT: Patient states his left shoulder is getting better. He states it hurts a little today. He went to the doctor Monday for his headache and was told it was due to sinuses. The headache is also getting better.   Top three problems: abduction of the left shoulder (pain and  weakness)  PAIN: NPRS: Current: 2/10 L pec region and anterior deltoid region, 2/10 headache  Best: 2-3/10, Worst: 8/10. Pain location: inside left shoulder, worst at anterior shoulder near pec, numbness over his whole left hand (currently there because he was poking the front of his shoulder).  Pain description: dull, Aggravating factors: the more he uses his left shoulder for anything weight wise including holding up kittens that are 2-3 lbs, reaching, sleeping on his left side (does not wake him), housework Relieving factors: as soon as he wakes up, hot bath   PRECAUTIONS: Other: don't lift anything over 10 lbs with left UE   PATIENT GOALS: back to normal hoping it ain't going to tear  NEXT MD VISIT: 5 weeks from last visit with Dr. Kathlynn   OBJECTIVE  SELF-REPORTED FUNCTION Upper Extremity Functional Scale (UEFS): 44 (range 0-80)  PERIPHERAL JOINT ROM Shoulder  Flexion R:  AROM: 165 PROM: > 180 painful End feel: empty L: AROM: 166 PROM: 165 concordant pain top of shoulder End feel: empty Abduction R: AROM: 135 pain PROM: 153 pain  End feel: stiff L: AROM: 135 PROM: 175 concordant pain top of shoulder End feel: empty Extension R: AROM: 68 L: AROM: 45 External rotation R:  AROM:  75 pain  PROM: 90 End feel: springy L: AROM: 70 PROM: 90 concordant pain End feel: springy Functional Internal rotation R: AROM: T7 PROM: 57 painful End feel: stiff L: AROM: T9 PROM: 43 concordant painful  End feel: stiff  MUSCLE PERFORMANCE/MYOTOMES (MMTx3 each): *Indicates pain 02/08/24 03/29/24 Date  Joint/Motion R/L R/L R/L  Shoulder        Flexion / 5/4+ /  Abduction (C5) 5/4* 5/4+* /  External rotation (C5) 5/4* 5/5* /  Internal rotation / / /  Extension / / /  Periscapular        Upper Trap / / /  Middle Trap / / /  Lower Trap / / /  Elbow        Flexion (C6) 5/4* 5/4* /  Extension (C7) 4/4+* 4+*/4+* /  Wrist        Flexion (C7) /      Extension  (C6) 4/4 5/5 /  Hand        MCP extension (C8) 4/4 4/4 /  Pinky finger abduction (T1) 4/4 4/4 /  Finger adduction (C8) 4/4 5/5 /  Comments:  02/08/24: improved strength where tested (Shoulder ER) when placed supine and with cervical spine distraction.   Grip strength (in pounds, average of three measures, last tested 03/29/24).  R: (90+86+100)/3 = 92 L: (70+83+98)/3 = 83.7  NEUROLOGICAL Traction alleviation: negative in sitting  DTR (last tested 02/24/24); Biceps: B 2+ Triceps: B 2+ Wrist extensors: R 1+, L 2+ Pronator: R 0+, L 1+ Finger flexors:  R: only fired a couple of times, L 2+                                                                                                                            TREATMENT  Therapeutic exercise: therapeutic exercises that incorporate ONE parameter at one or more areas of the body to centralize symptoms, develop strength and endurance, range of motion, and flexibility required for successful completion of functional activities. AROM and MMT to assess progress (see above)  Manual therapy: to reduce pain and tissue tension, improve range of motion, neuromodulation, in order to promote improved ability to complete functional activities. PROM with end feel assessment to assess progress (see above) Traction alleviation test (see above)   Pt required multimodal cuing for proper technique and to facilitate improved neuromuscular control, strength, range of motion, and functional ability resulting in improved performance and form.    PATIENT EDUCATION: Education details: Exercise purpose/form. Self management techniques. Education on diagnosis, prognosis, POC, anatomy and physiology of current condition. Education on imaging.  Person educated: Patient Education method: Explanation Education comprehension: verbalized understanding and needs further education  HOME EXERCISE PROGRAM: Access Code: [email or web address] URL:  [email or web address] Date: 03/07/2024 Prepared by: Camie Cleverly  Program Notes [email or web address]   Exercises - Lat Pull with band/cable  - 1 x daily - 2 sets - 10 reps - 5 seconds hold - Prone Shoulder External Rotation with Towel Roll  -  1 x daily - 3 sets - 15 reps  HOME EXERCISE PROGRAM [[id number]]  Prone B shoulder ER for lower traps -  Repeat 20 Repetitions, Hold 5 Seconds, Complete 2 Sets, Perform 1 Times a Day  ASSESSMENT:  CLINICAL IMPRESSION: Patient has attended 10 physical therapy sessions since starting current eipsode of care on 02/08/2024. He reports participating in his HEP but has not received final long term HEP. He has some improvements in left UE strength but continues to have pain and has not returned to prior level of function. L shoulder AROM is equal or greater than L shoulder AROM except extension and ER and he has pain on the left more often than the right. PROM demonstrates painful end range movement for all motions on the left. He has a history of R RTC reconstruction that is likely contributing to pain and decreased ROM on that side. Patient's pain with functional activities has decreased and he reports improved function at work, but not back to prior level of function. Plan to continue PT to continue working towards patient's goals. Patient would benefit from continued management of limiting condition by skilled physical therapist to address remaining impairments and functional limitations to work towards stated goals and return to PLOF or maximal functional independence.   From initial PT evaluation  on 02/08/24:  Patient is a 38 y.o. male referred to outpatient physical therapy with a medical diagnosis of left rotator cuff tendinitis who presents with signs and symptoms consistent with left shoulder pain and L UE weakness and paresthesia following an injury in June. He appears to have myotomal weakness at C5 and C6  that improves with cervical unloading thorough distraction, suggesting a cervical spine contribution to his condition. This would match with the has left sided upper trap discomfort he experiences with left cervical spine rotation, sidebending, and extension and the intermittent paresthesia to his hand. However, his DTRs appear equal and intact bilaterally. C5 myotomes tested today also correspond to the same motions  most commonly affected by a RTC condition (shoulder ER and abduction). Plan to address RTC and shoulder as well as any cervical spine contributions. Patient has good motion B shoulders except some limited overhead movement in the L. Patient presents with significant pain, ROM, joint stiffness, muscle performance (strength/power/endurance), paresthesia, and activity tolerance impairments that are limiting ability to complete all activities that require use of L UE including reaching, carrying, lifting, restraining and holding animals, working, sleeping, housework, peparing food, grooming, pushing, driving, yardwork, dressing, using tools or appliances, laundry, throwing a ball, playing basket ball, going to the gym without difficulty. Patient will benefit from skilled physical therapy intervention to address current body structure impairments and activity limitations to improve function and work towards goals set in current POC in order to return to prior level of function or maximal functional improvement.    OBJECTIVE IMPAIRMENTS: decreased activity tolerance, decreased endurance, decreased knowledge of condition, decreased ROM, decreased strength, impaired UE functional use, improper body mechanics, postural dysfunction, and pain.   ACTIVITY LIMITATIONS: carrying, lifting, sleeping, bathing, dressing, reach over head, hygiene/grooming, and caring for others  PARTICIPATION LIMITATIONS: meal prep, cleaning, laundry, interpersonal relationship, driving, shopping, community activity, occupation,  and yard work  PERSONAL FACTORS: Profession and 3+ comorbidities:   Allergic rhinitis; Bilateral carpal tunnel syndrome; Boutonniere deformity; Diffuse large B-cell lymphoma of lymph nodes of neck (HCC); Gastroesophageal reflux disease; Hip pain; History of antineoplastic chemotherapy; Lumbar sprain; Non-Hodgkin lymphoma (HCC);  Tear of right glenoid labrum; Shoulder joint  pain; History of radiation therapy;  Cervical spine pain; Cervical radiculopathy; Cervical spondylosis;  and Degeneration of lumbar intervertebral disc on their problem list. he  has a past medical history of Arthritis,  Non Hodgkin's lymphoma (HCC), and Seizures (as a baby). he  has a past surgical history that includes Wisdom tooth extraction; Lymph node biopsy; and Shoulder arthroscopy with labral repair (Right, 06/14/2017) are also affecting patient's functional outcome.   REHAB POTENTIAL: Good  CLINICAL DECISION MAKING: Evolving/moderate complexity  EVALUATION COMPLEXITY: Moderate   GOALS: Goals reviewed with patient? No  SHORT TERM GOALS: Target date: 02/22/2024  Patient will be independent with initial home exercise program for self-management of symptoms. Baseline: Initial HEP to be provided at visit 2 as appropriate (02/08/24); Goal status: MET   LONG TERM GOALS: Target date: 05/02/2024  Patient will be independent with a long-term home exercise program for self-management of symptoms.  Baseline: Initial HEP to be provided at visit 2 as appropriate (02/08/24); completes HEP daily (03/29/2024);  Goal status: In progress  2.  Patient will demonstrate improved Upper Extremity Functional Scale (UEFS) to equal or greater than 64/80 to demonstrate improvement in overall condition and self-reported functional ability.  Baseline: 34/80 (02/08/24); Goal status: In progress  3.  Patient will demonstrate pain free L UE strength equal or greater to R UE strength to improve his ability to complete his job duties.   Baseline: decreased and painful (02/08/24); Goal status: In progress  4.  Patient will demonstrate L shoulder AROM/PROM pain free and equal or greater to R shoulder to improve his ability to reach overhead and move into awkward positions while performing his job duties and leisure activities such as basketball and going to the gym.  Baseline: limited in overhead movement and painful (02/08/24); Goal status: In progress  5.  Patient will demonstrate improvement in Patient Specific Functional Scale (PSFS) of equal or greater than 8/10 points to reflect clinically significant improvement in patient's most valued functional activities. Baseline: to be measured at visit 2 as appropriate (02/08/24); 1/10 (02/10/24); did not capture (03/29/24); Goal status: In progress  6.  Patient will report NPRS equal or less than 2/10 during functional activities during the last 2 weeks to improve their abilitly to complete community, work and/or recreational activities with less limitation. Baseline: 8/10 (02/08/24); 5/10 (03/20/2024);  Goal status: In progress   PLAN:  PT FREQUENCY: 1-2x/week  PT DURATION: 12 weeks  PLANNED INTERVENTIONS: 97164- PT Re-evaluation, 97750- Physical Performance Testing, 97110-Therapeutic exercises, 97530- Therapeutic activity, [id number]- Neuromuscular re-education, 97535- Self Care, [id number]- Manual therapy, [id number]- Electrical stimulation (unattended), [phone]- Traction (mechanical), 20560 (1-2 muscles), 20561 (3+ muscles)- Dry Needling, Patient/Family education, Joint mobilization, Spinal mobilization, Cryotherapy, and Moist heat  PLAN FOR NEXT SESSION: update HEP as appropriate, activity modifications to accommodate mechanical stressors and allow healing, then motor control exercises to teach improved mechanics, progressive loading of the shoulder girdle, and reconditioning of the movements and tasks that previously irritated condition. Manual therapy as needed to restore motion and provide  sympotm modulation.    Camie SAUNDERS. Juli, PT, DPT, Cert. MDT 03/30/24, 9:35 AM  [facility] [address] [place], [place] [address] P: [phone] I [phone]

## 2024-04-04 ENCOUNTER — Ambulatory Visit: Admitting: Physical Therapy

## 2024-04-04 ENCOUNTER — Encounter: Payer: Self-pay | Admitting: Physical Therapy

## 2024-04-04 DIAGNOSIS — M6281 Muscle weakness (generalized): Secondary | ICD-10-CM

## 2024-04-04 DIAGNOSIS — M792 Neuralgia and neuritis, unspecified: Secondary | ICD-10-CM

## 2024-04-04 DIAGNOSIS — M25512 Pain in left shoulder: Secondary | ICD-10-CM | POA: Diagnosis not present

## 2024-04-04 NOTE — Therapy (Signed)
 OUTPATIENT PHYSICAL THERAPY TREATEMENT   Patient Name: BAKARY BRAMER MRN: [id number] DOB:[birth year], 38 y.o., male Today's Date: 04/04/2024  END OF SESSION:  PT End of Session - 04/04/24 1444     Visit Number 11    Number of Visits 17    Date for PT Re-Evaluation 05/02/24    Authorization Type [employer]  reporting period from 03/29/2024    Authorization Time Period 01/18/24-02/15/25  VL:max 30 PT vsts per plan year    Authorization - Number of Visits 30    Progress Note Due on Visit 10    PT Start Time 1438    PT Stop Time 1516    PT Time Calculation (min) 38 min    Activity Tolerance Patient tolerated treatment well    Behavior During Therapy WFL for tasks assessed/performed                    Past Medical History:  Diagnosis Date   Arthritis    wrists   History of chlamydia    History of herpes genitalis    Non Hodgkin's lymphoma (HCC)    in remission. completed treatments 08/2012   Seizures (HCC)    x1. as infant   Past Surgical History:  Procedure Laterality Date   LYMPH NODE BIOPSY     SHOULDER ARTHROSCOPY WITH LABRAL REPAIR Right 06/14/2017   Procedure: SHOULDER ARTHROSCOPY WITH LABRAL REPAIR and subacromial debridement.;  Surgeon: Marchia Drivers, MD;  Location: [facility];  Service: Orthopedics;  Laterality: Right;   WISDOM TOOTH EXTRACTION     Patient Active Problem List   Diagnosis Date Noted   Knee pain, bilateral 04/28/2022   Anxiety due to invasive procedure 02/19/2022   Degeneration of lumbar intervertebral disc 02/19/2022   Stiffness of right knee 07/03/2021   Cervical spine pain 04/02/2021   Cervical radiculopathy 04/02/2021   Cervical spondylosis 04/02/2021   Strain of neck muscle 04/02/2021   Right knee pain 12/20/2020   Research study patient 12/06/2019   Abdominal pain 02/22/2019   Proteinuria 02/22/2019   Asymptomatic microscopic hematuria 02/22/2019   Pain in joint involving ankle and foot 02/01/2019   Achilles bursitis  02/01/2019   Achilles tendinitis 02/01/2019   Hip pain 02/01/2019   Lumbar sprain 02/01/2019   Sprain of deltoid ligament of ankle 02/01/2019   Sprain of hand 02/01/2019   Pre-bariatric surgery nutrition evaluation 06/03/2017   Gastroesophageal reflux disease 05/24/2017   Type 2 HSV infection of penis 05/24/2017   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   Shoulder joint pain 05/14/2017   Non-Hodgkin lymphoma (HCC) 04/14/2017   Bilateral carpal tunnel syndrome 01/28/2017   Tear of right glenoid labrum 10/09/2014   Diffuse large B-cell lymphoma of lymph nodes of neck (HCC) 08/02/2014   History of antineoplastic chemotherapy 11/22/2012   History of radiation therapy 11/22/2012   Allergic rhinitis 08/18/2007    PCP: Claudene Tanda POUR, PA-C  REFERRING PROVIDER: Kathlynn Sharper, MD  REFERRING DIAG: Left rotator cuff tendinitis  THERAPY DIAG:  Acute pain of left shoulder  Neuralgia and neuritis  Muscle weakness (generalized)  Rationale for Evaluation and Treatment: Rehabilitation  ONSET DATE: June 2025  SUBJECTIVE:  PERTINENT HISTORY: Patient is a 38 y.o. male who presents to outpatient physical therapy with a referral for medical diagnosis left rotator cuff tendinitis. This patient's chief complaints consist of left shoulder pain and arm weakness with intermittent paresthesia to the hand, leading to the following functional deficits: difficulty with all activities that require use of L UE including reaching, carrying, lifting, restraining and holding animals, working, sleeping, housework, peparing food, grooming, pushing, driving, yardwork, dressing, using tools or appliances, laundry, throwing a ball, playing basket ball, going to the gym. Relevant past medical history and comorbidities include the following: he  has Pain in joint involving ankle and foot; Achilles bursitis; Achilles tendinitis; Allergic rhinitis; Bilateral carpal tunnel syndrome; Boutonniere deformity; Diffuse large B-cell lymphoma of lymph nodes of neck (HCC); Gastroesophageal reflux disease; Hip pain; History of antineoplastic chemotherapy; Lumbar sprain; Non-Hodgkin lymphoma (HCC); Sprain of deltoid ligament of ankle; Sprain of hand; Tear of right glenoid labrum; Type 2 HSV infection of penis; Shoulder joint pain; History of radiation therapy; Abdominal pain; Proteinuria; Asymptomatic microscopic hematuria; Pre-bariatric surgery nutrition evaluation; Research study patient; Right knee pain; Cervical spine pain; Cervical radiculopathy; Cervical spondylosis; Strain of neck muscle; Stiffness of right knee; Knee pain, bilateral;  and Degeneration of lumbar intervertebral disc on their problem list. he  has a past medical history of Arthritis, History of chlamydia, History of herpes genitalis, Non Hodgkin's lymphoma (HCC), and Seizures (as a baby). he  has a past surgical history that includes Wisdom tooth extraction; Lymph node biopsy; and Shoulder arthroscopy with labral repair (Right, 06/14/2017). Patient denies hx of stroke, lung problems, heart problems, diabetes, unexplained weight loss, unexplained changes in bowel or bladder problems, unexplained stumbling or dropping things, osteoporosis, and spinal surgery, or left shoulder surgery.   Exercise history: likes to go to the gym and play basketball. Might have a 5#DB. Also has curling bench press thing. He still has a Photographer.   Right handed  SUBJECTIVE STATEMENT: Patient states his shoulder is doing well. He states he started azithromycin  and allegra D for his sinus problems. He states he felt okay after last PT session.   Top three problems: abduction of the left shoulder (pain and weakness)  PAIN: NPRS: Current: 2/10 L pec region and anterior deltoid region  Best: 2-3/10, Worst:  8/10. Pain location: inside left shoulder, worst at anterior shoulder near pec, numbness over his whole left hand (currently there because he was poking the front of his shoulder).  Pain description: dull, Aggravating factors: the more he uses his left shoulder for anything weight wise including holding up kittens that are 2-3 lbs, reaching, sleeping on his left side (does not wake him), housework Relieving factors: as soon as he wakes up, hot bath   PRECAUTIONS: Other: don't lift anything over 10 lbs with left UE   PATIENT GOALS: back to normal hoping it ain't going to tear  NEXT MD VISIT: 5 weeks from last visit with Dr. Kathlynn   OBJECTIVE  TREATMENT  Therapeutic activities: dynamic therapeutic activities incorporating MULTIPLE parameters or areas of the body designed to achieve improved functional performance. Upper body ergometer level 11 to encourage joint nutrition, warm tissue, induce analgesic effect of aerobic exercise, improve muscular strength and endurance,  and prepare for remainder of session. 6 min total changing directions every 1 minute  BB bench press at rack 1x10 with 45#BB 1x10 at 75# 1x10 at 85# RPE 5-6/10 1x10 at 95# 1x10 at 105# RPE 8-9/10 (significant slow down last 3 reps)   Superset: Seated lat pull down at [facility] machine to improve lower trap activation and decrease UT activation 1x10 at 65#  1x10 at 85# 2x10 at 95#  Slightly reclined single arm overhead press on bench adjusted to 2nd from upright setting. Head back.  3x10 each side   Superset:  Prone W 3x10 with 3# DB Chin resting on bench   Seated bicep curls slightly reclined on weight bench to offload neck  1x10 with 10#DB in each hand 1x10 with 15#DB in each hand Head resting on bench   Pt required multimodal cuing for proper technique and to facilitate improved  neuromuscular control, strength, range of motion, and functional ability resulting in improved performance and form.    PATIENT EDUCATION: Education details: Exercise purpose/form. Self management techniques. Education on diagnosis, prognosis, POC, anatomy and physiology of current condition. Education on imaging.  Person educated: Patient Education method: Explanation Education comprehension: verbalized understanding and needs further education  HOME EXERCISE PROGRAM: Access Code: [email or web address] URL: [email or web address] Date: 03/07/2024 Prepared by: Camie Cleverly  Program Notes [email or web address]   Exercises - Lat Pull with band/cable  - 1 x daily - 2 sets - 10 reps - 5 seconds hold - Prone Shoulder External Rotation with Towel Roll  - 1 x daily - 3 sets - 15 reps  HOME EXERCISE PROGRAM [[email or web address]]  Prone B shoulder ER for lower traps -  Repeat 20 Repetitions, Hold 5 Seconds, Complete 2 Sets, Perform 1 Times a Day  ASSESSMENT:  CLINICAL IMPRESSION: Continued with progressive functional strengthening targeting the upper quarter. Pateint able to advance several exercises with good tolerance. Started overhead press with recline to decrease load on neck compared to double press straight overhead. Patient would benefit from continued management of limiting condition by skilled physical therapist to address remaining impairments and functional limitations to work towards stated goals and return to PLOF or maximal functional independence.    From initial PT evaluation  on 02/08/24:  Patient is a 38 y.o. male referred to outpatient physical therapy with a medical diagnosis of left rotator cuff tendinitis who presents with signs and symptoms consistent with left shoulder pain and L UE weakness and paresthesia following an injury in June. He appears to have myotomal weakness at C5 and C6 that improves with cervical unloading thorough distraction,  suggesting a cervical spine contribution to his condition. This would match with the has left sided upper trap discomfort he experiences with left cervical spine rotation, sidebending, and extension and the intermittent paresthesia to his hand. However, his DTRs appear equal and intact bilaterally. C5 myotomes tested today also correspond to the same motions  most commonly affected by a RTC condition (shoulder ER and abduction). Plan to address RTC and shoulder as well as any cervical spine contributions. Patient has good motion B shoulders except some limited overhead movement in the L. Patient presents with significant pain, ROM, joint stiffness, muscle performance (strength/power/endurance), paresthesia, and activity tolerance impairments that are limiting ability to  complete all activities that require use of L UE including reaching, carrying, lifting, restraining and holding animals, working, sleeping, housework, peparing food, grooming, pushing, driving, yardwork, dressing, using tools or appliances, laundry, throwing a ball, playing basket ball, going to the gym without difficulty. Patient will benefit from skilled physical therapy intervention to address current body structure impairments and activity limitations to improve function and work towards goals set in current POC in order to return to prior level of function or maximal functional improvement.    OBJECTIVE IMPAIRMENTS: decreased activity tolerance, decreased endurance, decreased knowledge of condition, decreased ROM, decreased strength, impaired UE functional use, improper body mechanics, postural dysfunction, and pain.   ACTIVITY LIMITATIONS: carrying, lifting, sleeping, bathing, dressing, reach over head, hygiene/grooming, and caring for others  PARTICIPATION LIMITATIONS: meal prep, cleaning, laundry, interpersonal relationship, driving, shopping, community activity, occupation, and yard work  PERSONAL FACTORS: Profession and 3+  comorbidities:   Allergic rhinitis; Bilateral carpal tunnel syndrome; Boutonniere deformity; Diffuse large B-cell lymphoma of lymph nodes of neck (HCC); Gastroesophageal reflux disease; Hip pain; History of antineoplastic chemotherapy; Lumbar sprain; Non-Hodgkin lymphoma (HCC);  Tear of right glenoid labrum; Shoulder joint pain; History of radiation therapy;  Cervical spine pain; Cervical radiculopathy; Cervical spondylosis;  and Degeneration of lumbar intervertebral disc on their problem list. he  has a past medical history of Arthritis,  Non Hodgkin's lymphoma (HCC), and Seizures (as a baby). he  has a past surgical history that includes Wisdom tooth extraction; Lymph node biopsy; and Shoulder arthroscopy with labral repair (Right, 06/14/2017) are also affecting patient's functional outcome.   REHAB POTENTIAL: Good  CLINICAL DECISION MAKING: Evolving/moderate complexity  EVALUATION COMPLEXITY: Moderate   GOALS: Goals reviewed with patient? No  SHORT TERM GOALS: Target date: 02/22/2024  Patient will be independent with initial home exercise program for self-management of symptoms. Baseline: Initial HEP to be provided at visit 2 as appropriate (02/08/24); Goal status: MET   LONG TERM GOALS: Target date: 05/02/2024  Patient will be independent with a long-term home exercise program for self-management of symptoms.  Baseline: Initial HEP to be provided at visit 2 as appropriate (02/08/24); completes HEP daily (03/29/2024);  Goal status: In progress  2.  Patient will demonstrate improved Upper Extremity Functional Scale (UEFS) to equal or greater than 64/80 to demonstrate improvement in overall condition and self-reported functional ability.  Baseline: 34/80 (02/08/24); Goal status: In progress  3.  Patient will demonstrate pain free L UE strength equal or greater to R UE strength to improve his ability to complete his job duties.  Baseline: decreased and painful (02/08/24); Goal status: In  progress  4.  Patient will demonstrate L shoulder AROM/PROM pain free and equal or greater to R shoulder to improve his ability to reach overhead and move into awkward positions while performing his job duties and leisure activities such as basketball and going to the gym.  Baseline: limited in overhead movement and painful (02/08/24); Goal status: In progress  5.  Patient will demonstrate improvement in Patient Specific Functional Scale (PSFS) of equal or greater than 8/10 points to reflect clinically significant improvement in patient's most valued functional activities. Baseline: to be measured at visit 2 as appropriate (02/08/24); 1/10 (02/10/24); did not capture (03/29/24); Goal status: In progress  6.  Patient will report NPRS equal or less than 2/10 during functional activities during the last 2 weeks to improve their abilitly to complete community, work and/or recreational activities with less limitation. Baseline: 8/10 (02/08/24); 5/10 (03/20/2024);  Goal status:  In progress   PLAN:  PT FREQUENCY: 1-2x/week  PT DURATION: 12 weeks  PLANNED INTERVENTIONS: 97164- PT Re-evaluation, 97750- Physical Performance Testing, 97110-Therapeutic exercises, 97530- Therapeutic activity, [id number]- Neuromuscular re-education, 97535- Self Care, 02859- Manual therapy, [id number]- Electrical stimulation (unattended), [phone]- Traction (mechanical), 20560 (1-2 muscles), 20561 (3+ muscles)- Dry Needling, Patient/Family education, Joint mobilization, Spinal mobilization, Cryotherapy, and Moist heat  PLAN FOR NEXT SESSION: update HEP as appropriate, activity modifications to accommodate mechanical stressors and allow healing, then motor control exercises to teach improved mechanics, progressive loading of the shoulder girdle, and reconditioning of the movements and tasks that previously irritated condition. Manual therapy as needed to restore motion and provide sympotm modulation.    Camie SAUNDERS. Juli, PT, DPT, Cert.  MDT 04/04/24, 3:18 PM  [facility] [address] [place], [place] [address] P: [phone] I F: [phone]

## 2024-04-11 ENCOUNTER — Encounter: Payer: Self-pay | Admitting: Physical Therapy

## 2024-04-11 ENCOUNTER — Ambulatory Visit: Admitting: Physical Therapy

## 2024-04-11 DIAGNOSIS — M6281 Muscle weakness (generalized): Secondary | ICD-10-CM | POA: Diagnosis not present

## 2024-04-11 DIAGNOSIS — M792 Neuralgia and neuritis, unspecified: Secondary | ICD-10-CM | POA: Diagnosis not present

## 2024-04-11 DIAGNOSIS — M25512 Pain in left shoulder: Secondary | ICD-10-CM

## 2024-04-11 NOTE — Therapy (Signed)
 OUTPATIENT PHYSICAL THERAPY TREATEMENT   Patient Name: Thomas Mckay MRN: [id number] DOB:August 26, 1985, 38 y.o., male Today's Date: 04/11/2024  END OF SESSION:  PT End of Session - 04/11/24 1827     Visit Number 12    Number of Visits 17    Date for Recertification  05/02/24    Authorization Type [employer]  reporting period from 03/29/2024    Authorization Time Period 01/18/24-02/15/25  VL:max 30 PT vsts per plan year    Authorization - Visit Number 12    Authorization - Number of Visits 30    Progress Note Due on Visit 10    PT Start Time 1824    PT Stop Time 1906    PT Time Calculation (min) 42 min    Activity Tolerance Patient tolerated treatment well    Behavior During Therapy WFL for tasks assessed/performed                     Past Medical History:  Diagnosis Date   Arthritis    wrists   History of chlamydia    History of herpes genitalis    Non Hodgkin's lymphoma (HCC)    in remission. completed treatments 08/2012   Seizures (HCC)    x1. as infant   Past Surgical History:  Procedure Laterality Date   LYMPH NODE BIOPSY     SHOULDER ARTHROSCOPY WITH LABRAL REPAIR Right 06/14/2017   Procedure: SHOULDER ARTHROSCOPY WITH LABRAL REPAIR and subacromial debridement.;  Surgeon: Marchia Drivers, MD;  Location: [facility];  Service: Orthopedics;  Laterality: Right;   WISDOM TOOTH EXTRACTION     Patient Active Problem List   Diagnosis Date Noted   Knee pain, bilateral 04/28/2022   Anxiety due to invasive procedure 02/19/2022   Degeneration of lumbar intervertebral disc 02/19/2022   Stiffness of right knee 07/03/2021   Cervical spine pain 04/02/2021   Cervical radiculopathy 04/02/2021   Cervical spondylosis 04/02/2021   Strain of neck muscle 04/02/2021   Right knee pain 12/20/2020   Research study patient 12/06/2019   Abdominal pain 02/22/2019   Proteinuria 02/22/2019   Asymptomatic microscopic hematuria 02/22/2019   Pain in joint involving ankle and  foot 02/01/2019   Achilles bursitis 02/01/2019   Achilles tendinitis 02/01/2019   Hip pain 02/01/2019   Lumbar sprain 02/01/2019   Sprain of deltoid ligament of ankle 02/01/2019   Sprain of hand 02/01/2019   Pre-bariatric surgery nutrition evaluation 06/03/2017   Gastroesophageal reflux disease 05/24/2017   Type 2 HSV infection of penis 05/24/2017   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   Shoulder joint pain 05/14/2017   Non-Hodgkin lymphoma (HCC) 04/14/2017   Bilateral carpal tunnel syndrome 01/28/2017   Tear of right glenoid labrum 10/09/2014   Diffuse large B-cell lymphoma of lymph nodes of neck (HCC) 08/02/2014   History of antineoplastic chemotherapy 11/22/2012   History of radiation therapy 11/22/2012   Allergic rhinitis 08/18/2007    PCP: Claudene Tanda POUR, PA-C  REFERRING PROVIDER: Kathlynn Sharper, MD  REFERRING DIAG: Left rotator cuff tendinitis  THERAPY DIAG:  Acute pain of left shoulder  Neuralgia and neuritis  Muscle weakness (generalized)  Rationale for Evaluation and Treatment: Rehabilitation  ONSET DATE: June 2025  SUBJECTIVE:  PERTINENT HISTORY: Patient is a 38 y.o. male who presents to outpatient physical therapy with a referral for medical diagnosis left rotator cuff tendinitis. This patient's chief complaints consist of left shoulder pain and arm weakness with intermittent paresthesia to the hand, leading to the following functional deficits: difficulty with all activities that require use of L UE including reaching, carrying, lifting, restraining and holding animals, working, sleeping, housework, peparing food, grooming, pushing, driving, yardwork, dressing, using tools or appliances, laundry, throwing a ball, playing basket ball, going to the gym. Relevant past medical history and  comorbidities include the following: he has Pain in joint involving ankle and foot; Achilles bursitis; Achilles tendinitis; Allergic rhinitis; Bilateral carpal tunnel syndrome; Boutonniere deformity; Diffuse large B-cell lymphoma of lymph nodes of neck (HCC); Gastroesophageal reflux disease; Hip pain; History of antineoplastic chemotherapy; Lumbar sprain; Non-Hodgkin lymphoma (HCC); Sprain of deltoid ligament of ankle; Sprain of hand; Tear of right glenoid labrum; Type 2 HSV infection of penis; Shoulder joint pain; History of radiation therapy; Abdominal pain; Proteinuria; Asymptomatic microscopic hematuria; Pre-bariatric surgery nutrition evaluation; Research study patient; Right knee pain; Cervical spine pain; Cervical radiculopathy; Cervical spondylosis; Strain of neck muscle; Stiffness of right knee; Knee pain, bilateral;  and Degeneration of lumbar intervertebral disc on their problem list. he  has a past medical history of Arthritis, History of chlamydia, History of herpes genitalis, Non Hodgkin's lymphoma (HCC), and Seizures (as a baby). he  has a past surgical history that includes Wisdom tooth extraction; Lymph node biopsy; and Shoulder arthroscopy with labral repair (Right, 06/14/2017). Patient denies hx of stroke, lung problems, heart problems, diabetes, unexplained weight loss, unexplained changes in bowel or bladder problems, unexplained stumbling or dropping things, osteoporosis, and spinal surgery, or left shoulder surgery.   Exercise history: likes to go to the gym and play basketball. Might have a 5#DB. Also has curling bench press thing. He still has a Photographer.   Right handed  SUBJECTIVE STATEMENT: Patient states his shoulder is doing better. It is feeling stronger with less pain. His headache has been bothering him again. HEP is going okay. He felt good after last PT session.   Top three problems: abduction of the left shoulder (pain and weakness)  PAIN: NPRS: Current: 1/10 L  pec region and anterior deltoid region.   Best: 2-3/10, Worst: 8/10. Pain location: inside left shoulder, worst at anterior shoulder near pec, numbness over his whole left hand (currently there because he was poking the front of his shoulder).  Pain description: dull, Aggravating factors: the more he uses his left shoulder for anything weight wise including holding up kittens that are 2-3 lbs, reaching, sleeping on his left side (does not wake him), housework Relieving factors: as soon as he wakes up, hot bath   PRECAUTIONS: Other: don't lift anything over 10 lbs with left UE   PATIENT GOALS: back to normal hoping it ain't going to tear  NEXT MD VISIT: 5 weeks from last visit with Dr. Kathlynn   OBJECTIVE  TREATMENT  Therapeutic activities: dynamic therapeutic activities incorporating MULTIPLE parameters or areas of the body designed to achieve improved functional performance. Upper body ergometer level 11 to encourage joint nutrition, warm tissue, induce analgesic effect of aerobic exercise, improve muscular strength and endurance,  and prepare for remainder of session. 4 min total changing directions every 1 minute  BB bench press at rack 1x10 with 45#BB 1x10 at 75# 1x10 at 95#  1x10 at 105# 1x6 at 115# RPE 10 (did not make 7th rep) 1x5 at 115#    Superset: Seated lat pull down at [facility] machine to improve lower trap activation and decrease UT activation 1x10 at 85# 2x10 at 105#  Slightly reclined single arm overhead press on bench adjusted to 2nd from upright setting. Head back.  1x10 each side with 15#DB (pain at left forearm from 4th rep on)   Superset:  Prone W 3x10 with 3# DB Chin resting on bench   Seated bicep curls slightly reclined on weight bench to offload neck  1x10 with 10#DB in each hand 1x10 with 15#DB in each hand Head resting on  bench  Manual therapy: to reduce pain and tissue tension, improve range of motion, neuromodulation, in order to promote improved ability to complete functional activities. SUPINE Intermittent manual cervical spine traction 6x10 seconds on/off   Pt required multimodal cuing for proper technique and to facilitate improved neuromuscular control, strength, range of motion, and functional ability resulting in improved performance and form.    PATIENT EDUCATION: Education details: Exercise purpose/form. Self management techniques. Education on diagnosis, prognosis, POC, anatomy and physiology of current condition. Education on imaging.  Person educated: Patient Education method: Explanation Education comprehension: verbalized understanding and needs further education  HOME EXERCISE PROGRAM: Access Code: [email or web address] URL: [email or web address] Date: 03/07/2024 Prepared by: Camie Cleverly  Program Notes [email or web address]   Exercises - Lat Pull with band/cable  - 1 x daily - 2 sets - 10 reps - 5 seconds hold - Prone Shoulder External Rotation with Towel Roll  - 1 x daily - 3 sets - 15 reps  HOME EXERCISE PROGRAM [[email or web address]]  Prone B shoulder ER for lower traps -  Repeat 20 Repetitions, Hold 5 Seconds, Complete 2 Sets, Perform 1 Times a Day  ASSESSMENT:  CLINICAL IMPRESSION: Pateint appears to be doing well. Continued with progressive strengthening for shoulder girdle and posture. He had some L forearm tightness/discomfort during L single arm overhead press that lingered afterwards so this exercise was discontinued for today. It was slightly relieved by cervical spine distraction. Plan to continue with strengthening as tolerated. Patient would benefit from continued management of limiting condition by skilled physical therapist to address remaining impairments and functional limitations to work towards stated goals and return to PLOF or  maximal functional independence.   From initial PT evaluation  on 02/08/24:  Patient is a 38 y.o. male referred to outpatient physical therapy with a medical diagnosis of left rotator cuff tendinitis who presents with signs and symptoms consistent with left shoulder pain and L UE weakness and paresthesia following an injury in June. He appears to have myotomal weakness at C5 and C6 that improves with cervical unloading thorough distraction, suggesting a cervical spine contribution to his condition. This would match with the has left sided upper trap discomfort he experiences with left cervical spine rotation, sidebending, and extension and the intermittent paresthesia to his hand. However, his DTRs appear equal and intact bilaterally. C5 myotomes tested today also correspond to the same motions  most  commonly affected by a RTC condition (shoulder ER and abduction). Plan to address RTC and shoulder as well as any cervical spine contributions. Patient has good motion B shoulders except some limited overhead movement in the L. Patient presents with significant pain, ROM, joint stiffness, muscle performance (strength/power/endurance), paresthesia, and activity tolerance impairments that are limiting ability to complete all activities that require use of L UE including reaching, carrying, lifting, restraining and holding animals, working, sleeping, housework, peparing food, grooming, pushing, driving, yardwork, dressing, using tools or appliances, laundry, throwing a ball, playing basket ball, going to the gym without difficulty. Patient will benefit from skilled physical therapy intervention to address current body structure impairments and activity limitations to improve function and work towards goals set in current POC in order to return to prior level of function or maximal functional improvement.    OBJECTIVE IMPAIRMENTS: decreased activity tolerance, decreased endurance, decreased knowledge of condition,  decreased ROM, decreased strength, impaired UE functional use, improper body mechanics, postural dysfunction, and pain.   ACTIVITY LIMITATIONS: carrying, lifting, sleeping, bathing, dressing, reach over head, hygiene/grooming, and caring for others  PARTICIPATION LIMITATIONS: meal prep, cleaning, laundry, interpersonal relationship, driving, shopping, community activity, occupation, and yard work  PERSONAL FACTORS: Profession and 3+ comorbidities:   Allergic rhinitis; Bilateral carpal tunnel syndrome; Boutonniere deformity; Diffuse large B-cell lymphoma of lymph nodes of neck (HCC); Gastroesophageal reflux disease; Hip pain; History of antineoplastic chemotherapy; Lumbar sprain; Non-Hodgkin lymphoma (HCC);  Tear of right glenoid labrum; Shoulder joint pain; History of radiation therapy;  Cervical spine pain; Cervical radiculopathy; Cervical spondylosis;  and Degeneration of lumbar intervertebral disc on their problem list. he  has a past medical history of Arthritis,  Non Hodgkin's lymphoma (HCC), and Seizures (as a baby). he  has a past surgical history that includes Wisdom tooth extraction; Lymph node biopsy; and Shoulder arthroscopy with labral repair (Right, 06/14/2017) are also affecting patient's functional outcome.   REHAB POTENTIAL: Good  CLINICAL DECISION MAKING: Evolving/moderate complexity  EVALUATION COMPLEXITY: Moderate   GOALS: Goals reviewed with patient? No  SHORT TERM GOALS: Target date: 02/22/2024  Patient will be independent with initial home exercise program for self-management of symptoms. Baseline: Initial HEP to be provided at visit 2 as appropriate (02/08/24); Goal status: MET   LONG TERM GOALS: Target date: 05/02/2024  Patient will be independent with a long-term home exercise program for self-management of symptoms.  Baseline: Initial HEP to be provided at visit 2 as appropriate (02/08/24); completes HEP daily (03/29/2024);  Goal status: In progress  2.  Patient  will demonstrate improved Upper Extremity Functional Scale (UEFS) to equal or greater than 64/80 to demonstrate improvement in overall condition and self-reported functional ability.  Baseline: 34/80 (02/08/24); Goal status: In progress  3.  Patient will demonstrate pain free L UE strength equal or greater to R UE strength to improve his ability to complete his job duties.  Baseline: decreased and painful (02/08/24); Goal status: In progress  4.  Patient will demonstrate L shoulder AROM/PROM pain free and equal or greater to R shoulder to improve his ability to reach overhead and move into awkward positions while performing his job duties and leisure activities such as basketball and going to the gym.  Baseline: limited in overhead movement and painful (02/08/24); Goal status: In progress  5.  Patient will demonstrate improvement in Patient Specific Functional Scale (PSFS) of equal or greater than 8/10 points to reflect clinically significant improvement in patient's most valued functional activities. Baseline: to be measured  at visit 2 as appropriate (02/08/24); 1/10 (02/10/24); did not capture (03/29/24); Goal status: In progress  6.  Patient will report NPRS equal or less than 2/10 during functional activities during the last 2 weeks to improve their abilitly to complete community, work and/or recreational activities with less limitation. Baseline: 8/10 (02/08/24); 5/10 (03/20/2024);  Goal status: In progress   PLAN:  PT FREQUENCY: 1-2x/week  PT DURATION: 12 weeks  PLANNED INTERVENTIONS: 97164- PT Re-evaluation, 97750- Physical Performance Testing, 97110-Therapeutic exercises, 97530- Therapeutic activity, [id number]- Neuromuscular re-education, 97535- Self Care, 02859- Manual therapy, [id number]- Electrical stimulation (unattended), [id number]- Traction (mechanical), 20560 (1-2 muscles), 20561 (3+ muscles)- Dry Needling, Patient/Family education, Joint mobilization, Spinal mobilization, Cryotherapy, and  Moist heat  PLAN FOR NEXT SESSION: update HEP as appropriate, activity modifications to accommodate mechanical stressors and allow healing, then motor control exercises to teach improved mechanics, progressive loading of the shoulder girdle, and reconditioning of the movements and tasks that previously irritated condition. Manual therapy as needed to restore motion and provide sympotm modulation.    Camie SAUNDERS. Juli, PT, DPT, Cert. MDT 04/11/24, 7:16 PM  [facility] [address] [place], [place] [address] P: [phone] I F: [phone]

## 2024-04-12 DIAGNOSIS — M7582 Other shoulder lesions, left shoulder: Secondary | ICD-10-CM | POA: Diagnosis not present

## 2024-04-13 ENCOUNTER — Telehealth: Payer: Self-pay | Admitting: Physical Therapy

## 2024-04-13 ENCOUNTER — Ambulatory Visit: Admitting: Physical Therapy

## 2024-04-13 NOTE — Telephone Encounter (Signed)
 Spoke with patient notifying patient of missed PT visit scheduled at 9am today. He appologized, saying it was really busy at work and he forgot his appointment. He also said his doctor gave him a cortisone shot in the shoulder yesterday and told I'm not to do PT for the rest of this week. Let patient know that with any no-show I am required to review our cancellation policy that after 2 no-shows we remove future visits from the schedule, they are responsible to calling in to reschedule, and they will only be able to schedule one appointment at a time. However, I went ahead and called him this time and am not going to take him off the schedule since it has been over a month since his first no show. Emphasized that he needs to call to cancel if he is not going to be able to come.    Camie SAUNDERS. Juli, PT, DPT 04/13/24, 9:17 AM  [facility] [address] [place], [place] [address] P: [phone] I F: [phone]

## 2024-04-19 ENCOUNTER — Encounter: Payer: Self-pay | Admitting: Physical Therapy

## 2024-04-19 ENCOUNTER — Ambulatory Visit: Attending: Orthopedic Surgery | Admitting: Physical Therapy

## 2024-04-19 DIAGNOSIS — M79642 Pain in left hand: Secondary | ICD-10-CM | POA: Insufficient documentation

## 2024-04-19 DIAGNOSIS — M6281 Muscle weakness (generalized): Secondary | ICD-10-CM | POA: Diagnosis not present

## 2024-04-19 DIAGNOSIS — M792 Neuralgia and neuritis, unspecified: Secondary | ICD-10-CM | POA: Diagnosis not present

## 2024-04-19 DIAGNOSIS — M79641 Pain in right hand: Secondary | ICD-10-CM | POA: Insufficient documentation

## 2024-04-19 DIAGNOSIS — M25512 Pain in left shoulder: Secondary | ICD-10-CM | POA: Insufficient documentation

## 2024-04-19 NOTE — Therapy (Signed)
 OUTPATIENT PHYSICAL THERAPY TREATEMENT   Patient Name: Thomas Mckay MRN: [id number] DOB:1986-05-27, 38 y.o., male Today's Date: 04/19/2024  END OF SESSION:  PT End of Session - 04/19/24 1350     Visit Number 13    Number of Visits 17    Date for Recertification  05/02/24    Authorization Type [employer]  reporting period from 03/29/2024    Authorization Time Period 01/18/24-02/15/25  VL:max 30 PT vsts per plan year    Authorization - Visit Number 13    Authorization - Number of Visits 30    Progress Note Due on Visit 10    PT Start Time 1349    PT Stop Time 1429    PT Time Calculation (min) 40 min    Activity Tolerance Patient tolerated treatment well    Behavior During Therapy WFL for tasks assessed/performed                      Past Medical History:  Diagnosis Date   Arthritis    wrists   History of chlamydia    History of herpes genitalis    Non Hodgkin's lymphoma (HCC)    in remission. completed treatments 08/2012   Seizures (HCC)    x1. as infant   Past Surgical History:  Procedure Laterality Date   LYMPH NODE BIOPSY     SHOULDER ARTHROSCOPY WITH LABRAL REPAIR Right 06/14/2017   Procedure: SHOULDER ARTHROSCOPY WITH LABRAL REPAIR and subacromial debridement.;  Surgeon: Marchia Drivers, MD;  Location: [facility];  Service: Orthopedics;  Laterality: Right;   WISDOM TOOTH EXTRACTION     Patient Active Problem List   Diagnosis Date Noted   Knee pain, bilateral 04/28/2022   Anxiety due to invasive procedure 02/19/2022   Degeneration of lumbar intervertebral disc 02/19/2022   Stiffness of right knee 07/03/2021   Cervical spine pain 04/02/2021   Cervical radiculopathy 04/02/2021   Cervical spondylosis 04/02/2021   Strain of neck muscle 04/02/2021   Right knee pain 12/20/2020   Research study patient 12/06/2019   Abdominal pain 02/22/2019   Proteinuria 02/22/2019   Asymptomatic microscopic hematuria 02/22/2019   Pain in joint involving ankle and  foot 02/01/2019   Achilles bursitis 02/01/2019   Achilles tendinitis 02/01/2019   Hip pain 02/01/2019   Lumbar sprain 02/01/2019   Sprain of deltoid ligament of ankle 02/01/2019   Sprain of hand 02/01/2019   Pre-bariatric surgery nutrition evaluation 06/03/2017   Gastroesophageal reflux disease 05/24/2017   Type 2 HSV infection of penis 05/24/2017   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   Shoulder joint pain 05/14/2017   Non-Hodgkin lymphoma (HCC) 04/14/2017   Bilateral carpal tunnel syndrome 01/28/2017   Tear of right glenoid labrum 10/09/2014   Diffuse large B-cell lymphoma of lymph nodes of neck (HCC) 08/02/2014   History of antineoplastic chemotherapy 11/22/2012   History of radiation therapy 11/22/2012   Allergic rhinitis 08/18/2007    PCP: Claudene Tanda POUR, PA-C  REFERRING PROVIDER: Kathlynn Sharper, MD  REFERRING DIAG: Left rotator cuff tendinitis  THERAPY DIAG:  Acute pain of left shoulder  Neuralgia and neuritis  Muscle weakness (generalized)  Bilateral hand pain  Rationale for Evaluation and Treatment: Rehabilitation  ONSET DATE: June 2025  SUBJECTIVE:  PERTINENT HISTORY: Patient is a 38 y.o. male who presents to outpatient physical therapy with a referral for medical diagnosis left rotator cuff tendinitis. This patient's chief complaints consist of left shoulder pain and arm weakness with intermittent paresthesia to the hand, leading to the following functional deficits: difficulty with all activities that require use of L UE including reaching, carrying, lifting, restraining and holding animals, working, sleeping, housework, peparing food, grooming, pushing, driving, yardwork, dressing, using tools or appliances, laundry, throwing a ball, playing basket ball, going to the gym. Relevant past  medical history and comorbidities include the following: he has Pain in joint involving ankle and foot; Achilles bursitis; Achilles tendinitis; Allergic rhinitis; Bilateral carpal tunnel syndrome; Boutonniere deformity; Diffuse large B-cell lymphoma of lymph nodes of neck (HCC); Gastroesophageal reflux disease; Hip pain; History of antineoplastic chemotherapy; Lumbar sprain; Non-Hodgkin lymphoma (HCC); Sprain of deltoid ligament of ankle; Sprain of hand; Tear of right glenoid labrum; Type 2 HSV infection of penis; Shoulder joint pain; History of radiation therapy; Abdominal pain; Proteinuria; Asymptomatic microscopic hematuria; Pre-bariatric surgery nutrition evaluation; Research study patient; Right knee pain; Cervical spine pain; Cervical radiculopathy; Cervical spondylosis; Strain of neck muscle; Stiffness of right knee; Knee pain, bilateral;  and Degeneration of lumbar intervertebral disc on their problem list. he  has a past medical history of Arthritis, History of chlamydia, History of herpes genitalis, Non Hodgkin's lymphoma (HCC), and Seizures (as a baby). he  has a past surgical history that includes Wisdom tooth extraction; Lymph node biopsy; and Shoulder arthroscopy with labral repair (Right, 06/14/2017). Patient denies hx of stroke, lung problems, heart problems, diabetes, unexplained weight loss, unexplained changes in bowel or bladder problems, unexplained stumbling or dropping things, osteoporosis, and spinal surgery, or left shoulder surgery.   Exercise history: likes to go to the gym and play basketball. Might have a 5#DB. Also has curling bench press thing. He still has a Photographer.   Right handed  SUBJECTIVE STATEMENT: Patient states his left shoulder is doing okay. He got the injection last week at Dr. Kathlynn office. They are concerned about a SLAP tear/biceps tendon pathology. Patient states he did not notice any improvement with the shot. He felt okay after last PT session. He states  he has better and worse days. He is discouraged after Dr. Kathlynn suggested surgery as an option if injection was ineffective.   Top three problems: abduction of the left shoulder (pain and weakness)  PAIN: NPRS: Current: 2/10 L pec and clavicular region  Best: 2-3/10, Worst: 8/10. Pain location: inside left shoulder, worst at anterior shoulder near pec, numbness over his whole left hand (currently there because he was poking the front of his shoulder).  Pain description: dull, Aggravating factors: the more he uses his left shoulder for anything weight wise including holding up kittens that are 2-3 lbs, reaching, sleeping on his left side (does not wake him), housework Relieving factors: as soon as he wakes up, hot bath   PRECAUTIONS: Other: don't lift anything over 10 lbs with left UE   PATIENT GOALS: back to normal hoping it ain't going to tear  NEXT MD VISIT: 5 weeks from last visit with Dr. Kathlynn   OBJECTIVE  TREATMENT  Therapeutic activities: dynamic therapeutic activities incorporating MULTIPLE parameters or areas of the body designed to achieve improved functional performance. Upper body ergometer level 11 to encourage joint nutrition, warm tissue, induce analgesic effect of aerobic exercise, improve muscular strength and endurance,  and prepare for remainder of session. 4 min total changing directions every 1 minute  Band anchored at chest level (2nd set completed after manual traction) B shoulder horizontal abduction T 2x10 with YTB (tried RTB but painful, decreased to YTB and able to perform with slow tempo until 6th rep when it got more painful B shoulder extension with palms forwards 2x10 with YTB Painful if moving hand past thigh or further retracting scapula B shoulder face pulls 2x10 with YTB More comfortable than the first two exercises,  worst pain is on the return.  B shoulder IR/throw to flexion with elbow starting near 90 degrees and elbow flexed 2x10 No pain  Manual therapy: to reduce pain and tissue tension, improve range of motion, neuromodulation, in order to promote improved ability to complete functional activities. SUPINE Intermittent manual cervical spine traction 10x10 seconds on/off Feels relief at L shoulder with traction   Pt required multimodal cuing for proper technique and to facilitate improved neuromuscular control, strength, range of motion, and functional ability resulting in improved performance and form.    PATIENT EDUCATION: Education details: Exercise purpose/form. Self management techniques. Education on diagnosis, prognosis, POC, anatomy and physiology of current condition. Education on imaging.  Person educated: Patient Education method: Explanation Education comprehension: verbalized understanding and needs further education  HOME EXERCISE PROGRAM: Access Code: [email or web address] URL: [email or web address] Date: 03/07/2024 Prepared by: Camie Cleverly  Program Notes [email or web address]   Exercises - Lat Pull with band/cable  - 1 x daily - 2 sets - 10 reps - 5 seconds hold - Prone Shoulder External Rotation with Towel Roll  - 1 x daily - 3 sets - 15 reps  HOME EXERCISE PROGRAM [[id number]]  Prone B shoulder ER for lower traps -  Repeat 20 Repetitions, Hold 5 Seconds, Complete 2 Sets, Perform 1 Times a Day  ASSESSMENT:  CLINICAL IMPRESSION: Pateint more discouraged today after an injection last week did not improve his symptoms at all. He also had slightly worse L shoulder pain. Changed exercises to be more challenging to abduction and pt had more difficulty with them due to L shoulder pain. However, he felt relief with manual traction to the cervical spine and was able to repeat the exercises following 1x10 seconds on/off of manual traction at  the cervical spine. This suggests cervical contribution to symptoms with load sensitivity. Plan to continue to treat including unloading to cervical spine. Overall he ended the session the same as when he arrived. Patient would benefit from continued management of limiting condition by skilled physical therapist to address remaining impairments and functional limitations to work towards stated goals and return to PLOF or maximal functional independence.   From initial PT evaluation  on 02/08/24:  Patient is a 38 y.o. male referred to outpatient physical therapy with a medical diagnosis of left rotator cuff tendinitis who presents with signs and symptoms consistent with left shoulder pain and L UE weakness and paresthesia following an injury in June. He appears to have myotomal weakness at C5 and C6 that improves with cervical unloading thorough distraction, suggesting a cervical spine contribution to his condition. This would match with the has left sided upper trap discomfort he experiences with left cervical spine rotation, sidebending, and extension and the intermittent  paresthesia to his hand. However, his DTRs appear equal and intact bilaterally. C5 myotomes tested today also correspond to the same motions  most commonly affected by a RTC condition (shoulder ER and abduction). Plan to address RTC and shoulder as well as any cervical spine contributions. Patient has good motion B shoulders except some limited overhead movement in the L. Patient presents with significant pain, ROM, joint stiffness, muscle performance (strength/power/endurance), paresthesia, and activity tolerance impairments that are limiting ability to complete all activities that require use of L UE including reaching, carrying, lifting, restraining and holding animals, working, sleeping, housework, peparing food, grooming, pushing, driving, yardwork, dressing, using tools or appliances, laundry, throwing a ball, playing basket ball, going to  the gym without difficulty. Patient will benefit from skilled physical therapy intervention to address current body structure impairments and activity limitations to improve function and work towards goals set in current POC in order to return to prior level of function or maximal functional improvement.    OBJECTIVE IMPAIRMENTS: decreased activity tolerance, decreased endurance, decreased knowledge of condition, decreased ROM, decreased strength, impaired UE functional use, improper body mechanics, postural dysfunction, and pain.   ACTIVITY LIMITATIONS: carrying, lifting, sleeping, bathing, dressing, reach over head, hygiene/grooming, and caring for others  PARTICIPATION LIMITATIONS: meal prep, cleaning, laundry, interpersonal relationship, driving, shopping, community activity, occupation, and yard work  PERSONAL FACTORS: Profession and 3+ comorbidities:   Allergic rhinitis; Bilateral carpal tunnel syndrome; Boutonniere deformity; Diffuse large B-cell lymphoma of lymph nodes of neck (HCC); Gastroesophageal reflux disease; Hip pain; History of antineoplastic chemotherapy; Lumbar sprain; Non-Hodgkin lymphoma (HCC);  Tear of right glenoid labrum; Shoulder joint pain; History of radiation therapy;  Cervical spine pain; Cervical radiculopathy; Cervical spondylosis;  and Degeneration of lumbar intervertebral disc on their problem list. he  has a past medical history of Arthritis,  Non Hodgkin's lymphoma (HCC), and Seizures (as a baby). he  has a past surgical history that includes Wisdom tooth extraction; Lymph node biopsy; and Shoulder arthroscopy with labral repair (Right, 06/14/2017) are also affecting patient's functional outcome.   REHAB POTENTIAL: Good  CLINICAL DECISION MAKING: Evolving/moderate complexity  EVALUATION COMPLEXITY: Moderate   GOALS: Goals reviewed with patient? No  SHORT TERM GOALS: Target date: 02/22/2024  Patient will be independent with initial home exercise program for  self-management of symptoms. Baseline: Initial HEP to be provided at visit 2 as appropriate (02/08/24); Goal status: MET   LONG TERM GOALS: Target date: 05/02/2024  Patient will be independent with a long-term home exercise program for self-management of symptoms.  Baseline: Initial HEP to be provided at visit 2 as appropriate (02/08/24); completes HEP daily (03/29/2024);  Goal status: In progress  2.  Patient will demonstrate improved Upper Extremity Functional Scale (UEFS) to equal or greater than 64/80 to demonstrate improvement in overall condition and self-reported functional ability.  Baseline: 34/80 (02/08/24); Goal status: In progress  3.  Patient will demonstrate pain free L UE strength equal or greater to R UE strength to improve his ability to complete his job duties.  Baseline: decreased and painful (02/08/24); Goal status: In progress  4.  Patient will demonstrate L shoulder AROM/PROM pain free and equal or greater to R shoulder to improve his ability to reach overhead and move into awkward positions while performing his job duties and leisure activities such as basketball and going to the gym.  Baseline: limited in overhead movement and painful (02/08/24); Goal status: In progress  5.  Patient will demonstrate improvement in Patient Specific Functional  Scale (PSFS) of equal or greater than 8/10 points to reflect clinically significant improvement in patient's most valued functional activities. Baseline: to be measured at visit 2 as appropriate (02/08/24); 1/10 (02/10/24); did not capture (03/29/24); Goal status: In progress  6.  Patient will report NPRS equal or less than 2/10 during functional activities during the last 2 weeks to improve their abilitly to complete community, work and/or recreational activities with less limitation. Baseline: 8/10 (02/08/24); 5/10 (03/20/2024);  Goal status: In progress   PLAN:  PT FREQUENCY: 1-2x/week  PT DURATION: 12 weeks  PLANNED  INTERVENTIONS: 97164- PT Re-evaluation, 97750- Physical Performance Testing, 97110-Therapeutic exercises, 97530- Therapeutic activity, [id number]- Neuromuscular re-education, 97535- Self Care, [id number]- Manual therapy, [id number]- Electrical stimulation (unattended), [phone]- Traction (mechanical), 20560 (1-2 muscles), 20561 (3+ muscles)- Dry Needling, Patient/Family education, Joint mobilization, Spinal mobilization, Cryotherapy, and Moist heat  PLAN FOR NEXT SESSION: update HEP as appropriate, activity modifications to accommodate mechanical stressors and allow healing, then motor control exercises to teach improved mechanics, progressive loading of the shoulder girdle, and reconditioning of the movements and tasks that previously irritated condition. Manual therapy as needed to restore motion and provide sympotm modulation.    Camie SAUNDERS. Juli, PT, DPT, Cert. MDT 04/19/24, 2:30 PM  [facility] [address] [place], [place] [address] P: [phone] I F: [phone]

## 2024-04-25 ENCOUNTER — Ambulatory Visit

## 2024-04-25 DIAGNOSIS — M79642 Pain in left hand: Secondary | ICD-10-CM | POA: Diagnosis not present

## 2024-04-25 DIAGNOSIS — M792 Neuralgia and neuritis, unspecified: Secondary | ICD-10-CM | POA: Diagnosis not present

## 2024-04-25 DIAGNOSIS — M6281 Muscle weakness (generalized): Secondary | ICD-10-CM | POA: Diagnosis not present

## 2024-04-25 DIAGNOSIS — M25512 Pain in left shoulder: Secondary | ICD-10-CM

## 2024-04-25 DIAGNOSIS — M79641 Pain in right hand: Secondary | ICD-10-CM | POA: Diagnosis not present

## 2024-04-25 NOTE — Therapy (Signed)
 OUTPATIENT PHYSICAL THERAPY TREATEMENT   Patient Name: Thomas Mckay MRN: [id number] DOB:03-11-1986, 38 y.o., male Today's Date: 04/25/2024  END OF SESSION:  PT End of Session - 04/25/24 1608     Visit Number 14    Number of Visits 17    Date for Recertification  05/02/24    Authorization Type [employer]  reporting period from 03/29/2024    Authorization Time Period 01/18/24-02/15/25  VL:max 30 PT vsts per plan year    Authorization - Number of Visits 30    Progress Note Due on Visit 10    PT Start Time 1607    PT Stop Time 1645    PT Time Calculation (min) 38 min    Activity Tolerance Patient tolerated treatment well    Behavior During Therapy WFL for tasks assessed/performed           Past Medical History:  Diagnosis Date   Arthritis    wrists   History of chlamydia    History of herpes genitalis    Non Hodgkin's lymphoma (HCC)    in remission. completed treatments 08/2012   Seizures (HCC)    x1. as infant   Past Surgical History:  Procedure Laterality Date   LYMPH NODE BIOPSY     SHOULDER ARTHROSCOPY WITH LABRAL REPAIR Right 06/14/2017   Procedure: SHOULDER ARTHROSCOPY WITH LABRAL REPAIR and subacromial debridement.;  Surgeon: Marchia Drivers, MD;  Location: [facility];  Service: Orthopedics;  Laterality: Right;   WISDOM TOOTH EXTRACTION     Patient Active Problem List   Diagnosis Date Noted   Knee pain, bilateral 04/28/2022   Anxiety due to invasive procedure 02/19/2022   Degeneration of lumbar intervertebral disc 02/19/2022   Stiffness of right knee 07/03/2021   Cervical spine pain 04/02/2021   Cervical radiculopathy 04/02/2021   Cervical spondylosis 04/02/2021   Strain of neck muscle 04/02/2021   Right knee pain 12/20/2020   Research study patient 12/06/2019   Abdominal pain 02/22/2019   Proteinuria 02/22/2019   Asymptomatic microscopic hematuria 02/22/2019   Pain in joint involving ankle and foot 02/01/2019   Achilles bursitis 02/01/2019    Achilles tendinitis 02/01/2019   Hip pain 02/01/2019   Lumbar sprain 02/01/2019   Sprain of deltoid ligament of ankle 02/01/2019   Sprain of hand 02/01/2019   Pre-bariatric surgery nutrition evaluation 06/03/2017   Gastroesophageal reflux disease 05/24/2017   Type 2 HSV infection of penis 05/24/2017   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   Shoulder joint pain 05/14/2017   Non-Hodgkin lymphoma (HCC) 04/14/2017   Bilateral carpal tunnel syndrome 01/28/2017   Tear of right glenoid labrum 10/09/2014   Diffuse large B-cell lymphoma of lymph nodes of neck (HCC) 08/02/2014   History of antineoplastic chemotherapy 11/22/2012   History of radiation therapy 11/22/2012   Allergic rhinitis 08/18/2007    PCP: Claudene Tanda POUR, PA-C  REFERRING PROVIDER: Kathlynn Sharper, MD  REFERRING DIAG: Left rotator cuff tendinitis  THERAPY DIAG:  Acute pain of left shoulder  Muscle weakness (generalized)  Neuralgia and neuritis  Rationale for Evaluation and Treatment: Rehabilitation  ONSET DATE: June 2025  SUBJECTIVE:  PERTINENT HISTORY: Patient is a 38 y.o. male who presents to outpatient physical therapy with a referral for medical diagnosis left rotator cuff tendinitis. This patient's chief complaints consist of left shoulder pain and arm weakness with intermittent paresthesia to the hand, leading to the following functional deficits: difficulty with all activities that require use of L UE including reaching, carrying, lifting, restraining and holding animals, working, sleeping, housework, peparing food, grooming, pushing, driving, yardwork, dressing, using tools or appliances, laundry, throwing a ball, playing basket ball, going to the gym. Relevant past medical history and comorbidities include the following: he has Pain in  joint involving ankle and foot; Achilles bursitis; Achilles tendinitis; Allergic rhinitis; Bilateral carpal tunnel syndrome; Boutonniere deformity; Diffuse large B-cell lymphoma of lymph nodes of neck (HCC); Gastroesophageal reflux disease; Hip pain; History of antineoplastic chemotherapy; Lumbar sprain; Non-Hodgkin lymphoma (HCC); Sprain of deltoid ligament of ankle; Sprain of hand; Tear of right glenoid labrum; Type 2 HSV infection of penis; Shoulder joint pain; History of radiation therapy; Abdominal pain; Proteinuria; Asymptomatic microscopic hematuria; Pre-bariatric surgery nutrition evaluation; Research study patient; Right knee pain; Cervical spine pain; Cervical radiculopathy; Cervical spondylosis; Strain of neck muscle; Stiffness of right knee; Knee pain, bilateral;  and Degeneration of lumbar intervertebral disc on their problem list. he  has a past medical history of Arthritis, History of chlamydia, History of herpes genitalis, Non Hodgkin's lymphoma (HCC), and Seizures (as a baby). he  has a past surgical history that includes Wisdom tooth extraction; Lymph node biopsy; and Shoulder arthroscopy with labral repair (Right, 06/14/2017). Patient denies hx of stroke, lung problems, heart problems, diabetes, unexplained weight loss, unexplained changes in bowel or bladder problems, unexplained stumbling or dropping things, osteoporosis, and spinal surgery, or left shoulder surgery.   Exercise history: likes to go to the gym and play basketball. Might have a 5#DB. Also has curling bench press thing. He still has a Photographer.   Right handed  SUBJECTIVE STATEMENT: Patient states his left shoulder is doing okay. He got the injection last week at Dr. Kathlynn office. They are concerned about a SLAP tear/biceps tendon pathology. Patient states he did not notice any improvement with the shot. He felt okay after last PT session. He states he has better and worse days. He is discouraged after Dr. Kathlynn suggested  surgery as an option if injection was ineffective.   Top three problems: abduction of the left shoulder (pain and weakness)  PAIN: NPRS: Current: 2/10 L pec and clavicular region  Best: 2-3/10, Worst: 8/10. Pain location: inside left shoulder, worst at anterior shoulder near pec, numbness over his whole left hand (currently there because he was poking the front of his shoulder).  Pain description: dull, Aggravating factors: the more he uses his left shoulder for anything weight wise including holding up kittens that are 2-3 lbs, reaching, sleeping on his left side (does not wake him), housework Relieving factors: as soon as he wakes up, hot bath   PRECAUTIONS: Other: don't lift anything over 10 lbs with left UE   PATIENT GOALS: back to normal hoping it ain't going to tear  NEXT MD VISIT: 5 weeks from last visit with Dr. Kathlynn   OBJECTIVE  TODAY'S TREATMENT: 04/25/24   Subjective: patient reports feeling good this date. He reports ongoing pain with lowering his arm and carrying 10-20# in his left hand.  Therapeutic activity:  Upper body ergometer level 11 to encourage joint nutrition, warm tissue, induce analgesic effect of aerobic exercise, improve muscular strength and endurance,  and prepare for remainder of session. 5 min total changing directions after 2.5 minutes   Band anchored at chest level (2nd set completed after manual traction) B shoulder horizontal abduction T 2x10 with YTB (tried RTB but painful, decreased to YTB and able to perform with slow tempo until 6th rep when it got more painful B shoulder extension with palms forwards 2x10 with RTB B shoulder face pulls 2x10 with RTB   Seated UT stretch with GHJ anchored (holding chair) 2 x 30 seconds each side      B shoulder abduction with 2# DB 3 x 10 in standing    Rhythmic stabilization  with ball circles CW/CCW 2 x 25 each direction       Pt required multimodal cuing for proper technique and to facilitate improved neuromuscular control, strength, range of motion, and functional ability resulting in improved performance and form.    PATIENT EDUCATION: Education details: Exercise purpose/form. Self management techniques. Education on diagnosis, prognosis, POC, anatomy and physiology of current condition. Education on imaging.  Person educated: Patient Education method: Explanation Education comprehension: verbalized understanding and needs further education  HOME EXERCISE PROGRAM: Access Code: [email or web address] URL: [email or web address] Date: 03/07/2024 Prepared by: Camie Cleverly  Program Notes [email or web address]   Exercises - Lat Pull with band/cable  - 1 x daily - 2 sets - 10 reps - 5 seconds hold - Prone Shoulder External Rotation with Towel Roll  - 1 x daily - 3 sets - 15 reps  HOME EXERCISE PROGRAM [[id number]]  Prone B shoulder ER for lower traps -  Repeat 20 Repetitions, Hold 5 Seconds, Complete 2 Sets, Perform 1 Times a Day  ASSESSMENT:  CLINICAL IMPRESSION:   Continued PT POC focused on L shoulder pain. Session focused on strengthening and stabilization with good tolerance. Able to tolerance increased resistance this date. Patient would benefit from continued management of limiting condition by skilled physical therapist to address remaining impairments and functional limitations to work towards stated goals and return to PLOF or maximal functional independence.   From initial PT evaluation  on 02/08/24:  Patient is a 38 y.o. male referred to outpatient physical therapy with a medical diagnosis of left rotator cuff tendinitis who presents with signs and symptoms consistent with left shoulder pain and L UE weakness and paresthesia following an injury in June. He appears to have myotomal weakness at C5 and C6 that  improves with cervical unloading thorough distraction, suggesting a cervical spine contribution to his condition. This would match with the has left sided upper trap discomfort he experiences with left cervical spine rotation, sidebending, and extension and the intermittent paresthesia to his hand. However, his DTRs appear equal and intact bilaterally. C5 myotomes tested today also correspond to the same motions  most commonly affected by a RTC condition (shoulder ER and abduction). Plan to address RTC and shoulder as well as any cervical spine contributions. Patient has good motion B shoulders except some limited overhead movement in the L. Patient presents with significant pain, ROM, joint stiffness, muscle performance (strength/power/endurance), paresthesia, and activity tolerance impairments that are limiting ability to complete all activities that require use of L UE including reaching, carrying, lifting, restraining and  holding animals, working, sleeping, housework, peparing food, grooming, pushing, driving, yardwork, dressing, using tools or appliances, laundry, throwing a ball, playing basket ball, going to the gym without difficulty. Patient will benefit from skilled physical therapy intervention to address current body structure impairments and activity limitations to improve function and work towards goals set in current POC in order to return to prior level of function or maximal functional improvement.    OBJECTIVE IMPAIRMENTS: decreased activity tolerance, decreased endurance, decreased knowledge of condition, decreased ROM, decreased strength, impaired UE functional use, improper body mechanics, postural dysfunction, and pain.   ACTIVITY LIMITATIONS: carrying, lifting, sleeping, bathing, dressing, reach over head, hygiene/grooming, and caring for others  PARTICIPATION LIMITATIONS: meal prep, cleaning, laundry, interpersonal relationship, driving, shopping, community activity, occupation, and  yard work  PERSONAL FACTORS: Profession and 3+ comorbidities:   Allergic rhinitis; Bilateral carpal tunnel syndrome; Boutonniere deformity; Diffuse large B-cell lymphoma of lymph nodes of neck (HCC); Gastroesophageal reflux disease; Hip pain; History of antineoplastic chemotherapy; Lumbar sprain; Non-Hodgkin lymphoma (HCC);  Tear of right glenoid labrum; Shoulder joint pain; History of radiation therapy;  Cervical spine pain; Cervical radiculopathy; Cervical spondylosis;  and Degeneration of lumbar intervertebral disc on their problem list. he  has a past medical history of Arthritis,  Non Hodgkin's lymphoma (HCC), and Seizures (as a baby). he  has a past surgical history that includes Wisdom tooth extraction; Lymph node biopsy; and Shoulder arthroscopy with labral repair (Right, 06/14/2017) are also affecting patient's functional outcome.   REHAB POTENTIAL: Good  CLINICAL DECISION MAKING: Evolving/moderate complexity  EVALUATION COMPLEXITY: Moderate   GOALS: Goals reviewed with patient? No  SHORT TERM GOALS: Target date: 02/22/2024  Patient will be independent with initial home exercise program for self-management of symptoms. Baseline: Initial HEP to be provided at visit 2 as appropriate (02/08/24); Goal status: MET   LONG TERM GOALS: Target date: 05/02/2024  Patient will be independent with a long-term home exercise program for self-management of symptoms.  Baseline: Initial HEP to be provided at visit 2 as appropriate (02/08/24); completes HEP daily (03/29/2024);  Goal status: In progress  2.  Patient will demonstrate improved Upper Extremity Functional Scale (UEFS) to equal or greater than 64/80 to demonstrate improvement in overall condition and self-reported functional ability.  Baseline: 34/80 (02/08/24); Goal status: In progress  3.  Patient will demonstrate pain free L UE strength equal or greater to R UE strength to improve his ability to complete his job duties.  Baseline:  decreased and painful (02/08/24); Goal status: In progress  4.  Patient will demonstrate L shoulder AROM/PROM pain free and equal or greater to R shoulder to improve his ability to reach overhead and move into awkward positions while performing his job duties and leisure activities such as basketball and going to the gym.  Baseline: limited in overhead movement and painful (02/08/24); Goal status: In progress  5.  Patient will demonstrate improvement in Patient Specific Functional Scale (PSFS) of equal or greater than 8/10 points to reflect clinically significant improvement in patient's most valued functional activities. Baseline: to be measured at visit 2 as appropriate (02/08/24); 1/10 (02/10/24); did not capture (03/29/24); Goal status: In progress  6.  Patient will report NPRS equal or less than 2/10 during functional activities during the last 2 weeks to improve their abilitly to complete community, work and/or recreational activities with less limitation. Baseline: 8/10 (02/08/24); 5/10 (03/20/2024);  Goal status: In progress   PLAN:  PT FREQUENCY: 1-2x/week  PT DURATION: 12 weeks  PLANNED INTERVENTIONS: 97164- PT Re-evaluation, 97750- Physical Performance Testing, 97110-Therapeutic exercises, 97530- Therapeutic activity, [id number]- Neuromuscular re-education, [phone]- Self Care, [id number]- Manual therapy, [id number]- Electrical stimulation (unattended), [phone]- Traction (mechanical), [phone] (1-2 muscles), 20561 (3+ muscles)- Dry Needling, Patient/Family education, Joint mobilization, Spinal mobilization, Cryotherapy, and Moist heat  PLAN FOR NEXT SESSION: update HEP as appropriate, activity modifications to accommodate mechanical stressors and allow healing, then motor control exercises to teach improved mechanics, progressive loading of the shoulder girdle, and reconditioning of the movements and tasks that previously irritated condition. Manual therapy as needed to restore motion and provide sympotm  modulation.    Maryanne Finder, PT, DPT Physical Therapist - [place]  [facility]

## 2024-04-27 ENCOUNTER — Ambulatory Visit: Admitting: Physical Therapy

## 2024-04-29 ENCOUNTER — Other Ambulatory Visit: Payer: Self-pay | Admitting: Medical Genetics

## 2024-04-29 DIAGNOSIS — Z006 Encounter for examination for normal comparison and control in clinical research program: Secondary | ICD-10-CM

## 2024-05-02 ENCOUNTER — Ambulatory Visit

## 2024-05-02 DIAGNOSIS — M6281 Muscle weakness (generalized): Secondary | ICD-10-CM

## 2024-05-02 DIAGNOSIS — M79642 Pain in left hand: Secondary | ICD-10-CM | POA: Diagnosis not present

## 2024-05-02 DIAGNOSIS — M25512 Pain in left shoulder: Secondary | ICD-10-CM

## 2024-05-02 DIAGNOSIS — M79641 Pain in right hand: Secondary | ICD-10-CM | POA: Diagnosis not present

## 2024-05-02 DIAGNOSIS — M792 Neuralgia and neuritis, unspecified: Secondary | ICD-10-CM | POA: Diagnosis not present

## 2024-05-02 NOTE — Therapy (Signed)
 OUTPATIENT PHYSICAL THERAPY TREATMENT & RE-CERTIFICATION   Patient Name: Thomas Mckay MRN: [id number] DOB:1985-12-19, 38 y.o., male Today's Date: 05/02/2024  END OF SESSION:  PT End of Session - 05/02/24 1303     Visit Number 15    Number of Visits 28    Date for Recertification  06/13/24    Authorization Type [employer]  reporting period from 03/29/2024    Authorization Time Period 01/18/24-02/15/25  VL:max 30 PT vsts per plan year    Authorization - Number of Visits 30    Progress Note Due on Visit 10    PT Start Time 1301    PT Stop Time 1341    PT Time Calculation (min) 40 min    Activity Tolerance Patient tolerated treatment well    Behavior During Therapy WFL for tasks assessed/performed            Past Medical History:  Diagnosis Date   Arthritis    wrists   History of chlamydia    History of herpes genitalis    Non Hodgkin's lymphoma (HCC)    in remission. completed treatments 08/2012   Seizures (HCC)    x1. as infant   Past Surgical History:  Procedure Laterality Date   LYMPH NODE BIOPSY     SHOULDER ARTHROSCOPY WITH LABRAL REPAIR Right 06/14/2017   Procedure: SHOULDER ARTHROSCOPY WITH LABRAL REPAIR and subacromial debridement.;  Surgeon: Marchia Drivers, MD;  Location: [facility];  Service: Orthopedics;  Laterality: Right;   WISDOM TOOTH EXTRACTION     Patient Active Problem List   Diagnosis Date Noted   Knee pain, bilateral 04/28/2022   Anxiety due to invasive procedure 02/19/2022   Degeneration of lumbar intervertebral disc 02/19/2022   Stiffness of right knee 07/03/2021   Cervical spine pain 04/02/2021   Cervical radiculopathy 04/02/2021   Cervical spondylosis 04/02/2021   Strain of neck muscle 04/02/2021   Right knee pain 12/20/2020   Research study patient 12/06/2019   Abdominal pain 02/22/2019   Proteinuria 02/22/2019   Asymptomatic microscopic hematuria 02/22/2019   Pain in joint involving ankle and foot 02/01/2019   Achilles bursitis  02/01/2019   Achilles tendinitis 02/01/2019   Hip pain 02/01/2019   Lumbar sprain 02/01/2019   Sprain of deltoid ligament of ankle 02/01/2019   Sprain of hand 02/01/2019   Pre-bariatric surgery nutrition evaluation 06/03/2017   Gastroesophageal reflux disease 05/24/2017   Type 2 HSV infection of penis 05/24/2017   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   Shoulder joint pain 05/14/2017   Non-Hodgkin lymphoma (HCC) 04/14/2017   Bilateral carpal tunnel syndrome 01/28/2017   Tear of right glenoid labrum 10/09/2014   Diffuse large B-cell lymphoma of lymph nodes of neck (HCC) 08/02/2014   History of antineoplastic chemotherapy 11/22/2012   History of radiation therapy 11/22/2012   Allergic rhinitis 08/18/2007    PCP: Claudene Tanda POUR, PA-C  REFERRING PROVIDER: Kathlynn Sharper, MD  REFERRING DIAG: Left rotator cuff tendinitis  THERAPY DIAG:  Acute pain of left shoulder  Muscle weakness (generalized)  Rationale for Evaluation and Treatment: Rehabilitation  ONSET DATE: June 2025  SUBJECTIVE:  PERTINENT HISTORY: Patient is a 38 y.o. male who presents to outpatient physical therapy with a referral for medical diagnosis left rotator cuff tendinitis. This patient's chief complaints consist of left shoulder pain and arm weakness with intermittent paresthesia to the hand, leading to the following functional deficits: difficulty with all activities that require use of L UE including reaching, carrying, lifting, restraining and holding animals, working, sleeping, housework, peparing food, grooming, pushing, driving, yardwork, dressing, using tools or appliances, laundry, throwing a ball, playing basket ball, going to the gym. Relevant past medical history and comorbidities include the following: he has Pain in joint involving  ankle and foot; Achilles bursitis; Achilles tendinitis; Allergic rhinitis; Bilateral carpal tunnel syndrome; Boutonniere deformity; Diffuse large B-cell lymphoma of lymph nodes of neck (HCC); Gastroesophageal reflux disease; Hip pain; History of antineoplastic chemotherapy; Lumbar sprain; Non-Hodgkin lymphoma (HCC); Sprain of deltoid ligament of ankle; Sprain of hand; Tear of right glenoid labrum; Type 2 HSV infection of penis; Shoulder joint pain; History of radiation therapy; Abdominal pain; Proteinuria; Asymptomatic microscopic hematuria; Pre-bariatric surgery nutrition evaluation; Research study patient; Right knee pain; Cervical spine pain; Cervical radiculopathy; Cervical spondylosis; Strain of neck muscle; Stiffness of right knee; Knee pain, bilateral;  and Degeneration of lumbar intervertebral disc on their problem list. he  has a past medical history of Arthritis, History of chlamydia, History of herpes genitalis, Non Hodgkin's lymphoma (HCC), and Seizures (as a baby). he  has a past surgical history that includes Wisdom tooth extraction; Lymph node biopsy; and Shoulder arthroscopy with labral repair (Right, 06/14/2017). Patient denies hx of stroke, lung problems, heart problems, diabetes, unexplained weight loss, unexplained changes in bowel or bladder problems, unexplained stumbling or dropping things, osteoporosis, and spinal surgery, or left shoulder surgery.   Exercise history: likes to go to the gym and play basketball. Might have a 5#DB. Also has curling bench press thing. He still has a Photographer.   Right handed  SUBJECTIVE STATEMENT: Patient states his left shoulder is doing okay. He got the injection last week at Dr. Kathlynn office. They are concerned about a SLAP tear/biceps tendon pathology. Patient states he did not notice any improvement with the shot. He felt okay after last PT session. He states he has better and worse days. He is discouraged after Dr. Kathlynn suggested surgery as an  option if injection was ineffective.   Top three problems: abduction of the left shoulder (pain and weakness)  PAIN: NPRS: Current: 2/10 L pec and clavicular region  Best: 2-3/10, Worst: 8/10. Pain location: inside left shoulder, worst at anterior shoulder near pec, numbness over his whole left hand (currently there because he was poking the front of his shoulder).  Pain description: dull, Aggravating factors: the more he uses his left shoulder for anything weight wise including holding up kittens that are 2-3 lbs, reaching, sleeping on his left side (does not wake him), housework Relieving factors: as soon as he wakes up, hot bath   PRECAUTIONS: Other: don't lift anything over 10 lbs with left UE   PATIENT GOALS: back to normal hoping it ain't going to tear  NEXT MD VISIT: 5 weeks from last visit with Dr. Kathlynn   OBJECTIVE  TODAY'S TREATMENT: 05/02/24    Subjective: Patient reports waking up Saturday morning with R shoulder and neck stiffness and pain.   Patient's condition has the potential to improve in response to therapy. Maximum improvement is yet to be obtained. The anticipated improvement is attainable and reasonable in a generally predictable time.  UEFS: 50/80 LUE strength compared to R: 4+/5 bilaterally  L UE AROM within pain free: pain only with L shoulder IR  Pain: 6/10 Patient Specific Functional Scale (PSFS)  Basketball: 4/10 Picking up youngest son: 7/10 Being Mckay to use L arm without any difficulties: 1/10 Average: 5/10  Extensive conversation about POC and potential consideration of surgery per patient. Discussed insurance visit limit and need for them if he does elect to have surgery. Patient wanting to extend POC for 6 weeks for continued improvement. PT in agreement.    PATIENT EDUCATION: Education details: Exercise  purpose/form. Self management techniques. Education on diagnosis, prognosis, POC, anatomy and physiology of current condition. Education on imaging.  Person educated: Patient Education method: Explanation Education comprehension: verbalized understanding and needs further education  HOME EXERCISE PROGRAM: Access Code: [email or web address] URL: [email or web address] Date: 03/07/2024 Prepared by: Camie Cleverly  Program Notes [email or web address]   Exercises - Lat Pull with band/cable  - 1 x daily - 2 sets - 10 reps - 5 seconds hold - Prone Shoulder External Rotation with Towel Roll  - 1 x daily - 3 sets - 15 reps  HOME EXERCISE PROGRAM [[id number]]  Prone B shoulder ER for lower traps -  Repeat 20 Repetitions, Hold 5 Seconds, Complete 2 Sets, Perform 1 Times a Day  ASSESSMENT:  CLINICAL IMPRESSION:    Patient seen this date for re-certification. Patient's condition has the potential to improve in response to therapy, however discussed about pain free may not be attainable in current situation. Maximum improvement is yet to be obtained. The anticipated improvement is attainable and reasonable in a generally predictable time. Patient in agreement to extend for 6 weeks at 1-2x/week then discharge from PT services. Patient would benefit from continued management of limiting condition by skilled physical therapist to address remaining impairments and functional limitations to work towards stated goals and return to PLOF or maximal functional independence.   From initial PT evaluation  on 02/08/24:  Patient is a 38 y.o. male referred to outpatient physical therapy with a medical diagnosis of left rotator cuff tendinitis who presents with signs and symptoms consistent with left shoulder pain and L UE weakness and paresthesia following an injury in June. He appears to have myotomal weakness at C5 and C6 that improves with cervical unloading thorough distraction,  suggesting a cervical spine contribution to his condition. This would match with the has left sided upper trap discomfort he experiences with left cervical spine rotation, sidebending, and extension and the intermittent paresthesia to his hand. However, his DTRs appear equal and intact bilaterally. C5 myotomes tested today also correspond to the same motions  most commonly affected by a RTC condition (shoulder ER and abduction). Plan to address RTC and shoulder as well as any cervical spine contributions. Patient has good motion B shoulders except some limited overhead movement in the L. Patient presents with significant pain, ROM, joint stiffness, muscle performance (strength/power/endurance), paresthesia, and activity tolerance impairments that are limiting ability to complete all activities that require use of L UE including reaching, carrying, lifting, restraining and holding animals, working, sleeping, housework, peparing food, grooming, pushing, driving, yardwork, dressing, using tools or appliances, laundry, throwing a ball,  playing basket ball, going to the gym without difficulty. Patient will benefit from skilled physical therapy intervention to address current body structure impairments and activity limitations to improve function and work towards goals set in current POC in order to return to prior level of function or maximal functional improvement.    OBJECTIVE IMPAIRMENTS: decreased activity tolerance, decreased endurance, decreased knowledge of condition, decreased ROM, decreased strength, impaired UE functional use, improper body mechanics, postural dysfunction, and pain.   ACTIVITY LIMITATIONS: carrying, lifting, sleeping, bathing, dressing, reach over head, hygiene/grooming, and caring for others  PARTICIPATION LIMITATIONS: meal prep, cleaning, laundry, interpersonal relationship, driving, shopping, community activity, occupation, and yard work  PERSONAL FACTORS: Profession and 3+  comorbidities:   Allergic rhinitis; Bilateral carpal tunnel syndrome; Boutonniere deformity; Diffuse large B-cell lymphoma of lymph nodes of neck (HCC); Gastroesophageal reflux disease; Hip pain; History of antineoplastic chemotherapy; Lumbar sprain; Non-Hodgkin lymphoma (HCC);  Tear of right glenoid labrum; Shoulder joint pain; History of radiation therapy;  Cervical spine pain; Cervical radiculopathy; Cervical spondylosis;  and Degeneration of lumbar intervertebral disc on their problem list. he  has a past medical history of Arthritis,  Non Hodgkin's lymphoma (HCC), and Seizures (as a baby). he  has a past surgical history that includes Wisdom tooth extraction; Lymph node biopsy; and Shoulder arthroscopy with labral repair (Right, 06/14/2017) are also affecting patient's functional outcome.   REHAB POTENTIAL: Good  CLINICAL DECISION MAKING: Evolving/moderate complexity  EVALUATION COMPLEXITY: Moderate   GOALS: Goals reviewed with patient? No  SHORT TERM GOALS: Target date: 02/22/2024  Patient will be independent with initial home exercise program for self-management of symptoms. Baseline: Initial HEP to be provided at visit 2 as appropriate (02/08/24); Goal status: MET   LONG TERM GOALS: Target date: 05/02/2024  Patient will be independent with a long-term home exercise program for self-management of symptoms.  Baseline: Initial HEP to be provided at visit 2 as appropriate (02/08/24); completes HEP daily (03/29/2024);  Goal status: MET  2.  Patient will demonstrate improved Upper Extremity Functional Scale (UEFS) to equal or greater than 64/80 to demonstrate improvement in overall condition and self-reported functional ability.  Baseline: 34/80 (02/08/24); 05/02/24: 50/80 Goal status: In progress  3.  Patient will demonstrate pain free L UE strength equal or greater to R UE strength to improve his ability to complete his job duties.  Baseline: decreased and painful (02/08/24); 05/02/24:  Bilaterally 4+/5  Goal status: MET  4.  Patient will demonstrate L shoulder AROM/PROM pain free and equal or greater to R shoulder to improve his ability to reach overhead and move into awkward positions while performing his job duties and leisure activities such as basketball and going to the gym.  Baseline: limited in overhead movement and painful (02/08/24); 05/02/24: pain with L shoulder IR  Goal status: In progress  5.  Patient will demonstrate improvement in Patient Specific Functional Scale (PSFS) of equal or greater than 8/10 points to reflect clinically significant improvement in patient's most valued functional activities. Baseline: to be measured at visit 2 as appropriate (02/08/24); 1/10 (02/10/24); did not capture (03/29/24); 05/02/24: 5/10  Goal status: In progress  6.  Patient will report NPRS equal or less than 2/10 during functional activities during the last 2 weeks to improve their abilitly to complete community, work and/or recreational activities with less limitation. Baseline: 8/10 (02/08/24); 5/10 (03/20/2024); 05/02/24: 6/10  Goal status: In progress   PLAN:  PT FREQUENCY: 1-2x/week  PT DURATION: 12 weeks  PLANNED INTERVENTIONS: 02835- PT  Re-evaluation, 97750- Physical Performance Testing, 97110-Therapeutic exercises, 97530- Therapeutic activity, [id number]- Neuromuscular re-education, [phone]- Self Care, 02859- Manual therapy, [id number]- Electrical stimulation (unattended), [phone]- Traction (mechanical), [phone] (1-2 muscles), 20561 (3+ muscles)- Dry Needling, Patient/Family education, Joint mobilization, Spinal mobilization, Cryotherapy, and Moist heat  PLAN FOR NEXT SESSION: update HEP as appropriate, activity modifications to accommodate mechanical stressors and allow healing, then motor control exercises to teach improved mechanics, progressive loading of the shoulder girdle, and reconditioning of the movements and tasks that previously irritated condition. Manual therapy as needed  to restore motion and provide sympotm modulation.    Maryanne Finder, PT, DPT Physical Therapist - [place]  [facility]

## 2024-05-04 ENCOUNTER — Encounter: Payer: Self-pay | Admitting: Physical Therapy

## 2024-05-04 ENCOUNTER — Ambulatory Visit: Admitting: Physical Therapy

## 2024-05-04 DIAGNOSIS — M25512 Pain in left shoulder: Secondary | ICD-10-CM | POA: Diagnosis not present

## 2024-05-04 DIAGNOSIS — M792 Neuralgia and neuritis, unspecified: Secondary | ICD-10-CM

## 2024-05-04 DIAGNOSIS — M79641 Pain in right hand: Secondary | ICD-10-CM | POA: Diagnosis not present

## 2024-05-04 DIAGNOSIS — M79642 Pain in left hand: Secondary | ICD-10-CM | POA: Diagnosis not present

## 2024-05-04 DIAGNOSIS — M6281 Muscle weakness (generalized): Secondary | ICD-10-CM | POA: Diagnosis not present

## 2024-05-04 NOTE — Therapy (Signed)
 OUTPATIENT PHYSICAL THERAPY TREATMENT   Patient Name: Thomas Mckay MRN: [id number] DOB:[birth year], 38 y.o., male Today's Date: 05/04/2024  END OF SESSION:  PT End of Session - 05/04/24 1543     Visit Number 16    Number of Visits 28    Date for Recertification  06/13/24    Authorization Type [employer]  reporting period from 03/29/2024    Authorization Time Period 01/18/24-02/15/25  VL:max 30 PT vsts per plan year    Authorization - Visit Number 14    Authorization - Number of Visits 30    Progress Note Due on Visit 10    PT Start Time 1522    PT Stop Time 1610    PT Time Calculation (min) 48 min    Activity Tolerance Patient tolerated treatment well    Behavior During Therapy WFL for tasks assessed/performed             Past Medical History:  Diagnosis Date   Arthritis    wrists   History of chlamydia    History of herpes genitalis    Non Hodgkin's lymphoma (HCC)    in remission. completed treatments 08/2012   Seizures (HCC)    x1. as infant   Past Surgical History:  Procedure Laterality Date   LYMPH NODE BIOPSY     SHOULDER ARTHROSCOPY WITH LABRAL REPAIR Right 06/14/2017   Procedure: SHOULDER ARTHROSCOPY WITH LABRAL REPAIR and subacromial debridement.;  Surgeon: Marchia Drivers, MD;  Location: [facility];  Service: Orthopedics;  Laterality: Right;   WISDOM TOOTH EXTRACTION     Patient Active Problem List   Diagnosis Date Noted   Knee pain, bilateral 04/28/2022   Anxiety due to invasive procedure 02/19/2022   Degeneration of lumbar intervertebral disc 02/19/2022   Stiffness of right knee 07/03/2021   Cervical spine pain 04/02/2021   Cervical radiculopathy 04/02/2021   Cervical spondylosis 04/02/2021   Strain of neck muscle 04/02/2021   Right knee pain 12/20/2020   Research study patient 12/06/2019   Abdominal pain 02/22/2019   Proteinuria 02/22/2019   Asymptomatic microscopic hematuria 02/22/2019   Pain in joint involving ankle and foot 02/01/2019    Achilles bursitis 02/01/2019   Achilles tendinitis 02/01/2019   Hip pain 02/01/2019   Lumbar sprain 02/01/2019   Sprain of deltoid ligament of ankle 02/01/2019   Sprain of hand 02/01/2019   Pre-bariatric surgery nutrition evaluation 06/03/2017   Gastroesophageal reflux disease 05/24/2017   Type 2 HSV infection of penis 05/24/2017   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   Shoulder joint pain 05/14/2017   Non-Hodgkin lymphoma (HCC) 04/14/2017   Bilateral carpal tunnel syndrome 01/28/2017   Tear of right glenoid labrum 10/09/2014   Diffuse large B-cell lymphoma of lymph nodes of neck (HCC) 08/02/2014   History of antineoplastic chemotherapy 11/22/2012   History of radiation therapy 11/22/2012   Allergic rhinitis 08/18/2007    PCP: Claudene Tanda POUR, PA-C  REFERRING PROVIDER: Kathlynn Sharper, MD  REFERRING DIAG: Left rotator cuff tendinitis  THERAPY DIAG:  Acute pain of left shoulder  Muscle weakness (generalized)  Neuralgia and neuritis  Rationale for Evaluation and Treatment: Rehabilitation  ONSET DATE: June 2025  SUBJECTIVE:  PERTINENT HISTORY: Patient is a 39 y.o. male who presents to outpatient physical therapy with a referral for medical diagnosis left rotator cuff tendinitis. This patient's chief complaints consist of left shoulder pain and arm weakness with intermittent paresthesia to the hand, leading to the following functional deficits: difficulty with all activities that require use of L UE including reaching, carrying, lifting, restraining and holding animals, working, sleeping, housework, peparing food, grooming, pushing, driving, yardwork, dressing, using tools or appliances, laundry, throwing a ball, playing basket ball, going to the gym. Relevant past medical history and comorbidities  include the following: he has Pain in joint involving ankle and foot; Achilles bursitis; Achilles tendinitis; Allergic rhinitis; Bilateral carpal tunnel syndrome; Boutonniere deformity; Diffuse large B-cell lymphoma of lymph nodes of neck (HCC); Gastroesophageal reflux disease; Hip pain; History of antineoplastic chemotherapy; Lumbar sprain; Non-Hodgkin lymphoma (HCC); Sprain of deltoid ligament of ankle; Sprain of hand; Tear of right glenoid labrum; Type 2 HSV infection of penis; Shoulder joint pain; History of radiation therapy; Abdominal pain; Proteinuria; Asymptomatic microscopic hematuria; Pre-bariatric surgery nutrition evaluation; Research study patient; Right knee pain; Cervical spine pain; Cervical radiculopathy; Cervical spondylosis; Strain of neck muscle; Stiffness of right knee; Knee pain, bilateral;  and Degeneration of lumbar intervertebral disc on their problem list. he  has a past medical history of Arthritis, History of chlamydia, History of herpes genitalis, Non Hodgkin's lymphoma (HCC), and Seizures (as a baby). he  has a past surgical history that includes Wisdom tooth extraction; Lymph node biopsy; and Shoulder arthroscopy with labral repair (Right, 06/14/2017). Patient denies hx of stroke, lung problems, heart problems, diabetes, unexplained weight loss, unexplained changes in bowel or bladder problems, unexplained stumbling or dropping things, osteoporosis, and spinal surgery, or left shoulder surgery.   Exercise history: likes to go to the gym and play basketball. Might have a 5#DB. Also has curling bench press thing. He still has a Photographer.   Right handed  SUBJECTIVE STATEMENT: Patient states his left shoulder is doing well and feeling better. Still having problems with carrying things in the L arm. Saturday his R side shoulder, neck were hurting really bad and he was unsure why. It made his concordant pain worse. It is still tight but not as bad as Saturday.   Top three  problems: carrying something with a weight for a couple of minutes, repeatedly opening or closing a heavy door.  PAIN: NPRS: Current: 2/10 L anterior shoulder  Best: 2-3/10, Worst: 8/10. Pain location: inside left shoulder, worst at anterior shoulder near pec, numbness over his whole left hand (currently there because he was poking the front of his shoulder).  Pain description: dull, Aggravating factors: the more he uses his left shoulder for anything weight wise including holding up kittens that are 2-3 lbs, reaching, sleeping on his left side (does not wake him), housework Relieving factors: as soon as he wakes up, hot bath   PRECAUTIONS: Other: don't lift anything over 10 lbs with left UE   PATIENT GOALS: back to normal hoping it ain't going to tear  NEXT MD VISIT: 5 weeks from last visit with Dr. Kathlynn   OBJECTIVE  TREATMENT:  Manual therapy: to reduce pain and tissue tension, improve range of motion, neuromodulation, in order to promote improved ability to complete functional activities. HOOKLYING Intermittent manual cervical spine traction 11x10 seconds on/off  Therapeutic activities: dynamic therapeutic activities incorporating MULTIPLE parameters or areas of the body designed to achieve improved functional performance. Upper body ergometer level 11 to encourage joint nutrition, warm tissue, induce analgesic effect of aerobic exercise, improve muscular strength and endurance,  and prepare for remainder of session. 4 min total, changing directions every 1 min  Shoulder abduction testing before and after manual traction (better after but still discomfort)  Quadruped single arm retraction to horizontal abduction 1x10 each side AROM 1x10 each side with 3#DB (attempted with 4#DB but painful L shoulder, so moved to 3# after first 2 reps).  1x10 each side  with 3#DB on bench instead of plinth  Circuit (with quadruped single arm retractoin to horizontal abduction above after weight added):  Prone on bench, B shoulder ER with chin supported on bench 2x10 with 5 second hold using 2#DB (incomplete ROM)  Seated slightly reclined on weight bench with head resting on bench to offload neck (back positioned 2nd from top)  Biceps curls to improve lifting 1x10 with 10#DB in each hand 1x10 with 15#DB in each hand Head resting on bench  Isometric shoulder ER against band around wrists at 3 positions to improve tolerance for overhead activities Neutral shoulder with 90 degrees elbow flexion 90 degrees shoulder flexion with 90 degrees elbow flexion Overhead reach 2x5x5 second holds each position   Pt required multimodal cuing for proper technique and to facilitate improved neuromuscular control, strength, range of motion, and functional ability resulting in improved performance and form.  PATIENT EDUCATION: Education details: Exercise purpose/form. Self management techniques. Education on diagnosis, prognosis, POC, anatomy and physiology of current condition. Education on imaging.  Person educated: Patient Education method: Explanation Education comprehension: verbalized understanding and needs further education  HOME EXERCISE PROGRAM: Access Code: [email or web address] URL: [email or web address] Date: 03/07/2024 Prepared by: Camie Cleverly  Program Notes [email or web address]   Exercises - Lat Pull with band/cable  - 1 x daily - 2 sets - 10 reps - 5 seconds hold - Prone Shoulder External Rotation with Towel Roll  - 1 x daily - 3 sets - 15 reps  HOME EXERCISE PROGRAM [[id number]]  Prone B shoulder ER for lower traps -  Repeat 20 Repetitions, Hold 5 Seconds, Complete 2 Sets, Perform 1 Times a Day  ASSESSMENT:  CLINICAL IMPRESSION:    Continued with strengthening exercises for shoulder girdle attempting to  avoid heavy loading of upper traps, as pt still gets some relief from cervical spine unloading with manual traction, suggesting that this is contributing to his condition. Patient tolerated well with high effort level and low discomfort. Patient would benefit from continued management of limiting condition by skilled physical therapist to address remaining impairments and functional limitations to work towards stated goals and return to PLOF or maximal functional independence.   From initial PT evaluation  on 02/08/24:  Patient is a 38 y.o. male referred to outpatient physical therapy with a medical diagnosis of left rotator cuff tendinitis who presents with signs and symptoms consistent with left shoulder pain and L UE weakness and paresthesia following an injury in June. He appears to have myotomal weakness at C5 and C6 that improves with cervical unloading thorough distraction, suggesting a cervical spine contribution to his condition. This would match with the has left sided upper trap discomfort he  experiences with left cervical spine rotation, sidebending, and extension and the intermittent paresthesia to his hand. However, his DTRs appear equal and intact bilaterally. C5 myotomes tested today also correspond to the same motions  most commonly affected by a RTC condition (shoulder ER and abduction). Plan to address RTC and shoulder as well as any cervical spine contributions. Patient has good motion B shoulders except some limited overhead movement in the L. Patient presents with significant pain, ROM, joint stiffness, muscle performance (strength/power/endurance), paresthesia, and activity tolerance impairments that are limiting ability to complete all activities that require use of L UE including reaching, carrying, lifting, restraining and holding animals, working, sleeping, housework, peparing food, grooming, pushing, driving, yardwork, dressing, using tools or appliances, laundry, throwing a ball,  playing basket ball, going to the gym without difficulty. Patient will benefit from skilled physical therapy intervention to address current body structure impairments and activity limitations to improve function and work towards goals set in current POC in order to return to prior level of function or maximal functional improvement.    OBJECTIVE IMPAIRMENTS: decreased activity tolerance, decreased endurance, decreased knowledge of condition, decreased ROM, decreased strength, impaired UE functional use, improper body mechanics, postural dysfunction, and pain.   ACTIVITY LIMITATIONS: carrying, lifting, sleeping, bathing, dressing, reach over head, hygiene/grooming, and caring for others  PARTICIPATION LIMITATIONS: meal prep, cleaning, laundry, interpersonal relationship, driving, shopping, community activity, occupation, and yard work  PERSONAL FACTORS: Profession and 3+ comorbidities:   Allergic rhinitis; Bilateral carpal tunnel syndrome; Boutonniere deformity; Diffuse large B-cell lymphoma of lymph nodes of neck (HCC); Gastroesophageal reflux disease; Hip pain; History of antineoplastic chemotherapy; Lumbar sprain; Non-Hodgkin lymphoma (HCC);  Tear of right glenoid labrum; Shoulder joint pain; History of radiation therapy;  Cervical spine pain; Cervical radiculopathy; Cervical spondylosis;  and Degeneration of lumbar intervertebral disc on their problem list. he  has a past medical history of Arthritis,  Non Hodgkin's lymphoma (HCC), and Seizures (as a baby). he  has a past surgical history that includes Wisdom tooth extraction; Lymph node biopsy; and Shoulder arthroscopy with labral repair (Right, 06/14/2017) are also affecting patient's functional outcome.   REHAB POTENTIAL: Good  CLINICAL DECISION MAKING: Evolving/moderate complexity  EVALUATION COMPLEXITY: Moderate   GOALS: Goals reviewed with patient? No  SHORT TERM GOALS: Target date: 02/22/2024  Patient will be independent with initial  home exercise program for self-management of symptoms. Baseline: Initial HEP to be provided at visit 2 as appropriate (02/08/24); Goal status: MET   LONG TERM GOALS: Target date: 05/02/2024  Patient will be independent with a long-term home exercise program for self-management of symptoms.  Baseline: Initial HEP to be provided at visit 2 as appropriate (02/08/24); completes HEP daily (03/29/2024);  Goal status: MET  2.  Patient will demonstrate improved Upper Extremity Functional Scale (UEFS) to equal or greater than 64/80 to demonstrate improvement in overall condition and self-reported functional ability.  Baseline: 34/80 (02/08/24); 05/02/24: 50/80 Goal status: In progress  3.  Patient will demonstrate pain free L UE strength equal or greater to R UE strength to improve his ability to complete his job duties.  Baseline: decreased and painful (02/08/24); 05/02/24: Bilaterally 4+/5  Goal status: MET  4.  Patient will demonstrate L shoulder AROM/PROM pain free and equal or greater to R shoulder to improve his ability to reach overhead and move into awkward positions while performing his job duties and leisure activities such as basketball and going to the gym.  Baseline: limited in overhead movement and painful (  02/08/24); 05/02/24: pain with L shoulder IR  Goal status: In progress  5.  Patient will demonstrate improvement in Patient Specific Functional Scale (PSFS) of equal or greater than 8/10 points to reflect clinically significant improvement in patient's most valued functional activities. Baseline: to be measured at visit 2 as appropriate (02/08/24); 1/10 (02/10/24); did not capture (03/29/24); 05/02/24: 5/10  Goal status: In progress  6.  Patient will report NPRS equal or less than 2/10 during functional activities during the last 2 weeks to improve their abilitly to complete community, work and/or recreational activities with less limitation. Baseline: 8/10 (02/08/24); 5/10  (03/20/2024); 05/02/24: 6/10  Goal status: In progress   PLAN:  PT FREQUENCY: 1-2x/week  PT DURATION: 12 weeks  PLANNED INTERVENTIONS: 97164- PT Re-evaluation, 97750- Physical Performance Testing, 97110-Therapeutic exercises, 97530- Therapeutic activity, [id number]- Neuromuscular re-education, 97535- Self Care, [id number]- Manual therapy, [id number]- Electrical stimulation (unattended), [phone]- Traction (mechanical), 20560 (1-2 muscles), 20561 (3+ muscles)- Dry Needling, Patient/Family education, Joint mobilization, Spinal mobilization, Cryotherapy, and Moist heat  PLAN FOR NEXT SESSION: update HEP as appropriate, activity modifications to accommodate mechanical stressors and allow healing, then motor control exercises to teach improved mechanics, progressive loading of the shoulder girdle, and reconditioning of the movements and tasks that previously irritated condition. Manual therapy as needed to restore motion and provide sympotm modulation.   Camie SAUNDERS. Juli, PT, DPT, Cert. MDT 05/04/24, 4:30 PM  [facility] [address] [place], [place] [address] P: [phone] I F: [phone]

## 2024-05-08 ENCOUNTER — Ambulatory Visit: Admitting: Physical Therapy

## 2024-05-08 ENCOUNTER — Encounter: Payer: Self-pay | Admitting: Physical Therapy

## 2024-05-08 DIAGNOSIS — M6281 Muscle weakness (generalized): Secondary | ICD-10-CM | POA: Diagnosis not present

## 2024-05-08 DIAGNOSIS — M25512 Pain in left shoulder: Secondary | ICD-10-CM

## 2024-05-08 DIAGNOSIS — M79641 Pain in right hand: Secondary | ICD-10-CM | POA: Diagnosis not present

## 2024-05-08 DIAGNOSIS — M79642 Pain in left hand: Secondary | ICD-10-CM | POA: Diagnosis not present

## 2024-05-08 DIAGNOSIS — M792 Neuralgia and neuritis, unspecified: Secondary | ICD-10-CM

## 2024-05-08 NOTE — Therapy (Signed)
 OUTPATIENT PHYSICAL THERAPY TREATMENT   Patient Name: Thomas Mckay MRN: [id number] DOB:1986-03-19, 38 y.o., male Today's Date: 05/08/2024  END OF SESSION:  PT End of Session - 05/08/24 1659     Visit Number 17    Number of Visits 28    Date for Recertification  06/13/24    Authorization Type [employer]  reporting period from 03/29/2024    Authorization Time Period 01/18/24-02/15/25  VL:max 30 PT vsts per plan year    Authorization - Number of Visits 30    Progress Note Due on Visit 10    PT Start Time 1653    PT Stop Time 1735    PT Time Calculation (min) 42 min    Activity Tolerance Patient tolerated treatment well    Behavior During Therapy WFL for tasks assessed/performed              Past Medical History:  Diagnosis Date   Arthritis    wrists   History of chlamydia    History of herpes genitalis    Non Hodgkin's lymphoma (HCC)    in remission. completed treatments 08/2012   Seizures (HCC)    x1. as infant   Past Surgical History:  Procedure Laterality Date   LYMPH NODE BIOPSY     SHOULDER ARTHROSCOPY WITH LABRAL REPAIR Right 06/14/2017   Procedure: SHOULDER ARTHROSCOPY WITH LABRAL REPAIR and subacromial debridement.;  Surgeon: Marchia Drivers, MD;  Location: [facility];  Service: Orthopedics;  Laterality: Right;   WISDOM TOOTH EXTRACTION     Patient Active Problem List   Diagnosis Date Noted   Knee pain, bilateral 04/28/2022   Anxiety due to invasive procedure 02/19/2022   Degeneration of lumbar intervertebral disc 02/19/2022   Stiffness of right knee 07/03/2021   Cervical spine pain 04/02/2021   Cervical radiculopathy 04/02/2021   Cervical spondylosis 04/02/2021   Strain of neck muscle 04/02/2021   Right knee pain 12/20/2020   Research study patient 12/06/2019   Abdominal pain 02/22/2019   Proteinuria 02/22/2019   Asymptomatic microscopic hematuria 02/22/2019   Pain in joint involving ankle and foot 02/01/2019   Achilles bursitis 02/01/2019    Achilles tendinitis 02/01/2019   Hip pain 02/01/2019   Lumbar sprain 02/01/2019   Sprain of deltoid ligament of ankle 02/01/2019   Sprain of hand 02/01/2019   Pre-bariatric surgery nutrition evaluation 06/03/2017   Gastroesophageal reflux disease 05/24/2017   Type 2 HSV infection of penis 05/24/2017   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   Shoulder joint pain 05/14/2017   Non-Hodgkin lymphoma (HCC) 04/14/2017   Bilateral carpal tunnel syndrome 01/28/2017   Tear of right glenoid labrum 10/09/2014   Diffuse large B-cell lymphoma of lymph nodes of neck (HCC) 08/02/2014   History of antineoplastic chemotherapy 11/22/2012   History of radiation therapy 11/22/2012   Allergic rhinitis 08/18/2007    PCP: Claudene Tanda POUR, PA-C  REFERRING PROVIDER: Kathlynn Sharper, MD  REFERRING DIAG: Left rotator cuff tendinitis  THERAPY DIAG:  Acute pain of left shoulder  Muscle weakness (generalized)  Neuralgia and neuritis  Rationale for Evaluation and Treatment: Rehabilitation  ONSET DATE: June 2025  SUBJECTIVE:  PERTINENT HISTORY: Patient is a 38 y.o. male who presents to outpatient physical therapy with a referral for medical diagnosis left rotator cuff tendinitis. This patient's chief complaints consist of left shoulder pain and arm weakness with intermittent paresthesia to the hand, leading to the following functional deficits: difficulty with all activities that require use of L UE including reaching, carrying, lifting, restraining and holding animals, working, sleeping, housework, peparing food, grooming, pushing, driving, yardwork, dressing, using tools or appliances, laundry, throwing a ball, playing basket ball, going to the gym. Relevant past medical history and comorbidities include the following: he has Pain in  joint involving ankle and foot; Achilles bursitis; Achilles tendinitis; Allergic rhinitis; Bilateral carpal tunnel syndrome; Boutonniere deformity; Diffuse large B-cell lymphoma of lymph nodes of neck (HCC); Gastroesophageal reflux disease; Hip pain; History of antineoplastic chemotherapy; Lumbar sprain; Non-Hodgkin lymphoma (HCC); Sprain of deltoid ligament of ankle; Sprain of hand; Tear of right glenoid labrum; Type 2 HSV infection of penis; Shoulder joint pain; History of radiation therapy; Abdominal pain; Proteinuria; Asymptomatic microscopic hematuria; Pre-bariatric surgery nutrition evaluation; Research study patient; Right knee pain; Cervical spine pain; Cervical radiculopathy; Cervical spondylosis; Strain of neck muscle; Stiffness of right knee; Knee pain, bilateral;  and Degeneration of lumbar intervertebral disc on their problem list. he  has a past medical history of Arthritis, History of chlamydia, History of herpes genitalis, Non Hodgkin's lymphoma (HCC), and Seizures (as a baby). he  has a past surgical history that includes Wisdom tooth extraction; Lymph node biopsy; and Shoulder arthroscopy with labral repair (Right, 06/14/2017). Patient denies hx of stroke, lung problems, heart problems, diabetes, unexplained weight loss, unexplained changes in bowel or bladder problems, unexplained stumbling or dropping things, osteoporosis, and spinal surgery, or left shoulder surgery.   Exercise history: likes to go to the gym and play basketball. Might have a 5#DB. Also has curling bench press thing. He still has a Photographer.   Right handed  SUBJECTIVE STATEMENT: Patient states he had tolerable elevated pain for about a day after last PT session, but he feels better overall. He states he did his HEP at least a day since last PT session   Top three problems: carrying something with a weight for a couple of minutes especially if elbow flexed, repeatedly opening or closing a heavy door.  PAIN: NPRS:  Current: 2/10 L anterior shoulder  Best: 2-3/10, Worst: 8/10. Pain location: inside left shoulder, worst at anterior shoulder near pec, numbness over his whole left hand (currently there because he was poking the front of his shoulder).  Pain description: dull, Aggravating factors: the more he uses his left shoulder for anything weight wise including holding up kittens that are 2-3 lbs, reaching, sleeping on his left side (does not wake him), housework Relieving factors: as soon as he wakes up, hot bath   PRECAUTIONS: Other: don't lift anything over 10 lbs with left UE   PATIENT GOALS: back to normal hoping it ain't going to tear  NEXT MD VISIT: 5 weeks from last visit with Dr. Kathlynn   OBJECTIVE  TREATMENT:  Therapeutic activities: dynamic therapeutic activities incorporating MULTIPLE parameters or areas of the body designed to achieve improved functional performance.  Upper body ergometer level 11 to encourage joint nutrition, warm tissue, induce analgesic effect of aerobic exercise, improve muscular strength and endurance,  and prepare for remainder of session. 4 min total, changing directions every 1 min Goal RPM in the 20s  Seated lat pull 1x10 with 75#  Circuit Quadruped single arm retraction to horizontal abduction at bench 3x12 each side with 3#DB   Prone on bench, B shoulder ER with chin supported on bench 3x10 with 5 second hold using 2#DB (incomplete ROM)  Seated slightly reclined on weight bench with head resting on bench to offload neck (back positioned 2nd from top)  Biceps curls to improve lifting 3x12 with 15#DB in each hand Head resting on bench  Isometric shoulder ER against band around wrists at 3 positions to improve tolerance for overhead activities Neutral shoulder with 90 degrees elbow flexion 90 degrees shoulder flexion with  90 degrees elbow flexion Overhead reach 3x5 with 5 second holds each position with RTB  Pt required multimodal cuing for proper technique and to facilitate improved neuromuscular control, strength, range of motion, and functional ability resulting in improved performance and form.  PATIENT EDUCATION: Education details: Exercise purpose/form. Self management techniques. Education on diagnosis, prognosis, POC, anatomy and physiology of current condition. Education on imaging.  Person educated: Patient Education method: Explanation Education comprehension: verbalized understanding and needs further education  HOME EXERCISE PROGRAM: Access Code: [email or web address] URL: [email or web address] Date: 03/07/2024 Prepared by: Camie Cleverly  Program Notes [email or web address]   Exercises - Lat Pull with band/cable  - 1 x daily - 2 sets - 10 reps - 5 seconds hold - Prone Shoulder External Rotation with Towel Roll  - 1 x daily - 3 sets - 15 reps  HOME EXERCISE PROGRAM [[email or web address]]  Prone B shoulder ER for lower traps -  Repeat 20 Repetitions, Hold 5 Seconds, Complete 2 Sets, Perform 1 Times a Day  ASSESSMENT:  CLINICAL IMPRESSION:    Continued with focus on improving shoulder girdle strength without overactivation of B UT. Patient continued to need cuing for correct form and reported strong shoulder fatigue. No change in pain by end of session. Patient would benefit from continued management of limiting condition by skilled physical therapist to address remaining impairments and functional limitations to work towards stated goals and return to PLOF or maximal functional independence.    From initial PT evaluation  on 02/08/24:  Patient is a 38 y.o. male referred to outpatient physical therapy with a medical diagnosis of left rotator cuff tendinitis who presents with signs and symptoms consistent with left shoulder pain and L UE weakness and paresthesia  following an injury in June. He appears to have myotomal weakness at C5 and C6 that improves with cervical unloading thorough distraction, suggesting a cervical spine contribution to his condition. This would match with the has left sided upper trap discomfort he experiences with left cervical spine rotation, sidebending, and extension and the intermittent paresthesia to his hand. However, his DTRs appear equal and intact bilaterally. C5 myotomes tested today also correspond to the same motions  most commonly affected by a RTC condition (shoulder ER and abduction). Plan to address RTC and shoulder as well as any cervical spine contributions. Patient has good motion B shoulders except some limited overhead movement in the L. Patient presents with significant pain, ROM, joint stiffness, muscle performance (strength/power/endurance), paresthesia, and activity tolerance  impairments that are limiting ability to complete all activities that require use of L UE including reaching, carrying, lifting, restraining and holding animals, working, sleeping, housework, peparing food, grooming, pushing, driving, yardwork, dressing, using tools or appliances, laundry, throwing a ball, playing basket ball, going to the gym without difficulty. Patient will benefit from skilled physical therapy intervention to address current body structure impairments and activity limitations to improve function and work towards goals set in current POC in order to return to prior level of function or maximal functional improvement.    OBJECTIVE IMPAIRMENTS: decreased activity tolerance, decreased endurance, decreased knowledge of condition, decreased ROM, decreased strength, impaired UE functional use, improper body mechanics, postural dysfunction, and pain.   ACTIVITY LIMITATIONS: carrying, lifting, sleeping, bathing, dressing, reach over head, hygiene/grooming, and caring for others  PARTICIPATION LIMITATIONS: meal prep, cleaning, laundry,  interpersonal relationship, driving, shopping, community activity, occupation, and yard work  PERSONAL FACTORS: Profession and 3+ comorbidities:   Allergic rhinitis; Bilateral carpal tunnel syndrome; Boutonniere deformity; Diffuse large B-cell lymphoma of lymph nodes of neck (HCC); Gastroesophageal reflux disease; Hip pain; History of antineoplastic chemotherapy; Lumbar sprain; Non-Hodgkin lymphoma (HCC);  Tear of right glenoid labrum; Shoulder joint pain; History of radiation therapy;  Cervical spine pain; Cervical radiculopathy; Cervical spondylosis;  and Degeneration of lumbar intervertebral disc on their problem list. he  has a past medical history of Arthritis,  Non Hodgkin's lymphoma (HCC), and Seizures (as a baby). he  has a past surgical history that includes Wisdom tooth extraction; Lymph node biopsy; and Shoulder arthroscopy with labral repair (Right, 06/14/2017) are also affecting patient's functional outcome.   REHAB POTENTIAL: Good  CLINICAL DECISION MAKING: Evolving/moderate complexity  EVALUATION COMPLEXITY: Moderate   GOALS: Goals reviewed with patient? No  SHORT TERM GOALS: Target date: 02/22/2024  Patient will be independent with initial home exercise program for self-management of symptoms. Baseline: Initial HEP to be provided at visit 2 as appropriate (02/08/24); Goal status: MET   LONG TERM GOALS: Target date: 05/02/2024  Patient will be independent with a long-term home exercise program for self-management of symptoms.  Baseline: Initial HEP to be provided at visit 2 as appropriate (02/08/24); completes HEP daily (03/29/2024);  Goal status: MET  2.  Patient will demonstrate improved Upper Extremity Functional Scale (UEFS) to equal or greater than 64/80 to demonstrate improvement in overall condition and self-reported functional ability.  Baseline: 34/80 (02/08/24); 05/02/24: 50/80 Goal status: In progress  3.  Patient will demonstrate pain free L UE strength equal or  greater to R UE strength to improve his ability to complete his job duties.  Baseline: decreased and painful (02/08/24); 05/02/24: Bilaterally 4+/5  Goal status: MET  4.  Patient will demonstrate L shoulder AROM/PROM pain free and equal or greater to R shoulder to improve his ability to reach overhead and move into awkward positions while performing his job duties and leisure activities such as basketball and going to the gym.  Baseline: limited in overhead movement and painful (02/08/24); 05/02/24: pain with L shoulder IR  Goal status: In progress  5.  Patient will demonstrate improvement in Patient Specific Functional Scale (PSFS) of equal or greater than 8/10 points to reflect clinically significant improvement in patient's most valued functional activities. Baseline: to be measured at visit 2 as appropriate (02/08/24); 1/10 (02/10/24); did not capture (03/29/24); 05/02/24: 5/10  Goal status: In progress  6.  Patient will report NPRS equal or less than 2/10 during functional activities during the last 2 weeks to improve  their abilitly to complete community, work and/or recreational activities with less limitation. Baseline: 8/10 (02/08/24); 5/10 (03/20/2024); 05/02/24: 6/10  Goal status: In progress   PLAN:  PT FREQUENCY: 1-2x/week  PT DURATION: 12 weeks  PLANNED INTERVENTIONS: 97164- PT Re-evaluation, 97750- Physical Performance Testing, 97110-Therapeutic exercises, 97530- Therapeutic activity, [id number]- Neuromuscular re-education, 97535- Self Care, [id number]- Manual therapy, [id number]- Electrical stimulation (unattended), [phone]- Traction (mechanical), 20560 (1-2 muscles), 20561 (3+ muscles)- Dry Needling, Patient/Family education, Joint mobilization, Spinal mobilization, Cryotherapy, and Moist heat  PLAN FOR NEXT SESSION: update HEP as appropriate, activity modifications to accommodate mechanical stressors and allow healing, then motor control exercises to teach improved mechanics, progressive loading  of the shoulder girdle, and reconditioning of the movements and tasks that previously irritated condition. Manual therapy as needed to restore motion and provide sympotm modulation.   Camie SAUNDERS. Juli, PT, DPT, Cert. MDT 05/08/24, 5:58 PM  [facility] [address] [place], [place] [address] P: [phone] I F: [phone]

## 2024-05-11 ENCOUNTER — Ambulatory Visit: Admitting: Physical Therapy

## 2024-05-11 ENCOUNTER — Encounter: Payer: Self-pay | Admitting: Physical Therapy

## 2024-05-11 DIAGNOSIS — M792 Neuralgia and neuritis, unspecified: Secondary | ICD-10-CM | POA: Diagnosis not present

## 2024-05-11 DIAGNOSIS — M25512 Pain in left shoulder: Secondary | ICD-10-CM | POA: Diagnosis not present

## 2024-05-11 DIAGNOSIS — M79641 Pain in right hand: Secondary | ICD-10-CM | POA: Diagnosis not present

## 2024-05-11 DIAGNOSIS — M6281 Muscle weakness (generalized): Secondary | ICD-10-CM | POA: Diagnosis not present

## 2024-05-11 DIAGNOSIS — M79642 Pain in left hand: Secondary | ICD-10-CM | POA: Diagnosis not present

## 2024-05-11 NOTE — Therapy (Signed)
 OUTPATIENT PHYSICAL THERAPY TREATMENT   Patient Name: Thomas Mckay MRN: [id number] DOB:26-Dec-1985, 38 y.o., male Today's Date: 05/11/2024  END OF SESSION:  PT End of Session - 05/11/24 1705     Visit Number 18    Number of Visits 28    Date for Recertification  06/13/24    Authorization Type [employer]  reporting period from 03/29/2024    Authorization Time Period 01/18/24-02/15/25  VL:max 30 PT vsts per plan year    Authorization - Visit Number 18    Authorization - Number of Visits 30    Progress Note Due on Visit 10    PT Start Time 1649    PT Stop Time 1727    PT Time Calculation (min) 38 min    Activity Tolerance Patient tolerated treatment well    Behavior During Therapy WFL for tasks assessed/performed               Past Medical History:  Diagnosis Date   Arthritis    wrists   History of chlamydia    History of herpes genitalis    Non Hodgkin's lymphoma (HCC)    in remission. completed treatments 08/2012   Seizures (HCC)    x1. as infant   Past Surgical History:  Procedure Laterality Date   LYMPH NODE BIOPSY     SHOULDER ARTHROSCOPY WITH LABRAL REPAIR Right 06/14/2017   Procedure: SHOULDER ARTHROSCOPY WITH LABRAL REPAIR and subacromial debridement.;  Surgeon: Marchia Drivers, MD;  Location: [facility];  Service: Orthopedics;  Laterality: Right;   WISDOM TOOTH EXTRACTION     Patient Active Problem List   Diagnosis Date Noted   Knee pain, bilateral 04/28/2022   Anxiety due to invasive procedure 02/19/2022   Degeneration of lumbar intervertebral disc 02/19/2022   Stiffness of right knee 07/03/2021   Cervical spine pain 04/02/2021   Cervical radiculopathy 04/02/2021   Cervical spondylosis 04/02/2021   Strain of neck muscle 04/02/2021   Right knee pain 12/20/2020   Research study patient 12/06/2019   Abdominal pain 02/22/2019   Proteinuria 02/22/2019   Asymptomatic microscopic hematuria 02/22/2019   Pain in joint involving ankle and foot  02/01/2019   Achilles bursitis 02/01/2019   Achilles tendinitis 02/01/2019   Hip pain 02/01/2019   Lumbar sprain 02/01/2019   Sprain of deltoid ligament of ankle 02/01/2019   Sprain of hand 02/01/2019   Pre-bariatric surgery nutrition evaluation 06/03/2017   Gastroesophageal reflux disease 05/24/2017   Type 2 HSV infection of penis 05/24/2017   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   Shoulder joint pain 05/14/2017   Non-Hodgkin lymphoma (HCC) 04/14/2017   Bilateral carpal tunnel syndrome 01/28/2017   Tear of right glenoid labrum 10/09/2014   Diffuse large B-cell lymphoma of lymph nodes of neck (HCC) 08/02/2014   History of antineoplastic chemotherapy 11/22/2012   History of radiation therapy 11/22/2012   Allergic rhinitis 08/18/2007    PCP: Claudene Tanda POUR, PA-C  REFERRING PROVIDER: Kathlynn Sharper, MD  REFERRING DIAG: Left rotator cuff tendinitis  THERAPY DIAG:  Acute pain of left shoulder  Muscle weakness (generalized)  Neuralgia and neuritis  Rationale for Evaluation and Treatment: Rehabilitation  ONSET DATE: June 2025  SUBJECTIVE:  PERTINENT HISTORY: Patient is a 38 y.o. male who presents to outpatient physical therapy with a referral for medical diagnosis left rotator cuff tendinitis. This patient's chief complaints consist of left shoulder pain and arm weakness with intermittent paresthesia to the hand, leading to the following functional deficits: difficulty with all activities that require use of L UE including reaching, carrying, lifting, restraining and holding animals, working, sleeping, housework, peparing food, grooming, pushing, driving, yardwork, dressing, using tools or appliances, laundry, throwing a ball, playing basket ball, going to the gym. Relevant past medical history and  comorbidities include the following: he has Pain in joint involving ankle and foot; Achilles bursitis; Achilles tendinitis; Allergic rhinitis; Bilateral carpal tunnel syndrome; Boutonniere deformity; Diffuse large B-cell lymphoma of lymph nodes of neck (HCC); Gastroesophageal reflux disease; Hip pain; History of antineoplastic chemotherapy; Lumbar sprain; Non-Hodgkin lymphoma (HCC); Sprain of deltoid ligament of ankle; Sprain of hand; Tear of right glenoid labrum; Type 2 HSV infection of penis; Shoulder joint pain; History of radiation therapy; Abdominal pain; Proteinuria; Asymptomatic microscopic hematuria; Pre-bariatric surgery nutrition evaluation; Research study patient; Right knee pain; Cervical spine pain; Cervical radiculopathy; Cervical spondylosis; Strain of neck muscle; Stiffness of right knee; Knee pain, bilateral;  and Degeneration of lumbar intervertebral disc on their problem list. he  has a past medical history of Arthritis, History of chlamydia, History of herpes genitalis, Non Hodgkin's lymphoma (HCC), and Seizures (as a baby). he  has a past surgical history that includes Wisdom tooth extraction; Lymph node biopsy; and Shoulder arthroscopy with labral repair (Right, 06/14/2017). Patient denies hx of stroke, lung problems, heart problems, diabetes, unexplained weight loss, unexplained changes in bowel or bladder problems, unexplained stumbling or dropping things, osteoporosis, and spinal surgery, or left shoulder surgery.   Exercise history: likes to go to the gym and play basketball. Might have a 5#DB. Also has curling bench press thing. He still has a Photographer.   Right handed  SUBJECTIVE STATEMENT: Patient states his shoulder is doing well. He was sore over the top of his left shoulder and some tightness in his biceps after last PT session but it went away.   Top three problems: carrying something with a weight for a couple of minutes especially if elbow flexed, repeatedly opening  or closing a heavy door.  PAIN: NPRS: Current: 2/10 L anterior shoulder  Best: 2-3/10, Worst: 8/10. Pain location: inside left shoulder, worst at anterior shoulder near pec, numbness over his whole left hand (currently there because he was poking the front of his shoulder).  Pain description: dull, Aggravating factors: the more he uses his left shoulder for anything weight wise including holding up kittens that are 2-3 lbs, reaching, sleeping on his left side (does not wake him), housework Relieving factors: as soon as he wakes up, hot bath   PRECAUTIONS: Other: don't lift anything over 10 lbs with left UE   PATIENT GOALS: back to normal hoping it ain't going to tear  NEXT MD VISIT: 5 weeks from last visit with Dr. Kathlynn   OBJECTIVE  TREATMENT:  Neuromuscular Re-education: a technique or exercise performed with the goal of improving the level of communication between the body and the brain, such as for balance, motor control, muscle activation patterns, coordination, desensitization, quality of muscle contraction, proprioception, and/or kinesthetic sense needed for successful and safe completion of functional activities.   Standing body blade bounces in ER/IR with elbow flexed to 90 degrees and shoulder in neutral and towel roll under arm to improve shoulder stablity 1x30 seconds each side with short blade 2x30 seconds each side with long body blade  Therapeutic activities: dynamic therapeutic activities incorporating MULTIPLE parameters or areas of the body designed to achieve improved functional performance.  Seated lat pull 1x15 with 65#  Circuit Quadruped single arm retraction to horizontal abduction at bench 1x15 each side with 4#DB  2x15 each side with 5#DB  Prone on bench, B shoulder ER with chin supported on bench 3x10 with 5 second hold using  2#DB (incomplete ROM)  Seated slightly reclined on weight bench with head resting on bench to offload neck (back positioned 2nd from top)  Biceps curls to improve lifting 3x15 with 15#DB in each hand Head resting on bench  Isometric shoulder ER against band around wrists at 3 positions to improve tolerance for overhead activities Neutral shoulder with 90 degrees elbow flexion 90 degrees shoulder flexion with 90 degrees elbow flexion Overhead reach 3x5 with 5 second holds each position with GTB  Pt required multimodal cuing for proper technique and to facilitate improved neuromuscular control, strength, range of motion, and functional ability resulting in improved performance and form.  PATIENT EDUCATION: Education details: Exercise purpose/form. Self management techniques. Education on diagnosis, prognosis, POC, anatomy and physiology of current condition. Education on imaging.  Person educated: Patient Education method: Explanation Education comprehension: verbalized understanding and needs further education  HOME EXERCISE PROGRAM: Access Code: [email or web address] URL: [email or web address] Date: 03/07/2024 Prepared by: Camie Cleverly  Program Notes [email or web address]   Exercises - Lat Pull with band/cable  - 1 x daily - 2 sets - 10 reps - 5 seconds hold - Prone Shoulder External Rotation with Towel Roll  - 1 x daily - 3 sets - 15 reps  HOME EXERCISE PROGRAM [[email or web address]]  Prone B shoulder ER for lower traps -  Repeat 20 Repetitions, Hold 5 Seconds, Complete 2 Sets, Perform 1 Times a Day  ASSESSMENT:  CLINICAL IMPRESSION:    Continued with training focus on improving shoulder girdle and UE strength. Patient continues to have low level of left shoulder pain that is not worse with exercises. Able to progress slightly in load or reps for most exercises. Still needs mild cuing for best form form. Patient would benefit from continued  management of limiting condition by skilled physical therapist to address remaining impairments and functional limitations to work towards stated goals and return to PLOF or maximal functional independence.    From initial PT evaluation  on 02/08/24:  Patient is a 38 y.o. male referred to outpatient physical therapy with a medical diagnosis of left rotator cuff tendinitis who presents with signs and symptoms consistent with left shoulder pain and L UE weakness and paresthesia following an injury in June. He appears to have myotomal weakness at C5 and C6 that improves with cervical unloading thorough distraction, suggesting a cervical spine contribution to his condition. This would match with the has left sided upper trap discomfort he experiences with left cervical spine rotation, sidebending, and extension and the intermittent paresthesia to his hand. However, his DTRs appear equal  and intact bilaterally. C5 myotomes tested today also correspond to the same motions  most commonly affected by a RTC condition (shoulder ER and abduction). Plan to address RTC and shoulder as well as any cervical spine contributions. Patient has good motion B shoulders except some limited overhead movement in the L. Patient presents with significant pain, ROM, joint stiffness, muscle performance (strength/power/endurance), paresthesia, and activity tolerance impairments that are limiting ability to complete all activities that require use of L UE including reaching, carrying, lifting, restraining and holding animals, working, sleeping, housework, peparing food, grooming, pushing, driving, yardwork, dressing, using tools or appliances, laundry, throwing a ball, playing basket ball, going to the gym without difficulty. Patient will benefit from skilled physical therapy intervention to address current body structure impairments and activity limitations to improve function and work towards goals set in current POC in order to return to  prior level of function or maximal functional improvement.    OBJECTIVE IMPAIRMENTS: decreased activity tolerance, decreased endurance, decreased knowledge of condition, decreased ROM, decreased strength, impaired UE functional use, improper body mechanics, postural dysfunction, and pain.   ACTIVITY LIMITATIONS: carrying, lifting, sleeping, bathing, dressing, reach over head, hygiene/grooming, and caring for others  PARTICIPATION LIMITATIONS: meal prep, cleaning, laundry, interpersonal relationship, driving, shopping, community activity, occupation, and yard work  PERSONAL FACTORS: Profession and 3+ comorbidities:   Allergic rhinitis; Bilateral carpal tunnel syndrome; Boutonniere deformity; Diffuse large B-cell lymphoma of lymph nodes of neck (HCC); Gastroesophageal reflux disease; Hip pain; History of antineoplastic chemotherapy; Lumbar sprain; Non-Hodgkin lymphoma (HCC);  Tear of right glenoid labrum; Shoulder joint pain; History of radiation therapy;  Cervical spine pain; Cervical radiculopathy; Cervical spondylosis;  and Degeneration of lumbar intervertebral disc on their problem list. he  has a past medical history of Arthritis,  Non Hodgkin's lymphoma (HCC), and Seizures (as a baby). he  has a past surgical history that includes Wisdom tooth extraction; Lymph node biopsy; and Shoulder arthroscopy with labral repair (Right, 06/14/2017) are also affecting patient's functional outcome.   REHAB POTENTIAL: Good  CLINICAL DECISION MAKING: Evolving/moderate complexity  EVALUATION COMPLEXITY: Moderate   GOALS: Goals reviewed with patient? No  SHORT TERM GOALS: Target date: 02/22/2024  Patient will be independent with initial home exercise program for self-management of symptoms. Baseline: Initial HEP to be provided at visit 2 as appropriate (02/08/24); Goal status: MET   LONG TERM GOALS: Target date: 05/02/2024  Patient will be independent with a long-term home exercise program for  self-management of symptoms.  Baseline: Initial HEP to be provided at visit 2 as appropriate (02/08/24); completes HEP daily (03/29/2024);  Goal status: MET  2.  Patient will demonstrate improved Upper Extremity Functional Scale (UEFS) to equal or greater than 64/80 to demonstrate improvement in overall condition and self-reported functional ability.  Baseline: 34/80 (02/08/24); 05/02/24: 50/80 Goal status: In progress  3.  Patient will demonstrate pain free L UE strength equal or greater to R UE strength to improve his ability to complete his job duties.  Baseline: decreased and painful (02/08/24); 05/02/24: Bilaterally 4+/5  Goal status: MET  4.  Patient will demonstrate L shoulder AROM/PROM pain free and equal or greater to R shoulder to improve his ability to reach overhead and move into awkward positions while performing his job duties and leisure activities such as basketball and going to the gym.  Baseline: limited in overhead movement and painful (02/08/24); 05/02/24: pain with L shoulder IR  Goal status: In progress  5.  Patient will demonstrate improvement in Patient  Specific Functional Scale (PSFS) of equal or greater than 8/10 points to reflect clinically significant improvement in patient's most valued functional activities. Baseline: to be measured at visit 2 as appropriate (02/08/24); 1/10 (02/10/24); did not capture (03/29/24); 05/02/24: 5/10  Goal status: In progress  6.  Patient will report NPRS equal or less than 2/10 during functional activities during the last 2 weeks to improve their abilitly to complete community, work and/or recreational activities with less limitation. Baseline: 8/10 (02/08/24); 5/10 (03/20/2024); 05/02/24: 6/10  Goal status: In progress   PLAN:  PT FREQUENCY: 1-2x/week  PT DURATION: 12 weeks  PLANNED INTERVENTIONS: 97164- PT Re-evaluation, 97750- Physical Performance Testing, 97110-Therapeutic exercises, 97530- Therapeutic activity, [id number]-  Neuromuscular re-education, 97535- Self Care, [id number]- Manual therapy, [id number]- Electrical stimulation (unattended), [phone]- Traction (mechanical), 20560 (1-2 muscles), 20561 (3+ muscles)- Dry Needling, Patient/Family education, Joint mobilization, Spinal mobilization, Cryotherapy, and Moist heat  PLAN FOR NEXT SESSION: update HEP as appropriate, activity modifications to accommodate mechanical stressors and allow healing, then motor control exercises to teach improved mechanics, progressive loading of the shoulder girdle, and reconditioning of the movements and tasks that previously irritated condition. Manual therapy as needed to restore motion and provide sympotm modulation.   Camie SAUNDERS. Juli, PT, DPT, Cert. MDT 05/11/24, 5:38 PM  [facility] [address] [place], [place] [address] P: [phone] I F: [phone]

## 2024-05-16 ENCOUNTER — Ambulatory Visit: Admitting: Physical Therapy

## 2024-05-16 ENCOUNTER — Encounter: Payer: Self-pay | Admitting: Physical Therapy

## 2024-05-16 DIAGNOSIS — M6281 Muscle weakness (generalized): Secondary | ICD-10-CM | POA: Diagnosis not present

## 2024-05-16 DIAGNOSIS — M25512 Pain in left shoulder: Secondary | ICD-10-CM

## 2024-05-16 DIAGNOSIS — M79641 Pain in right hand: Secondary | ICD-10-CM | POA: Diagnosis not present

## 2024-05-16 DIAGNOSIS — M792 Neuralgia and neuritis, unspecified: Secondary | ICD-10-CM

## 2024-05-16 DIAGNOSIS — M79642 Pain in left hand: Secondary | ICD-10-CM | POA: Diagnosis not present

## 2024-05-16 NOTE — Therapy (Signed)
 OUTPATIENT PHYSICAL THERAPY TREATMENT   Patient Name: Thomas Mckay MRN: [id number] DOB:[birth year], 38 y.o., male Today's Date: 05/16/2024  END OF SESSION:  PT End of Session - 05/16/24 1313     Visit Number 19    Number of Visits 28    Date for Recertification  06/13/24    Authorization Type [employer]  reporting period from 03/29/2024    Authorization Time Period 01/18/24-02/15/25  VL:max 30 PT vsts per plan year    Authorization - Number of Visits 30    Progress Note Due on Visit 10    PT Start Time 1309    PT Stop Time 1347    PT Time Calculation (min) 38 min    Activity Tolerance Patient tolerated treatment well    Behavior During Therapy WFL for tasks assessed/performed            Past Medical History:  Diagnosis Date   Arthritis    wrists   History of chlamydia    History of herpes genitalis    Non Hodgkin's lymphoma (HCC)    in remission. completed treatments 08/2012   Seizures (HCC)    x1. as infant   Past Surgical History:  Procedure Laterality Date   LYMPH NODE BIOPSY     SHOULDER ARTHROSCOPY WITH LABRAL REPAIR Right 06/14/2017   Procedure: SHOULDER ARTHROSCOPY WITH LABRAL REPAIR and subacromial debridement.;  Surgeon: Marchia Drivers, MD;  Location: [facility];  Service: Orthopedics;  Laterality: Right;   WISDOM TOOTH EXTRACTION     Patient Active Problem List   Diagnosis Date Noted   Knee pain, bilateral 04/28/2022   Anxiety due to invasive procedure 02/19/2022   Degeneration of lumbar intervertebral disc 02/19/2022   Stiffness of right knee 07/03/2021   Cervical spine pain 04/02/2021   Cervical radiculopathy 04/02/2021   Cervical spondylosis 04/02/2021   Strain of neck muscle 04/02/2021   Right knee pain 12/20/2020   Research study patient 12/06/2019   Abdominal pain 02/22/2019   Proteinuria 02/22/2019   Asymptomatic microscopic hematuria 02/22/2019   Pain in joint involving ankle and foot 02/01/2019   Achilles bursitis 02/01/2019    Achilles tendinitis 02/01/2019   Hip pain 02/01/2019   Lumbar sprain 02/01/2019   Sprain of deltoid ligament of ankle 02/01/2019   Sprain of hand 02/01/2019   Pre-bariatric surgery nutrition evaluation 06/03/2017   Gastroesophageal reflux disease 05/24/2017   Type 2 HSV infection of penis 05/24/2017   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   Shoulder joint pain 05/14/2017   Non-Hodgkin lymphoma (HCC) 04/14/2017   Bilateral carpal tunnel syndrome 01/28/2017   Tear of right glenoid labrum 10/09/2014   Diffuse large B-cell lymphoma of lymph nodes of neck (HCC) 08/02/2014   History of antineoplastic chemotherapy 11/22/2012   History of radiation therapy 11/22/2012   Allergic rhinitis 08/18/2007    PCP: Claudene Tanda POUR, PA-C  REFERRING PROVIDER: Kathlynn Sharper, MD  REFERRING DIAG: Left rotator cuff tendinitis  THERAPY DIAG:  Acute pain of left shoulder  Muscle weakness (generalized)  Neuralgia and neuritis  Rationale for Evaluation and Treatment: Rehabilitation  ONSET DATE: June 2025  SUBJECTIVE:  PERTINENT HISTORY: Patient is a 38 y.o. male who presents to outpatient physical therapy with a referral for medical diagnosis left rotator cuff tendinitis. This patient's chief complaints consist of left shoulder pain and arm weakness with intermittent paresthesia to the hand, leading to the following functional deficits: difficulty with all activities that require use of L UE including reaching, carrying, lifting, restraining and holding animals, working, sleeping, housework, peparing food, grooming, pushing, driving, yardwork, dressing, using tools or appliances, laundry, throwing a ball, playing basket ball, going to the gym. Relevant past medical history and comorbidities include the following: he has Pain in  joint involving ankle and foot; Achilles bursitis; Achilles tendinitis; Allergic rhinitis; Bilateral carpal tunnel syndrome; Boutonniere deformity; Diffuse large B-cell lymphoma of lymph nodes of neck (HCC); Gastroesophageal reflux disease; Hip pain; History of antineoplastic chemotherapy; Lumbar sprain; Non-Hodgkin lymphoma (HCC); Sprain of deltoid ligament of ankle; Sprain of hand; Tear of right glenoid labrum; Type 2 HSV infection of penis; Shoulder joint pain; History of radiation therapy; Abdominal pain; Proteinuria; Asymptomatic microscopic hematuria; Pre-bariatric surgery nutrition evaluation; Research study patient; Right knee pain; Cervical spine pain; Cervical radiculopathy; Cervical spondylosis; Strain of neck muscle; Stiffness of right knee; Knee pain, bilateral;  and Degeneration of lumbar intervertebral disc on their problem list. he  has a past medical history of Arthritis, History of chlamydia, History of herpes genitalis, Non Hodgkin's lymphoma (HCC), and Seizures (as a baby). he  has a past surgical history that includes Wisdom tooth extraction; Lymph node biopsy; and Shoulder arthroscopy with labral repair (Right, 06/14/2017). Patient denies hx of stroke, lung problems, heart problems, diabetes, unexplained weight loss, unexplained changes in bowel or bladder problems, unexplained stumbling or dropping things, osteoporosis, and spinal surgery, or left shoulder surgery.   Exercise history: likes to go to the gym and play basketball. Might have a 5#DB. Also has curling bench press thing. He still has a photographer.   Right handed  SUBJECTIVE STATEMENT: Patient states his shoulder is doing a lot better. He states it feels stronger and he can carry heavier things without it bothering him as much.   Top three problems: carrying something with a weight for a couple of minutes especially if elbow flexed, repeatedly opening or closing a heavy door.  PAIN: NPRS: Current: 1/10 L anterior  shoulder  Best: 2-3/10, Worst: 8/10. Pain location: inside left shoulder, worst at anterior shoulder near pec, numbness over his whole left hand (currently there because he was poking the front of his shoulder).  Pain description: dull, Aggravating factors: the more he uses his left shoulder for anything weight wise including holding up kittens that are 2-3 lbs, reaching, sleeping on his left side (does not wake him), housework Relieving factors: as soon as he wakes up, hot bath   PRECAUTIONS: Other: don't lift anything over 10 lbs with left UE   PATIENT GOALS: back to normal hoping it ain't going to tear  NEXT MD VISIT: 5 weeks from last visit with Dr. Kathlynn   OBJECTIVE  TREATMENT:  Neuromuscular Re-education: a technique or exercise performed with the goal of improving the level of communication between the body and the brain, such as for balance, motor control, muscle activation patterns, coordination, desensitization, quality of muscle contraction, proprioception, and/or kinesthetic sense needed for successful and safe completion of functional activities.   Standing body blade bounces in ER/IR with elbow flexed to 90 degrees and shoulder in neutral and towel roll under arm to improve shoulder stablity 3x30 seconds each side with long body blade  Therapeutic activities: dynamic therapeutic activities incorporating MULTIPLE parameters or areas of the body designed to achieve improved functional performance.  Seated lat pull 1x15 with 65#  Circuit Quadruped single arm retraction to horizontal abduction at bench 3x15 each side with 5#DB  Prone on bench, B shoulder ER with chin supported on bench 3x10 with 5 second hold using 2#DB (incomplete ROM)  Seated slightly reclined on weight bench with head resting on bench to offload neck (back positioned 2nd from  top)  Biceps curls to improve lifting 3x15 with 17#DB in each hand Head resting on bench  Isometric shoulder ER against band around wrists at 3 positions to improve tolerance for overhead activities Neutral shoulder with 90 degrees elbow flexion 90 degrees shoulder flexion with 90 degrees elbow flexion Overhead reach 3x5 with 5 second holds each position with GTB (attempted one rep with Blue GTB but too difficult to perform well)  Pt required multimodal cuing for proper technique and to facilitate improved neuromuscular control, strength, range of motion, and functional ability resulting in improved performance and form.  PATIENT EDUCATION: Education details: Exercise purpose/form. Self management techniques. Education on diagnosis, prognosis, POC, anatomy and physiology of current condition. Education on imaging.  Person educated: Patient Education method: Explanation Education comprehension: verbalized understanding and needs further education  HOME EXERCISE PROGRAM: Access Code: [email or web address] URL: [email or web address] Date: 03/07/2024 Prepared by: Camie Cleverly  Program Notes [email or web address]   Exercises - Lat Pull with band/cable  - 1 x daily - 2 sets - 10 reps - 5 seconds hold - Prone Shoulder External Rotation with Towel Roll  - 1 x daily - 3 sets - 15 reps  HOME EXERCISE PROGRAM [[email or web address]]  Prone B shoulder ER for lower traps -  Repeat 20 Repetitions, Hold 5 Seconds, Complete 2 Sets, Perform 1 Times a Day  ASSESSMENT:  CLINICAL IMPRESSION:    Continued with UE and shoulder girdle strengthening to improve functional abilities. Patient able to progress to heavier bicep curls but was not ready to increased load over other exercises. Patient tolerated treatment well and reported feeling better by end of session. Patient would benefit from continued management of limiting condition by skilled physical therapist to address  remaining impairments and functional limitations to work towards stated goals and return to PLOF or maximal functional independence.   From initial PT evaluation  on 02/08/24:  Patient is a 38 y.o. male referred to outpatient physical therapy with a medical diagnosis of left rotator cuff tendinitis who presents with signs and symptoms consistent with left shoulder pain and L UE weakness and paresthesia following an injury in June. He appears to have myotomal weakness at C5 and C6 that improves with cervical unloading thorough distraction, suggesting a cervical spine contribution to his condition. This would match with the has left sided upper trap discomfort he experiences with left cervical spine rotation, sidebending, and extension and the intermittent paresthesia to his hand. However, his DTRs appear equal and intact bilaterally. C5 myotomes tested today  also correspond to the same motions  most commonly affected by a RTC condition (shoulder ER and abduction). Plan to address RTC and shoulder as well as any cervical spine contributions. Patient has good motion B shoulders except some limited overhead movement in the L. Patient presents with significant pain, ROM, joint stiffness, muscle performance (strength/power/endurance), paresthesia, and activity tolerance impairments that are limiting ability to complete all activities that require use of L UE including reaching, carrying, lifting, restraining and holding animals, working, sleeping, housework, peparing food, grooming, pushing, driving, yardwork, dressing, using tools or appliances, laundry, throwing a ball, playing basket ball, going to the gym without difficulty. Patient will benefit from skilled physical therapy intervention to address current body structure impairments and activity limitations to improve function and work towards goals set in current POC in order to return to prior level of function or maximal functional improvement.    OBJECTIVE  IMPAIRMENTS: decreased activity tolerance, decreased endurance, decreased knowledge of condition, decreased ROM, decreased strength, impaired UE functional use, improper body mechanics, postural dysfunction, and pain.   ACTIVITY LIMITATIONS: carrying, lifting, sleeping, bathing, dressing, reach over head, hygiene/grooming, and caring for others  PARTICIPATION LIMITATIONS: meal prep, cleaning, laundry, interpersonal relationship, driving, shopping, community activity, occupation, and yard work  PERSONAL FACTORS: Profession and 3+ comorbidities:   Allergic rhinitis; Bilateral carpal tunnel syndrome; Boutonniere deformity; Diffuse large B-cell lymphoma of lymph nodes of neck (HCC); Gastroesophageal reflux disease; Hip pain; History of antineoplastic chemotherapy; Lumbar sprain; Non-Hodgkin lymphoma (HCC);  Tear of right glenoid labrum; Shoulder joint pain; History of radiation therapy;  Cervical spine pain; Cervical radiculopathy; Cervical spondylosis;  and Degeneration of lumbar intervertebral disc on their problem list. he  has a past medical history of Arthritis,  Non Hodgkin's lymphoma (HCC), and Seizures (as a baby). he  has a past surgical history that includes Wisdom tooth extraction; Lymph node biopsy; and Shoulder arthroscopy with labral repair (Right, 06/14/2017) are also affecting patient's functional outcome.   REHAB POTENTIAL: Good  CLINICAL DECISION MAKING: Evolving/moderate complexity  EVALUATION COMPLEXITY: Moderate   GOALS: Goals reviewed with patient? No  SHORT TERM GOALS: Target date: 02/22/2024  Patient will be independent with initial home exercise program for self-management of symptoms. Baseline: Initial HEP to be provided at visit 2 as appropriate (02/08/24); Goal status: MET   LONG TERM GOALS: Target date: 05/02/2024  Patient will be independent with a long-term home exercise program for self-management of symptoms.  Baseline: Initial HEP to be provided at visit 2 as  appropriate (02/08/24); completes HEP daily (03/29/2024);  Goal status: MET  2.  Patient will demonstrate improved Upper Extremity Functional Scale (UEFS) to equal or greater than 64/80 to demonstrate improvement in overall condition and self-reported functional ability.  Baseline: 34/80 (02/08/24); 05/02/24: 50/80 Goal status: In progress  3.  Patient will demonstrate pain free L UE strength equal or greater to R UE strength to improve his ability to complete his job duties.  Baseline: decreased and painful (02/08/24); 05/02/24: Bilaterally 4+/5  Goal status: MET  4.  Patient will demonstrate L shoulder AROM/PROM pain free and equal or greater to R shoulder to improve his ability to reach overhead and move into awkward positions while performing his job duties and leisure activities such as basketball and going to the gym.  Baseline: limited in overhead movement and painful (02/08/24); 05/02/24: pain with L shoulder IR  Goal status: In progress  5.  Patient will demonstrate improvement in Patient Specific Functional Scale (PSFS) of equal or  greater than 8/10 points to reflect clinically significant improvement in patient's most valued functional activities. Baseline: to be measured at visit 2 as appropriate (02/08/24); 1/10 (02/10/24); did not capture (03/29/24); 05/02/24: 5/10  Goal status: In progress  6.  Patient will report NPRS equal or less than 2/10 during functional activities during the last 2 weeks to improve their abilitly to complete community, work and/or recreational activities with less limitation. Baseline: 8/10 (02/08/24); 5/10 (03/20/2024); 05/02/24: 6/10  Goal status: In progress   PLAN:  PT FREQUENCY: 1-2x/week  PT DURATION: 12 weeks  PLANNED INTERVENTIONS: 97164- PT Re-evaluation, 97750- Physical Performance Testing, 97110-Therapeutic exercises, 97530- Therapeutic activity, [id number]- Neuromuscular re-education, 97535- Self Care, 02859- Manual therapy, G0283- Electrical  stimulation (unattended), [id number]- Traction (mechanical), 20560 (1-2 muscles), 20561 (3+ muscles)- Dry Needling, Patient/Family education, Joint mobilization, Spinal mobilization, Cryotherapy, and Moist heat  PLAN FOR NEXT SESSION: progress note. update HEP as appropriate, activity modifications to accommodate mechanical stressors and allow healing, then motor control exercises to teach improved mechanics, progressive loading of the shoulder girdle, and reconditioning of the movements and tasks that previously irritated condition. Manual therapy as needed to restore motion and provide sympotm modulation.   Camie SAUNDERS. Juli, PT, DPT, Cert. MDT 05/16/24, 1:47 PM  [facility] [address] [place], [place] [address] P: [phone] I F: [phone]

## 2024-05-18 ENCOUNTER — Ambulatory Visit: Admitting: Physical Therapy

## 2024-06-05 ENCOUNTER — Encounter: Payer: Self-pay | Admitting: Physical Therapy

## 2024-06-05 ENCOUNTER — Ambulatory Visit: Attending: Orthopedic Surgery | Admitting: Physical Therapy

## 2024-06-05 ENCOUNTER — Telehealth: Payer: Self-pay | Admitting: Physical Therapy

## 2024-06-05 DIAGNOSIS — M25512 Pain in left shoulder: Secondary | ICD-10-CM | POA: Diagnosis not present

## 2024-06-05 DIAGNOSIS — M6281 Muscle weakness (generalized): Secondary | ICD-10-CM | POA: Insufficient documentation

## 2024-06-05 DIAGNOSIS — M79641 Pain in right hand: Secondary | ICD-10-CM | POA: Diagnosis not present

## 2024-06-05 DIAGNOSIS — M792 Neuralgia and neuritis, unspecified: Secondary | ICD-10-CM | POA: Insufficient documentation

## 2024-06-05 DIAGNOSIS — M79642 Pain in left hand: Secondary | ICD-10-CM | POA: Diagnosis not present

## 2024-06-05 NOTE — Telephone Encounter (Signed)
 Spoke with patient notifying of missed PT visit scheduled at 9am today. Patient stated he forgot about appointment and was at work. Patient was able to reschedule to 4:45pm.   Vernell Rise, SPT  [facility] [address] [place], [place] [address] P: [phone] I F: [phone]

## 2024-06-05 NOTE — Therapy (Unsigned)
 OUTPATIENT PHYSICAL THERAPY TREATMENT/PROGRESS NOTE Dates of Reporting From 03/29/24 to 06/05/24   Patient Name: Thomas Mckay MRN: [id number] DOB:1986/01/05, 38 y.o., male Today's Date: 06/05/2024  END OF SESSION:  PT End of Session - 06/05/24 1651     Visit Number 20    Number of Visits 28    Date for Recertification  06/13/24    Authorization Type [employer]  reporting period from 03/29/2024    Authorization Time Period 01/18/24-02/15/25  VL:max 30 PT vsts per plan year    Authorization - Number of Visits 30    Progress Note Due on Visit 20    PT Start Time 1652    PT Stop Time 1732    PT Time Calculation (min) 40 min    Activity Tolerance Patient tolerated treatment well    Behavior During Therapy WFL for tasks assessed/performed         Past Medical History:  Diagnosis Date   Arthritis    wrists   History of chlamydia    History of herpes genitalis    Non Hodgkin's lymphoma (HCC)    in remission. completed treatments 08/2012   Seizures (HCC)    x1. as infant   Past Surgical History:  Procedure Laterality Date   LYMPH NODE BIOPSY     SHOULDER ARTHROSCOPY WITH LABRAL REPAIR Right 06/14/2017   Procedure: SHOULDER ARTHROSCOPY WITH LABRAL REPAIR and subacromial debridement.;  Surgeon: Marchia Drivers, MD;  Location: [facility];  Service: Orthopedics;  Laterality: Right;   WISDOM TOOTH EXTRACTION     Patient Active Problem List   Diagnosis Date Noted   Knee pain, bilateral 04/28/2022   Anxiety due to invasive procedure 02/19/2022   Degeneration of lumbar intervertebral disc 02/19/2022   Stiffness of right knee 07/03/2021   Cervical spine pain 04/02/2021   Cervical radiculopathy 04/02/2021   Cervical spondylosis 04/02/2021   Strain of neck muscle 04/02/2021   Right knee pain 12/20/2020   Research study patient 12/06/2019   Abdominal pain 02/22/2019   Proteinuria 02/22/2019   Asymptomatic microscopic hematuria 02/22/2019   Pain in joint involving ankle and  foot 02/01/2019   Achilles bursitis 02/01/2019   Achilles tendinitis 02/01/2019   Hip pain 02/01/2019   Lumbar sprain 02/01/2019   Sprain of deltoid ligament of ankle 02/01/2019   Sprain of hand 02/01/2019   Pre-bariatric surgery nutrition evaluation 06/03/2017   Gastroesophageal reflux disease 05/24/2017   Type 2 HSV infection of penis 05/24/2017   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   Shoulder joint pain 05/14/2017   Non-Hodgkin lymphoma (HCC) 04/14/2017   Bilateral carpal tunnel syndrome 01/28/2017   Tear of right glenoid labrum 10/09/2014   Diffuse large B-cell lymphoma of lymph nodes of neck (HCC) 08/02/2014   History of antineoplastic chemotherapy 11/22/2012   History of radiation therapy 11/22/2012   Allergic rhinitis 08/18/2007   PCP: Claudene Tanda POUR, PA-C  REFERRING PROVIDER: Kathlynn Sharper, MD  REFERRING DIAG: Left rotator cuff tendinitis  THERAPY DIAG:  Bilateral hand pain  Acute pain of left shoulder  Muscle weakness (generalized)  Neuralgia and neuritis  Rationale for Evaluation and Treatment: Rehabilitation  ONSET DATE: June 2025  SUBJECTIVE:  PERTINENT HISTORY: Patient is a 38 y.o. male who presents to outpatient physical therapy with a referral for medical diagnosis left rotator cuff tendinitis. This patient's chief complaints consist of left shoulder pain and arm weakness with intermittent paresthesia to the hand, leading to the following functional deficits: difficulty with all activities that require use of L UE including reaching, carrying, lifting, restraining and holding animals, working, sleeping, housework, peparing food, grooming, pushing, driving, yardwork, dressing, using tools or appliances, laundry, throwing a ball, playing basket ball, going to the gym. Relevant past  medical history and comorbidities include the following: he has Pain in joint involving ankle and foot; Achilles bursitis; Achilles tendinitis; Allergic rhinitis; Bilateral carpal tunnel syndrome; Boutonniere deformity; Diffuse large B-cell lymphoma of lymph nodes of neck (HCC); Gastroesophageal reflux disease; Hip pain; History of antineoplastic chemotherapy; Lumbar sprain; Non-Hodgkin lymphoma (HCC); Sprain of deltoid ligament of ankle; Sprain of hand; Tear of right glenoid labrum; Type 2 HSV infection of penis; Shoulder joint pain; History of radiation therapy; Abdominal pain; Proteinuria; Asymptomatic microscopic hematuria; Pre-bariatric surgery nutrition evaluation; Research study patient; Right knee pain; Cervical spine pain; Cervical radiculopathy; Cervical spondylosis; Strain of neck muscle; Stiffness of right knee; Knee pain, bilateral;  and Degeneration of lumbar intervertebral disc on their problem list. he  has a past medical history of Arthritis, History of chlamydia, History of herpes genitalis, Non Hodgkin's lymphoma (HCC), and Seizures (as a baby). he  has a past surgical history that includes Wisdom tooth extraction; Lymph node biopsy; and Shoulder arthroscopy with labral repair (Right, 06/14/2017). Patient denies hx of stroke, lung problems, heart problems, diabetes, unexplained weight loss, unexplained changes in bowel or bladder problems, unexplained stumbling or dropping things, osteoporosis, and spinal surgery, or left shoulder surgery.   Exercise history: likes to go to the gym and play basketball. Might have a 5#DB. Also has curling bench press thing. He still has a photographer.   Right handed  SUBJECTIVE STATEMENT: Patient states he has been doing HEP and has been having some difficulty with overhead tasks at work. Stated he has been avoiding those tasks or asks for help to complete. PAIN: NPRS: Current: 1/10 in L shoulder  Best: 2-3/10, Worst: 8/10. Pain location: inside left  shoulder, worst at anterior shoulder near pec, numbness over his whole left hand (currently there because he was poking the front of his shoulder).  Pain description: dull, Aggravating factors: the more he uses his left shoulder for anything weight wise including holding up kittens that are 2-3 lbs, reaching, sleeping on his left side (does not wake him), housework Relieving factors: as soon as he wakes up, hot bath  PRECAUTIONS: Other: don't lift anything over 10 lbs with left UE  PATIENT GOALS: back to normal hoping it ain't going to tear  NEXT MD VISIT: 5 weeks from last visit with Dr. Kathlynn   OBJECTIVE  SELF-REPORTED FUNCTION  Patient Specific Functional Scale (PSFS)  Basketball: 6/10 Picking up youngest son: 6/10 Being able to use L arm without any difficulties: 5/10 Average: 5.6/10  Upper Extremity Functional Scale (UEFS)   Extreme difficulty/unable (0), Quite a bit of difficulty (1), Moderate difficulty (2), Little difficulty (3), No difficulty (4) Survey date:  06/05/2024  Any of your usual work, household or school activities 3  2. Your usual hobbies, recreational/sport activities 2   3. Lifting a bag of groceries to waist level 3   4. Lifting a bag of groceries above your head 2  5. Grooming your hair 3  6. Pushing up on your hands (I.e. from bathtub or chair) 3  7. Preparing food (I.e. peeling/cutting) 4  8. Driving  4  9. Vacuuming, sweeping, or raking 3  10. Dressing  4  11. Doing up buttons 3  12. Using tools/appliances 3  13. Opening doors 3  14. Cleaning  3  15. Tying or lacing shoes 3  16. Sleeping  3  17. Laundering clothes (I.e. washing, ironing, folding) 3  18. Opening a jar 2  19. Throwing a ball 2  20. Carrying a small suitcase with your affected limb.  2  Score total:  58/80   PERIPHERAL JOINT AROM Shoulder      Flexion R: 174 L: 170 (pain and tightness above shoulder height) Abduction R:173 L:152 (pain and tightness above shoulder  height) Extension R: 62 L: 58 (tightness with end range extension) Functional Internal rotation R:T7 L:T7 (pain upon return from IR)   MUSCLE PERFORMANCE/MYOTOMES (MMTx3 each): *Indicates pain 02/08/24 03/29/24 06/05/24  Joint/Motion R/L R/L R/L  Shoulder        Flexion / 5/4+ 5/5  Abduction (C5) 5/4* 5/4+* 5/5  External rotation (C5) 5/4* 5/5* 5/5  Elbow       Flexion (C6) 5/4* 5/4* 5/5  Extension (C7) 4/4+* 4+*/4+* 5/5  Wrist       Extension (C6) 4/4 5/5 5/5  Hand       MCP extension (C8) 4/4 4/4 /  Pinky finger abduction (T1) 4/4 4/4 /  Finger adduction (C8) 4/4 5/5 /  Comments:  02/08/24: improved strength where tested (Shoulder ER) when placed supine and with cervical spine distraction.  06/05/24: patient noted a soreness in B shoulder when completing strength testing   Grip strength (in pounds, average of three measures, last tested 06/05/24).  R: (100+109+107)/3 =105.3 L: (90+80+85)/3 = 85   NEUROLOGICAL Traction alleviation: Positive in sitting  Pt reported a decrease in L shoulder pain accompanied by cervical traction   DTR (last tested 06/05/24); Biceps:  R 3+,  L 2+ Triceps: B 2+ Wrist extensors: R 3+, L 2+ Pronator: R 0+, L 2+ Finger flexors:  B 2+                                                                                                               TREATMENT:  Therapeutic exercise: therapeutic exercises that incorporate ONE parameter at one or more areas of the body to centralize symptoms, develop strength and endurance, range of motion, and flexibility required for successful completion of functional activities.  ROM, muscle performance, grip strength, and reflex testing to assess progress (see above)  Manual therapy: to reduce pain and tissue tension, improve range of motion, neuromodulation, in order to promote improved ability to complete functional activities.  Cervical spine traction alleviation test to test for nerve root component to  condition.    PATIENT EDUCATION: Education details: Exercise purpose/form. Self management techniques. Education on diagnosis, prognosis, POC, anatomy and physiology of current condition. Education on imaging.  Person educated: Patient Education method: Explanation  Education comprehension: verbalized understanding and needs further education  HOME EXERCISE PROGRAM: Access Code: [email or web address] URL: [email or web address] Date: 03/07/2024 Prepared by: Camie Cleverly  Program Notes [email or web address]   Exercises - Lat Pull with band/cable  - 1 x daily - 2 sets - 10 reps - 5 seconds hold - Prone Shoulder External Rotation with Towel Roll  - 1 x daily - 3 sets - 15 reps  HOME EXERCISE PROGRAM [[email or web address]]  Prone B shoulder ER for lower traps -  Repeat 20 Repetitions, Hold 5 Seconds, Complete 2 Sets, Perform 1 Times a Day  ASSESSMENT:  CLINICAL IMPRESSION:    Patient has attended 20 PT sessions since starting his episode of care on 02/08/2024. He reports active participation in his HEP but has yet to receive his final long term HEP. He has improved L UE strength, but still continues to have pain and tightness with all muscle testing. Overall UE AROM has improved, however patient reports pain and tightness with overhead reaching as well as abduction and flexion above shoulder height. Upon return from functional IR patient reported a pulling sensation in the front of his L shoulder.  Patient is still having pain with functional activities, but it has decreased slightly and reports he is still not at prior level of function.  Patient would benefit from continued management of limiting condition by skilled physical therapist to address remaining impairments and functional limitations to work towards stated goals and return to PLOF or maximal functional independence.   From initial PT evaluation  on 02/08/24:  Patient is a 38 y.o. male referred to  outpatient physical therapy with a medical diagnosis of left rotator cuff tendinitis who presents with signs and symptoms consistent with left shoulder pain and L UE weakness and paresthesia following an injury in June. He appears to have myotomal weakness at C5 and C6 that improves with cervical unloading thorough distraction, suggesting a cervical spine contribution to his condition. This would match with the has left sided upper trap discomfort he experiences with left cervical spine rotation, sidebending, and extension and the intermittent paresthesia to his hand. However, his DTRs appear equal and intact bilaterally. C5 myotomes tested today also correspond to the same motions  most commonly affected by a RTC condition (shoulder ER and abduction). Plan to address RTC and shoulder as well as any cervical spine contributions. Patient has good motion B shoulders except some limited overhead movement in the L. Patient presents with significant pain, ROM, joint stiffness, muscle performance (strength/power/endurance), paresthesia, and activity tolerance impairments that are limiting ability to complete all activities that require use of L UE including reaching, carrying, lifting, restraining and holding animals, working, sleeping, housework, peparing food, grooming, pushing, driving, yardwork, dressing, using tools or appliances, laundry, throwing a ball, playing basket ball, going to the gym without difficulty. Patient will benefit from skilled physical therapy intervention to address current body structure impairments and activity limitations to improve function and work towards goals set in current POC in order to return to prior level of function or maximal functional improvement.    OBJECTIVE IMPAIRMENTS: decreased activity tolerance, decreased endurance, decreased knowledge of condition, decreased ROM, decreased strength, impaired UE functional use, improper body mechanics, postural dysfunction, and pain.    ACTIVITY LIMITATIONS: carrying, lifting, sleeping, bathing, dressing, reach over head, hygiene/grooming, and caring for others  PARTICIPATION LIMITATIONS: meal prep, cleaning, laundry, interpersonal relationship, driving, shopping, community activity, occupation, and yard work  PERSONAL FACTORS: Profession and 3+ comorbidities:   Allergic rhinitis; Bilateral carpal  tunnel syndrome; Boutonniere deformity; Diffuse large B-cell lymphoma of lymph nodes of neck (HCC); Gastroesophageal reflux disease; Hip pain; History of antineoplastic chemotherapy; Lumbar sprain; Non-Hodgkin lymphoma (HCC);  Tear of right glenoid labrum; Shoulder joint pain; History of radiation therapy;  Cervical spine pain; Cervical radiculopathy; Cervical spondylosis;  and Degeneration of lumbar intervertebral disc on their problem list. he  has a past medical history of Arthritis,  Non Hodgkin's lymphoma (HCC), and Seizures (as a baby). he  has a past surgical history that includes Wisdom tooth extraction; Lymph node biopsy; and Shoulder arthroscopy with labral repair (Right, 06/14/2017) are also affecting patient's functional outcome.   REHAB POTENTIAL: Good  CLINICAL DECISION MAKING: Evolving/moderate complexity  EVALUATION COMPLEXITY: Moderate   GOALS: Goals reviewed with patient? No  SHORT TERM GOALS: Target date: 02/22/2024  Patient will be independent with initial home exercise program for self-management of symptoms. Baseline: Initial HEP to be provided at visit 2 as appropriate (02/08/24); Goal status: MET  LONG TERM GOALS: Target date: 05/02/2024  Patient will be independent with a long-term home exercise program for self-management of symptoms.  Baseline: Initial HEP to be provided at visit 2 as appropriate (02/08/24); completes HEP daily (03/29/2024); reports participation (06/05/2024); Goal status: in progress  2.  Patient will demonstrate improved Upper Extremity Functional Scale (UEFS) to equal or greater  than 64/80 to demonstrate improvement in overall condition and self-reported functional ability.  Baseline: 34/80 (02/08/24); 05/02/24: 50/80; 06/05/24: 58/80  Goal status: In progress  3.  Patient will demonstrate pain free L UE strength equal or greater to R UE strength to improve his ability to complete his job duties.  Baseline: decreased and painful (02/08/24); 05/02/24: Bilaterally 4+/5; 06/05/24: 5/5 bilaterally Goal status: MET  4.  Patient will demonstrate L shoulder AROM/PROM pain free and equal or greater to R shoulder to improve his ability to reach overhead and move into awkward positions while performing his job duties and leisure activities such as basketball and going to the gym.  Baseline: limited in overhead movement and painful (02/08/24); 05/02/24: pain with L shoulder IR; 06/05/24: pain and tightness in L shoulder with ROM above shoulder height Goal status: MET  5.  Patient will demonstrate improvement in Patient Specific Functional Scale (PSFS) of equal or greater than 8/10 points to reflect clinically significant improvement in patient's most valued functional activities. Baseline: to be measured at visit 2 as appropriate (02/08/24); 1/10 (02/10/24); did not capture (03/29/24); 05/02/24: 5/10; 5.5/10 (06/05/24) Goal status: In progress  6.  Patient will report NPRS equal or less than 2/10 during functional activities during the last 2 weeks to improve their abilitly to complete community, work and/or recreational activities with less limitation. Baseline: 8/10 (02/08/24); 5/10 (03/20/2024); 05/02/24: 6/10; 06/05/24: 5/10 Goal status: In progress  PLAN:  PT FREQUENCY: 1-2x/week  PT DURATION: 12 weeks  PLANNED INTERVENTIONS: 97164- PT Re-evaluation, 97750- Physical Performance Testing, 97110-Therapeutic exercises, 97530- Therapeutic activity, [id number]- Neuromuscular re-education, 97535- Self Care, [id number]- Manual therapy, [id number]- Electrical stimulation (unattended), [phone]-  Traction (mechanical), 20560 (1-2 muscles), 20561 (3+ muscles)- Dry Needling, Patient/Family education, Joint mobilization, Spinal mobilization, Cryotherapy, and Moist heat  PLAN FOR NEXT SESSION: Plan to continue PT to continue working on shoulder stability and overhead reaching, as well as finalize a long term HEP. Consider adding functional shoulder stabilization exercises such as resisted shoulder flexion wall slides, resisted shoulder 3 way reaches, resisted shoulder ER/IR as well as cervical retractions and upper trap strethces.   Vernell Rise, SPT  Camie SAUNDERS.  Juli, PT, DPT, Cert. MDT 06/06/24, 2:13 PM  [facility] [address] [place], [place] [address] P: [phone] I F: [phone]

## 2024-06-07 NOTE — Therapy (Unsigned)
 OUTPATIENT PHYSICAL THERAPY TREATMENT/PROGRESS NOTE Dates of Reporting From 03/29/24 to 06/05/24   Patient Name: Thomas Mckay MRN: [id number] DOB:[birth year], 38 y.o., male Today's Date: 06/07/2024  END OF SESSION:   Past Medical History:  Diagnosis Date   Arthritis    wrists   History of chlamydia    History of herpes genitalis    Non Hodgkin's lymphoma (HCC)    in remission. completed treatments 08/2012   Seizures (HCC)    x1. as infant   Past Surgical History:  Procedure Laterality Date   LYMPH NODE BIOPSY     SHOULDER ARTHROSCOPY WITH LABRAL REPAIR Right 06/14/2017   Procedure: SHOULDER ARTHROSCOPY WITH LABRAL REPAIR and subacromial debridement.;  Surgeon: Marchia Drivers, MD;  Location: [facility];  Service: Orthopedics;  Laterality: Right;   WISDOM TOOTH EXTRACTION     Patient Active Problem List   Diagnosis Date Noted   Knee pain, bilateral 04/28/2022   Anxiety due to invasive procedure 02/19/2022   Degeneration of lumbar intervertebral disc 02/19/2022   Stiffness of right knee 07/03/2021   Cervical spine pain 04/02/2021   Cervical radiculopathy 04/02/2021   Cervical spondylosis 04/02/2021   Strain of neck muscle 04/02/2021   Right knee pain 12/20/2020   Research study patient 12/06/2019   Abdominal pain 02/22/2019   Proteinuria 02/22/2019   Asymptomatic microscopic hematuria 02/22/2019   Pain in joint involving ankle and foot 02/01/2019   Achilles bursitis 02/01/2019   Achilles tendinitis 02/01/2019   Hip pain 02/01/2019   Lumbar sprain 02/01/2019   Sprain of deltoid ligament of ankle 02/01/2019   Sprain of hand 02/01/2019   Pre-bariatric surgery nutrition evaluation 06/03/2017   Gastroesophageal reflux disease 05/24/2017   Type 2 HSV infection of penis 05/24/2017   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   Shoulder joint pain 05/14/2017   Non-Hodgkin lymphoma (HCC) 04/14/2017   Bilateral carpal tunnel syndrome 01/28/2017   Tear of right glenoid  labrum 10/09/2014   Diffuse large B-cell lymphoma of lymph nodes of neck (HCC) 08/02/2014   History of antineoplastic chemotherapy 11/22/2012   History of radiation therapy 11/22/2012   Allergic rhinitis 08/18/2007   PCP: Claudene Tanda POUR, PA-C  REFERRING PROVIDER: Kathlynn Sharper, MD  REFERRING DIAG: Left rotator cuff tendinitis  THERAPY DIAG:  Bilateral hand pain  Acute pain of left shoulder  Muscle weakness (generalized)  Neuralgia and neuritis  Rationale for Evaluation and Treatment: Rehabilitation  ONSET DATE: June 2025  SUBJECTIVE:  PERTINENT HISTORY: Patient is a 38 y.o. male who presents to outpatient physical therapy with a referral for medical diagnosis left rotator cuff tendinitis. This patient's chief complaints consist of left shoulder pain and arm weakness with intermittent paresthesia to the hand, leading to the following functional deficits: difficulty with all activities that require use of L UE including reaching, carrying, lifting, restraining and holding animals, working, sleeping, housework, peparing food, grooming, pushing, driving, yardwork, dressing, using tools or appliances, laundry, throwing a ball, playing basket ball, going to the gym. Relevant past medical history and comorbidities include the following: he has Pain in joint involving ankle and foot; Achilles bursitis; Achilles tendinitis; Allergic rhinitis; Bilateral carpal tunnel syndrome; Boutonniere deformity; Diffuse large B-cell lymphoma of lymph nodes of neck (HCC); Gastroesophageal reflux disease; Hip pain; History of antineoplastic chemotherapy; Lumbar sprain; Non-Hodgkin lymphoma (HCC); Sprain of deltoid ligament of ankle; Sprain of hand; Tear of right glenoid labrum; Type 2 HSV infection of penis; Shoulder joint pain;  History of radiation therapy; Abdominal pain; Proteinuria; Asymptomatic microscopic hematuria; Pre-bariatric surgery nutrition evaluation; Research study patient; Right knee pain; Cervical spine pain; Cervical radiculopathy; Cervical spondylosis; Strain of neck muscle; Stiffness of right knee; Knee pain, bilateral;  and Degeneration of lumbar intervertebral disc on their problem list. he  has a past medical history of Arthritis, History of chlamydia, History of herpes genitalis, Non Hodgkin's lymphoma (HCC), and Seizures (as a baby). he  has a past surgical history that includes Wisdom tooth extraction; Lymph node biopsy; and Shoulder arthroscopy with labral repair (Right, 06/14/2017). Patient denies hx of stroke, lung problems, heart problems, diabetes, unexplained weight loss, unexplained changes in bowel or bladder problems, unexplained stumbling or dropping things, osteoporosis, and spinal surgery, or left shoulder surgery.   Exercise history: likes to go to the gym and play basketball. Might have a 5#DB. Also has curling bench press thing. He still has a photographer.   Right handed  SUBJECTIVE STATEMENT: *** PAIN: NPRS: Current: ***  Best: 2-3/10, Worst: 8/10. Pain location: inside left shoulder, worst at anterior shoulder near pec, numbness over his whole left hand (currently there because he was poking the front of his shoulder).  Pain description: dull, Aggravating factors: the more he uses his left shoulder for anything weight wise including holding up kittens that are 2-3 lbs, reaching, sleeping on his left side (does not wake him), housework Relieving factors: as soon as he wakes up, hot bath  PRECAUTIONS: Other: don't lift anything over 10 lbs with left UE  PATIENT GOALS: back to normal hoping it ain't going to tear  NEXT MD VISIT: 5 weeks from last visit with Dr. Kathlynn   OBJECTIVE                                                                                                                TREATMENT:  Therapeutic exercise: therapeutic exercises that incorporate ONE parameter at one or more areas of the body to centralize symptoms, develop strength and endurance, range of motion, and flexibility required  for successful completion of functional activities.  ROM, muscle performance, grip strength, and reflex testing to assess progress (see above)  Manual therapy: to reduce pain and tissue tension, improve range of motion, neuromodulation, in order to promote improved ability to complete functional activities.  Cervical spine traction alleviation test to test for nerve root component to condition.    PATIENT EDUCATION: Education details: Exercise purpose/form. Self management techniques. Education on diagnosis, prognosis, POC, anatomy and physiology of current condition. Education on imaging.  Person educated: Patient Education method: Explanation Education comprehension: verbalized understanding and needs further education  HOME EXERCISE PROGRAM: Access Code: [email or web address] URL: [email or web address] Date: 03/07/2024 Prepared by: Camie Cleverly  Program Notes [email or web address]   Exercises - Lat Pull with band/cable  - 1 x daily - 2 sets - 10 reps - 5 seconds hold - Prone Shoulder External Rotation with Towel Roll  - 1 x daily - 3 sets - 15 reps  HOME EXERCISE PROGRAM [[email or web address]]  Prone B shoulder ER for lower traps -  Repeat 20 Repetitions, Hold 5 Seconds, Complete 2 Sets, Perform 1 Times a Day  ASSESSMENT:  CLINICAL IMPRESSION:    ***   Patient would benefit from continued management of limiting condition by skilled physical therapist to address remaining impairments and functional limitations to work towards stated goals and return to PLOF or maximal functional independence.   From initial PT evaluation  on 02/08/24:  Patient is a 38 y.o. male referred to outpatient physical therapy with a medical  diagnosis of left rotator cuff tendinitis who presents with signs and symptoms consistent with left shoulder pain and L UE weakness and paresthesia following an injury in June. He appears to have myotomal weakness at C5 and C6 that improves with cervical unloading thorough distraction, suggesting a cervical spine contribution to his condition. This would match with the has left sided upper trap discomfort he experiences with left cervical spine rotation, sidebending, and extension and the intermittent paresthesia to his hand. However, his DTRs appear equal and intact bilaterally. C5 myotomes tested today also correspond to the same motions  most commonly affected by a RTC condition (shoulder ER and abduction). Plan to address RTC and shoulder as well as any cervical spine contributions. Patient has good motion B shoulders except some limited overhead movement in the L. Patient presents with significant pain, ROM, joint stiffness, muscle performance (strength/power/endurance), paresthesia, and activity tolerance impairments that are limiting ability to complete all activities that require use of L UE including reaching, carrying, lifting, restraining and holding animals, working, sleeping, housework, peparing food, grooming, pushing, driving, yardwork, dressing, using tools or appliances, laundry, throwing a ball, playing basket ball, going to the gym without difficulty. Patient will benefit from skilled physical therapy intervention to address current body structure impairments and activity limitations to improve function and work towards goals set in current POC in order to return to prior level of function or maximal functional improvement.    OBJECTIVE IMPAIRMENTS: decreased activity tolerance, decreased endurance, decreased knowledge of condition, decreased ROM, decreased strength, impaired UE functional use, improper body mechanics, postural dysfunction, and pain.   ACTIVITY LIMITATIONS: carrying, lifting,  sleeping, bathing, dressing, reach over head, hygiene/grooming, and caring for others  PARTICIPATION LIMITATIONS: meal prep, cleaning, laundry, interpersonal relationship, driving, shopping, community activity, occupation, and yard work  PERSONAL FACTORS: Profession and 3+ comorbidities:   Allergic rhinitis; Bilateral carpal tunnel syndrome; Boutonniere deformity; Diffuse large B-cell lymphoma of lymph nodes of neck (HCC); Gastroesophageal reflux disease; Hip pain; History of  antineoplastic chemotherapy; Lumbar sprain; Non-Hodgkin lymphoma (HCC);  Tear of right glenoid labrum; Shoulder joint pain; History of radiation therapy;  Cervical spine pain; Cervical radiculopathy; Cervical spondylosis;  and Degeneration of lumbar intervertebral disc on their problem list. he  has a past medical history of Arthritis,  Non Hodgkin's lymphoma (HCC), and Seizures (as a baby). he  has a past surgical history that includes Wisdom tooth extraction; Lymph node biopsy; and Shoulder arthroscopy with labral repair (Right, 06/14/2017) are also affecting patient's functional outcome.   REHAB POTENTIAL: Good  CLINICAL DECISION MAKING: Evolving/moderate complexity  EVALUATION COMPLEXITY: Moderate   GOALS: Goals reviewed with patient? No  SHORT TERM GOALS: Target date: 02/22/2024  Patient will be independent with initial home exercise program for self-management of symptoms. Baseline: Initial HEP to be provided at visit 2 as appropriate (02/08/24); Goal status: MET  LONG TERM GOALS: Target date: 05/02/2024  Patient will be independent with a long-term home exercise program for self-management of symptoms.  Baseline: Initial HEP to be provided at visit 2 as appropriate (02/08/24); completes HEP daily (03/29/2024); reports participation (06/05/2024); Goal status: in progress  2.  Patient will demonstrate improved Upper Extremity Functional Scale (UEFS) to equal or greater than 64/80 to demonstrate improvement in  overall condition and self-reported functional ability.  Baseline: 34/80 (02/08/24); 05/02/24: 50/80; 06/05/24: 58/80  Goal status: In progress  3.  Patient will demonstrate pain free L UE strength equal or greater to R UE strength to improve his ability to complete his job duties.  Baseline: decreased and painful (02/08/24); 05/02/24: Bilaterally 4+/5; 06/05/24: 5/5 bilaterally Goal status: MET  4.  Patient will demonstrate L shoulder AROM/PROM pain free and equal or greater to R shoulder to improve his ability to reach overhead and move into awkward positions while performing his job duties and leisure activities such as basketball and going to the gym.  Baseline: limited in overhead movement and painful (02/08/24); 05/02/24: pain with L shoulder IR; 06/05/24: pain and tightness in L shoulder with ROM above shoulder height Goal status: MET  5.  Patient will demonstrate improvement in Patient Specific Functional Scale (PSFS) of equal or greater than 8/10 points to reflect clinically significant improvement in patient's most valued functional activities. Baseline: to be measured at visit 2 as appropriate (02/08/24); 1/10 (02/10/24); did not capture (03/29/24); 05/02/24: 5/10; 5.5/10 (06/05/24) Goal status: In progress  6.  Patient will report NPRS equal or less than 2/10 during functional activities during the last 2 weeks to improve their abilitly to complete community, work and/or recreational activities with less limitation. Baseline: 8/10 (02/08/24); 5/10 (03/20/2024); 05/02/24: 6/10; 06/05/24: 5/10 Goal status: In progress  PLAN:  PT FREQUENCY: 1-2x/week  PT DURATION: 12 weeks  PLANNED INTERVENTIONS: 97164- PT Re-evaluation, 97750- Physical Performance Testing, 97110-Therapeutic exercises, 97530- Therapeutic activity, [id number]- Neuromuscular re-education, 97535- Self Care, [id number]- Manual therapy, [id number]- Electrical stimulation (unattended), [phone]- Traction (mechanical), 20560 (1-2 muscles),  20561 (3+ muscles)- Dry Needling, Patient/Family education, Joint mobilization, Spinal mobilization, Cryotherapy, and Moist heat  PLAN FOR NEXT SESSION: ***Plan to continue PT to continue working on shoulder stability and overhead reaching, as well as finalize a long term HEP. Consider adding functional shoulder stabilization exercises such as resisted shoulder flexion wall slides, resisted shoulder 3 way reaches, resisted shoulder ER/IR as well as cervical retractions and upper trap strethces.   Vernell Rise, SPT  [facility] [address] [place], [place] [address] P: [phone] I F: [phone]

## 2024-06-08 ENCOUNTER — Encounter: Payer: Self-pay | Admitting: Physical Therapy

## 2024-06-08 ENCOUNTER — Ambulatory Visit: Admitting: Physical Therapy

## 2024-06-08 DIAGNOSIS — M25512 Pain in left shoulder: Secondary | ICD-10-CM

## 2024-06-08 DIAGNOSIS — M79641 Pain in right hand: Secondary | ICD-10-CM | POA: Diagnosis not present

## 2024-06-08 DIAGNOSIS — M792 Neuralgia and neuritis, unspecified: Secondary | ICD-10-CM

## 2024-06-08 DIAGNOSIS — M79642 Pain in left hand: Secondary | ICD-10-CM | POA: Diagnosis not present

## 2024-06-08 DIAGNOSIS — M6281 Muscle weakness (generalized): Secondary | ICD-10-CM | POA: Diagnosis not present

## 2024-06-12 NOTE — Therapy (Deleted)
 OUTPATIENT PHYSICAL THERAPY TREATMENT/DISCHARGE   Patient Name: Thomas Mckay MRN: [id number] DOB:Mar 23, 1986, 38 y.o., male Today's Date: 06/12/2024  END OF SESSION:   Past Medical History:  Diagnosis Date   Arthritis    wrists   History of chlamydia    History of herpes genitalis    Non Hodgkin's lymphoma (HCC)    in remission. completed treatments 08/2012   Seizures (HCC)    x1. as infant   Past Surgical History:  Procedure Laterality Date   LYMPH NODE BIOPSY     SHOULDER ARTHROSCOPY WITH LABRAL REPAIR Right 06/14/2017   Procedure: SHOULDER ARTHROSCOPY WITH LABRAL REPAIR and subacromial debridement.;  Surgeon: Marchia Drivers, MD;  Location: [facility];  Service: Orthopedics;  Laterality: Right;   WISDOM TOOTH EXTRACTION     Patient Active Problem List   Diagnosis Date Noted   Knee pain, bilateral 04/28/2022   Anxiety due to invasive procedure 02/19/2022   Degeneration of lumbar intervertebral disc 02/19/2022   Stiffness of right knee 07/03/2021   Cervical spine pain 04/02/2021   Cervical radiculopathy 04/02/2021   Cervical spondylosis 04/02/2021   Strain of neck muscle 04/02/2021   Right knee pain 12/20/2020   Research study patient 12/06/2019   Abdominal pain 02/22/2019   Proteinuria 02/22/2019   Asymptomatic microscopic hematuria 02/22/2019   Pain in joint involving ankle and foot 02/01/2019   Achilles bursitis 02/01/2019   Achilles tendinitis 02/01/2019   Hip pain 02/01/2019   Lumbar sprain 02/01/2019   Sprain of deltoid ligament of ankle 02/01/2019   Sprain of hand 02/01/2019   Pre-bariatric surgery nutrition evaluation 06/03/2017   Gastroesophageal reflux disease 05/24/2017   Type 2 HSV infection of penis 05/24/2017   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   Shoulder joint pain 05/14/2017   Non-Hodgkin lymphoma (HCC) 04/14/2017   Bilateral carpal tunnel syndrome 01/28/2017   Tear of right glenoid labrum 10/09/2014   Diffuse large 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) 08/02/2014   History of antineoplastic chemotherapy 11/22/2012   History of radiation therapy 11/22/2012   Allergic rhinitis 08/18/2007   PCP: Claudene Tanda POUR, PA-C  REFERRING PROVIDER: Kathlynn Sharper, MD  REFERRING DIAG: Left rotator cuff tendinitis  THERAPY DIAG:  Bilateral hand pain  Acute pain of left shoulder  Muscle weakness (generalized)  Neuralgia and neuritis  Rationale for Evaluation and Treatment: Rehabilitation  ONSET DATE: June 2025  SUBJECTIVE:                                                                                                                                                                                      PERTINENT HISTORY: Patient is a 38 y.o.  male who presents to outpatient physical therapy with a referral for medical diagnosis left rotator cuff tendinitis. This patient's chief complaints consist of left shoulder pain and arm weakness with intermittent paresthesia to the hand, leading to the following functional deficits: difficulty with all activities that require use of L UE including reaching, carrying, lifting, restraining and holding animals, working, sleeping, housework, peparing food, grooming, pushing, driving, yardwork, dressing, using tools or appliances, laundry, throwing a ball, playing basket ball, going to the gym. Relevant past medical history and comorbidities include the following: he has Pain in joint involving ankle and foot; Achilles bursitis; Achilles tendinitis; Allergic rhinitis; Bilateral carpal tunnel syndrome; Boutonniere deformity; Diffuse large B-cell lymphoma of lymph nodes of neck (HCC); Gastroesophageal reflux disease; Hip pain; History of antineoplastic chemotherapy; Lumbar sprain; Non-Hodgkin lymphoma (HCC); Sprain of deltoid ligament of ankle; Sprain of hand; Tear of right glenoid labrum; Type 2 HSV infection of penis; Shoulder joint pain; History of radiation therapy; Abdominal pain;  Proteinuria; Asymptomatic microscopic hematuria; Pre-bariatric surgery nutrition evaluation; Research study patient; Right knee pain; Cervical spine pain; Cervical radiculopathy; Cervical spondylosis; Strain of neck muscle; Stiffness of right knee; Knee pain, bilateral;  and Degeneration of lumbar intervertebral disc on their problem list. he  has a past medical history of Arthritis, History of chlamydia, History of herpes genitalis, Non Hodgkin's lymphoma (HCC), and Seizures (as a baby). he  has a past surgical history that includes Wisdom tooth extraction; Lymph node biopsy; and Shoulder arthroscopy with labral repair (Right, 06/14/2017). Patient denies hx of stroke, lung problems, heart problems, diabetes, unexplained weight loss, unexplained changes in bowel or bladder problems, unexplained stumbling or dropping things, osteoporosis, and spinal surgery, or left shoulder surgery.   Exercise history: likes to go to the gym and play basketball. Might have a 5#DB. Also has curling bench press thing. He still has a photographer.   Right handed  SUBJECTIVE STATEMENT: *** PAIN: NPRS: Current: ***  Best: 2-3/10, Worst: 8/10. Pain location: inside left shoulder, worst at anterior shoulder near pec, numbness over his whole left hand (currently there because he was poking the front of his shoulder).  Pain description: dull, Aggravating factors: the more he uses his left shoulder for anything weight wise including holding up kittens that are 2-3 lbs, reaching, sleeping on his left side (does not wake him), housework Relieving factors: as soon as he wakes up, hot bath  PRECAUTIONS: Other: don't lift anything over 10 lbs with left UE  PATIENT GOALS: back to normal hoping it ain't going to tear  NEXT MD VISIT: 5 weeks from last visit with Dr. Kathlynn   OBJECTIVE                                                                                                               TREATMENT:  Therapeutic  exercise: therapeutic exercises that incorporate ONE parameter at one or more areas of the body to centralize symptoms, develop strength and endurance, range of motion, and flexibility required for successful completion of functional activities.  Standing Resisted shoulder ER with RTB to improve shoulder strength  2 x 10 each way  Pt required cueing for elbow placement at the side  L more challenging than R  Standing Resisted shoulder IR with GTB to improve shoulder strength  2 x 10   Seated Upper trap stretches to reduce tension   2 x 30 seconds each side   Seated Levator scapulae stretch to reduce tension   2 x 30 seconds each side    Standing Doorway L pec stretch at 90   2 x 30 seconds  Therapeutic activities: dynamic therapeutic activities incorporating MULTIPLE parameters or areas of the body designed to achieve improved functional performance.  Standing Shoulder flexion wall slides to strengthen scapular stabilizers and improve ROM and reaching   2 x 10   Pt stated they felt it toward the end of the second set  Standing Resisted shoulder 3 way reaches to strengthen shoulders and improve ROM and reaching   2 x 10   Pt stated they felt it more in the L anterior shoulder than the R  Neuromuscular Re-education: a technique or exercise performed with the goal of improving the level of communication between the body and the brain, such as for balance, motor control, muscle activation patterns, coordination, desensitization, quality of muscle contraction, proprioception, and/or kinesthetic sense needed for successful and safe completion of functional activities.   Standing Resisted shoulder extension with RTB with cueing for shoulder placement down and back for posturing and strength with good motor control  2 x 10  Pt required cueing for shoulder placement down and back  PATIENT EDUCATION: Education details: Exercise purpose/form. Self management techniques. Education on  diagnosis, prognosis, POC, anatomy and physiology of current condition. Education on imaging.  Person educated: Patient Education method: Explanation Education comprehension: verbalized understanding and needs further education  HOME EXERCISE PROGRAM: Access Code: [email or web address] URL: [email or web address] Date: 03/07/2024 Prepared by: Camie Cleverly  Program Notes [email or web address]   Exercises - Lat Pull with band/cable  - 1 x daily - 2 sets - 10 reps - 5 seconds hold - Prone Shoulder External Rotation with Towel Roll  - 1 x daily - 3 sets - 15 reps  HOME EXERCISE PROGRAM [[email or web address]]  Prone B shoulder ER for lower traps -  Repeat 20 Repetitions, Hold 5 Seconds, Complete 2 Sets, Perform 1 Times a Day  ASSESSMENT:  CLINICAL IMPRESSION:    ***  From initial PT evaluation  on 02/08/24:  Patient is a 38 y.o. male referred to outpatient physical therapy with a medical diagnosis of left rotator cuff tendinitis who presents with signs and symptoms consistent with left shoulder pain and L UE weakness and paresthesia following an injury in June. He appears to have myotomal weakness at C5 and C6 that improves with cervical unloading thorough distraction, suggesting a cervical spine contribution to his condition. This would match with the has left sided upper trap discomfort he experiences with left cervical spine rotation, sidebending, and extension and the intermittent paresthesia to his hand. However, his DTRs appear equal and intact bilaterally. C5 myotomes tested today also correspond to the same motions  most commonly affected by a RTC condition (shoulder ER and abduction). Plan to address RTC and shoulder as well as any cervical spine contributions. Patient has good motion B shoulders except some limited overhead movement in the L. Patient presents with significant pain, ROM, joint stiffness, muscle performance (strength/power/endurance), paresthesia,  and activity tolerance impairments that are limiting ability to complete all  activities that require use of L UE including reaching, carrying, lifting, restraining and holding animals, working, sleeping, housework, peparing food, grooming, pushing, driving, yardwork, dressing, using tools or appliances, laundry, throwing a ball, playing basket ball, going to the gym without difficulty. Patient will benefit from skilled physical therapy intervention to address current body structure impairments and activity limitations to improve function and work towards goals set in current POC in order to return to prior level of function or maximal functional improvement.    OBJECTIVE IMPAIRMENTS: decreased activity tolerance, decreased endurance, decreased knowledge of condition, decreased ROM, decreased strength, impaired UE functional use, improper body mechanics, postural dysfunction, and pain.   ACTIVITY LIMITATIONS: carrying, lifting, sleeping, bathing, dressing, reach over head, hygiene/grooming, and caring for others  PARTICIPATION LIMITATIONS: meal prep, cleaning, laundry, interpersonal relationship, driving, shopping, community activity, occupation, and yard work  PERSONAL FACTORS: Profession and 3+ comorbidities:   Allergic rhinitis; Bilateral carpal tunnel syndrome; Boutonniere deformity; Diffuse large B-cell lymphoma of lymph nodes of neck (HCC); Gastroesophageal reflux disease; Hip pain; History of antineoplastic chemotherapy; Lumbar sprain; Non-Hodgkin lymphoma (HCC);  Tear of right glenoid labrum; Shoulder joint pain; History of radiation therapy;  Cervical spine pain; Cervical radiculopathy; Cervical spondylosis;  and Degeneration of lumbar intervertebral disc on their problem list. he  has a past medical history of Arthritis,  Non Hodgkin's lymphoma (HCC), and Seizures (as a baby). he  has a past surgical history that includes Wisdom tooth extraction; Lymph node biopsy; and Shoulder arthroscopy with  labral repair (Right, 06/14/2017) are also affecting patient's functional outcome.   REHAB POTENTIAL: Good  CLINICAL DECISION MAKING: Evolving/moderate complexity  EVALUATION COMPLEXITY: Moderate   GOALS: Goals reviewed with patient? No  SHORT TERM GOALS: Target date: 02/22/2024  Patient will be independent with initial home exercise program for self-management of symptoms. Baseline: Initial HEP to be provided at visit 2 as appropriate (02/08/24); Goal status: MET  LONG TERM GOALS: Target date: 05/02/2024  Patient will be independent with a long-term home exercise program for self-management of symptoms.  Baseline: Initial HEP to be provided at visit 2 as appropriate (02/08/24); completes HEP daily (03/29/2024); reports participation (06/05/2024); Goal status: in progress  2.  Patient will demonstrate improved Upper Extremity Functional Scale (UEFS) to equal or greater than 64/80 to demonstrate improvement in overall condition and self-reported functional ability.  Baseline: 34/80 (02/08/24); 05/02/24: 50/80; 06/05/24: 58/80  Goal status: In progress  3.  Patient will demonstrate pain free L UE strength equal or greater to R UE strength to improve his ability to complete his job duties.  Baseline: decreased and painful (02/08/24); 05/02/24: Bilaterally 4+/5; 06/05/24: 5/5 bilaterally Goal status: MET  4.  Patient will demonstrate L shoulder AROM/PROM pain free and equal or greater to R shoulder to improve his ability to reach overhead and move into awkward positions while performing his job duties and leisure activities such as basketball and going to the gym.  Baseline: limited in overhead movement and painful (02/08/24); 05/02/24: pain with L shoulder IR; 06/05/24: pain and tightness in L shoulder with ROM above shoulder height Goal status: MET  5.  Patient will demonstrate improvement in Patient Specific Functional Scale (PSFS) of equal or greater than 8/10 points to reflect  clinically significant improvement in patient's most valued functional activities. Baseline: to be measured at visit 2 as appropriate (02/08/24); 1/10 (02/10/24); did not capture (03/29/24); 05/02/24: 5/10; 5.5/10 (06/05/24) Goal status: In progress  6.  Patient will report NPRS equal or less than 2/10  during functional activities during the last 2 weeks to improve their abilitly to complete community, work and/or recreational activities with less limitation. Baseline: 8/10 (02/08/24); 5/10 (03/20/2024); 05/02/24: 6/10; 06/05/24: 5/10 Goal status: In progress  PLAN:  PT FREQUENCY: 1-2x/week  PT DURATION: 12 weeks  PLANNED INTERVENTIONS: 97164- PT Re-evaluation, 97750- Physical Performance Testing, 97110-Therapeutic exercises, 97530- Therapeutic activity, [id number]- Neuromuscular re-education, 97535- Self Care, [id number]- Manual therapy, [id number]- Electrical stimulation (unattended), [phone]- Traction (mechanical), 20560 (1-2 muscles), 20561 (3+ muscles)- Dry Needling, Patient/Family education, Joint mobilization, Spinal mobilization, Cryotherapy, and Moist heat  PLAN FOR NEXT SESSION: ***  Vernell Rise, SPT  [facility] [address] [place], [place] [address] P: [phone] I F: [phone]

## 2024-06-13 ENCOUNTER — Ambulatory Visit: Admitting: Physical Therapy

## 2024-06-13 DIAGNOSIS — M25512 Pain in left shoulder: Secondary | ICD-10-CM

## 2024-06-13 DIAGNOSIS — M6281 Muscle weakness (generalized): Secondary | ICD-10-CM

## 2024-06-13 DIAGNOSIS — M792 Neuralgia and neuritis, unspecified: Secondary | ICD-10-CM

## 2024-06-13 DIAGNOSIS — M79641 Pain in right hand: Secondary | ICD-10-CM

## 2024-06-20 ENCOUNTER — Ambulatory Visit: Admitting: Physical Therapy

## 2024-06-22 ENCOUNTER — Ambulatory Visit: Admitting: Physical Therapy

## 2024-06-27 ENCOUNTER — Ambulatory Visit: Admitting: Physical Therapy

## 2024-06-27 NOTE — Therapy (Unsigned)
 OUTPATIENT PHYSICAL THERAPY TREATMENT/DISCHARGE   Patient Name: Thomas Mckay MRN: [id number] DOB:June 01, 1986, 38 y.o., male Today's Date: 06/27/2024  END OF SESSION:   Past Medical History:  Diagnosis Date   Arthritis    wrists   History of chlamydia    History of herpes genitalis    Non Hodgkin's lymphoma (HCC)    in remission. completed treatments 08/2012   Seizures (HCC)    x1. as infant   Past Surgical History:  Procedure Laterality Date   LYMPH NODE BIOPSY     SHOULDER ARTHROSCOPY WITH LABRAL REPAIR Right 06/14/2017   Procedure: SHOULDER ARTHROSCOPY WITH LABRAL REPAIR and subacromial debridement.;  Surgeon: Marchia Drivers, MD;  Location: [facility];  Service: Orthopedics;  Laterality: Right;   WISDOM TOOTH EXTRACTION     Patient Active Problem List   Diagnosis Date Noted   Knee pain, bilateral 04/28/2022   Anxiety due to invasive procedure 02/19/2022   Degeneration of lumbar intervertebral disc 02/19/2022   Stiffness of right knee 07/03/2021   Cervical spine pain 04/02/2021   Cervical radiculopathy 04/02/2021   Cervical spondylosis 04/02/2021   Strain of neck muscle 04/02/2021   Right knee pain 12/20/2020   Research study patient 12/06/2019   Abdominal pain 02/22/2019   Proteinuria 02/22/2019   Asymptomatic microscopic hematuria 02/22/2019   Pain in joint involving ankle and foot 02/01/2019   Achilles bursitis 02/01/2019   Achilles tendinitis 02/01/2019   Hip pain 02/01/2019   Lumbar sprain 02/01/2019   Sprain of deltoid ligament of ankle 02/01/2019   Sprain of hand 02/01/2019   Pre-bariatric surgery nutrition evaluation 06/03/2017   Gastroesophageal reflux disease 05/24/2017   Type 2 HSV infection of penis 05/24/2017   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   Shoulder joint pain 05/14/2017   Non-Hodgkin lymphoma (HCC) 04/14/2017   Bilateral carpal tunnel syndrome 01/28/2017   Tear of right glenoid labrum 10/09/2014   Diffuse large 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) 08/02/2014   History of antineoplastic chemotherapy 11/22/2012   History of radiation therapy 11/22/2012   Allergic rhinitis 08/18/2007   PCP: Claudene Tanda POUR, PA-C  REFERRING PROVIDER: Kathlynn Sharper, MD  REFERRING DIAG: Left rotator cuff tendinitis  THERAPY DIAG:  Bilateral hand pain  Acute pain of left shoulder  Muscle weakness (generalized)  Neuralgia and neuritis  Rationale for Evaluation and Treatment: Rehabilitation  ONSET DATE: June 2025  SUBJECTIVE:                                                                                                                                                                                      PERTINENT HISTORY: Patient is a 38 y.o.  male who presents to outpatient physical therapy with a referral for medical diagnosis left rotator cuff tendinitis. This patient's chief complaints consist of left shoulder pain and arm weakness with intermittent paresthesia to the hand, leading to the following functional deficits: difficulty with all activities that require use of L UE including reaching, carrying, lifting, restraining and holding animals, working, sleeping, housework, peparing food, grooming, pushing, driving, yardwork, dressing, using tools or appliances, laundry, throwing a ball, playing basket ball, going to the gym. Relevant past medical history and comorbidities include the following: he has Pain in joint involving ankle and foot; Achilles bursitis; Achilles tendinitis; Allergic rhinitis; Bilateral carpal tunnel syndrome; Boutonniere deformity; Diffuse large B-cell lymphoma of lymph nodes of neck (HCC); Gastroesophageal reflux disease; Hip pain; History of antineoplastic chemotherapy; Lumbar sprain; Non-Hodgkin lymphoma (HCC); Sprain of deltoid ligament of ankle; Sprain of hand; Tear of right glenoid labrum; Type 2 HSV infection of penis; Shoulder joint pain; History of radiation therapy; Abdominal pain;  Proteinuria; Asymptomatic microscopic hematuria; Pre-bariatric surgery nutrition evaluation; Research study patient; Right knee pain; Cervical spine pain; Cervical radiculopathy; Cervical spondylosis; Strain of neck muscle; Stiffness of right knee; Knee pain, bilateral;  and Degeneration of lumbar intervertebral disc on their problem list. he  has a past medical history of Arthritis, History of chlamydia, History of herpes genitalis, Non Hodgkin's lymphoma (HCC), and Seizures (as a baby). he  has a past surgical history that includes Wisdom tooth extraction; Lymph node biopsy; and Shoulder arthroscopy with labral repair (Right, 06/14/2017). Patient denies hx of stroke, lung problems, heart problems, diabetes, unexplained weight loss, unexplained changes in bowel or bladder problems, unexplained stumbling or dropping things, osteoporosis, and spinal surgery, or left shoulder surgery.   Exercise history: likes to go to the gym and play basketball. Might have a 5#DB. Also has curling bench press thing. He still has a photographer.   Right handed  SUBJECTIVE STATEMENT: *** PAIN: NPRS: Current: ***  Best: 2-3/10, Worst: 8/10. Pain location: inside left shoulder, worst at anterior shoulder near pec, numbness over his whole left hand (currently there because he was poking the front of his shoulder).  Pain description: dull, Aggravating factors: the more he uses his left shoulder for anything weight wise including holding up kittens that are 2-3 lbs, reaching, sleeping on his left side (does not wake him), housework Relieving factors: as soon as he wakes up, hot bath  PRECAUTIONS: Other: don't lift anything over 10 lbs with left UE  PATIENT GOALS: back to normal hoping it ain't going to tear  NEXT MD VISIT: 5 weeks from last visit with Dr. Kathlynn   OBJECTIVE                                                                                                               TREATMENT:  Therapeutic  exercise: therapeutic exercises that incorporate ONE parameter at one or more areas of the body to centralize symptoms, develop strength and endurance, range of motion, and flexibility required for successful completion of functional activities.  Standing Resisted shoulder ER with RTB to improve shoulder strength  2 x 10 each way  Pt required cueing for elbow placement at the side  L more challenging than R  Standing Resisted shoulder IR with GTB to improve shoulder strength  2 x 10   Seated Upper trap stretches to reduce tension   2 x 30 seconds each side   Seated Levator scapulae stretch to reduce tension   2 x 30 seconds each side    Standing Doorway L pec stretch at 90   2 x 30 seconds  Therapeutic activities: dynamic therapeutic activities incorporating MULTIPLE parameters or areas of the body designed to achieve improved functional performance.  Standing Shoulder flexion wall slides to strengthen scapular stabilizers and improve ROM and reaching   2 x 10   Pt stated they felt it toward the end of the second set  Standing Resisted shoulder 3 way reaches to strengthen shoulders and improve ROM and reaching   2 x 10   Pt stated they felt it more in the L anterior shoulder than the R  Neuromuscular Re-education: a technique or exercise performed with the goal of improving the level of communication between the body and the brain, such as for balance, motor control, muscle activation patterns, coordination, desensitization, quality of muscle contraction, proprioception, and/or kinesthetic sense needed for successful and safe completion of functional activities.   Standing Resisted shoulder extension with RTB with cueing for shoulder placement down and back for posturing and strength with good motor control  2 x 10  Pt required cueing for shoulder placement down and back  PATIENT EDUCATION: Education details: Exercise purpose/form. Self management techniques. Education on  diagnosis, prognosis, POC, anatomy and physiology of current condition. Education on imaging.  Person educated: Patient Education method: Explanation Education comprehension: verbalized understanding and needs further education  HOME EXERCISE PROGRAM: Access Code: [email or web address] URL: [email or web address] Date: 06/27/2024 Prepared by: Vernell Rise  Program Notes [email or web address]   Exercises - Lat Pull with band/cable  - 1 x daily - 2 sets - 10 reps - 5 seconds hold - Prone Shoulder External Rotation with Towel Roll  - 1 x daily - 3 sets - 15 reps - Shoulder External Rotation with Anchored Resistance  - 1 x daily - 3 sets - 10 reps - Shoulder Internal Rotation with Resistance  - 1 x daily - 3 sets - 10 reps - Seated Upper Trapezius Stretch  - 1 x daily - 2 sets - 30 second hold - Seated Levator Scapulae Stretch  - 1 x daily - 2 sets - 30 seocnd hold - Single Arm Doorway Pec Stretch at 90 Degrees Abduction  - 1 x daily - 2 sets - 30 seocnd hold - Doorway Pec Stretch at 60 Degrees Abduction with Arm Straight  - 1 x daily - 2 sets - 30 seconds hold - Shoulder Flexion Wall Slide with Towel  - 1 x daily - 2 sets - 15 reps - Shoulder extension with resistance - Neutral  - 1 x daily - 2 sets - 10 reps - Shoulder 3 way Taps at Wall With Resistance Band  - 1 x daily - 2 sets - 10 reps  ASSESSMENT:  CLINICAL IMPRESSION:    ***  From initial PT evaluation  on 02/08/24:  Patient is a 38 y.o. male referred to outpatient physical therapy with a medical diagnosis of left rotator cuff tendinitis who presents with signs and symptoms consistent with left shoulder pain and L UE  weakness and paresthesia following an injury in June. He appears to have myotomal weakness at C5 and C6 that improves with cervical unloading thorough distraction, suggesting a cervical spine contribution to his condition. This would match with the has left sided upper trap  discomfort he experiences with left cervical spine rotation, sidebending, and extension and the intermittent paresthesia to his hand. However, his DTRs appear equal and intact bilaterally. C5 myotomes tested today also correspond to the same motions  most commonly affected by a RTC condition (shoulder ER and abduction). Plan to address RTC and shoulder as well as any cervical spine contributions. Patient has good motion B shoulders except some limited overhead movement in the L. Patient presents with significant pain, ROM, joint stiffness, muscle performance (strength/power/endurance), paresthesia, and activity tolerance impairments that are limiting ability to complete all activities that require use of L UE including reaching, carrying, lifting, restraining and holding animals, working, sleeping, housework, peparing food, grooming, pushing, driving, yardwork, dressing, using tools or appliances, laundry, throwing a ball, playing basket ball, going to the gym without difficulty. Patient will benefit from skilled physical therapy intervention to address current body structure impairments and activity limitations to improve function and work towards goals set in current POC in order to return to prior level of function or maximal functional improvement.    OBJECTIVE IMPAIRMENTS: decreased activity tolerance, decreased endurance, decreased knowledge of condition, decreased ROM, decreased strength, impaired UE functional use, improper body mechanics, postural dysfunction, and pain.   ACTIVITY LIMITATIONS: carrying, lifting, sleeping, bathing, dressing, reach over head, hygiene/grooming, and caring for others  PARTICIPATION LIMITATIONS: meal prep, cleaning, laundry, interpersonal relationship, driving, shopping, community activity, occupation, and yard work  PERSONAL FACTORS: Profession and 3+ comorbidities:   Allergic rhinitis; Bilateral carpal tunnel syndrome; Boutonniere deformity; Diffuse large B-cell  lymphoma of lymph nodes of neck (HCC); Gastroesophageal reflux disease; Hip pain; History of antineoplastic chemotherapy; Lumbar sprain; Non-Hodgkin lymphoma (HCC);  Tear of right glenoid labrum; Shoulder joint pain; History of radiation therapy;  Cervical spine pain; Cervical radiculopathy; Cervical spondylosis;  and Degeneration of lumbar intervertebral disc on their problem list. he  has a past medical history of Arthritis,  Non Hodgkin's lymphoma (HCC), and Seizures (as a baby). he  has a past surgical history that includes Wisdom tooth extraction; Lymph node biopsy; and Shoulder arthroscopy with labral repair (Right, 06/14/2017) are also affecting patient's functional outcome.   REHAB POTENTIAL: Good  CLINICAL DECISION MAKING: Evolving/moderate complexity  EVALUATION COMPLEXITY: Moderate   GOALS: Goals reviewed with patient? No  SHORT TERM GOALS: Target date: 02/22/2024  Patient will be independent with initial home exercise program for self-management of symptoms. Baseline: Initial HEP to be provided at visit 2 as appropriate (02/08/24); Goal status: MET  LONG TERM GOALS: Target date: 05/02/2024  Patient will be independent with a long-term home exercise program for self-management of symptoms.  Baseline: Initial HEP to be provided at visit 2 as appropriate (02/08/24); completes HEP daily (03/29/2024); reports participation (06/05/2024); long term HEP Given (06/28/24) Goal status: MET  2.  Patient will demonstrate improved Upper Extremity Functional Scale (UEFS) to equal or greater than 64/80 to demonstrate improvement in overall condition and self-reported functional ability.  Baseline: 34/80 (02/08/24); 05/02/24: 50/80; 06/05/24: 58/80  Goal status: In progress  3.  Patient will demonstrate pain free L UE strength equal or greater to R UE strength to improve his ability to complete his job duties.  Baseline: decreased and painful (02/08/24); 05/02/24: Bilaterally 4+/5; 06/05/24: 5/5  bilaterally  Goal status: MET  4.  Patient will demonstrate L shoulder AROM/PROM pain free and equal or greater to R shoulder to improve his ability to reach overhead and move into awkward positions while performing his job duties and leisure activities such as basketball and going to the gym.  Baseline: limited in overhead movement and painful (02/08/24); 05/02/24: pain with L shoulder IR; 06/05/24: pain and tightness in L shoulder with ROM above shoulder height Goal status: MET  5.  Patient will demonstrate improvement in Patient Specific Functional Scale (PSFS) of equal or greater than 8/10 points to reflect clinically significant improvement in patient's most valued functional activities. Baseline: to be measured at visit 2 as appropriate (02/08/24); 1/10 (02/10/24); did not capture (03/29/24); 05/02/24: 5/10; 5.5/10 (06/05/24) Goal status: In progress  6.  Patient will report NPRS equal or less than 2/10 during functional activities during the last 2 weeks to improve their abilitly to complete community, work and/or recreational activities with less limitation. Baseline: 8/10 (02/08/24); 5/10 (03/20/2024); 05/02/24: 6/10; 06/05/24: 5/10 Goal status: In progress  PLAN:  PT FREQUENCY: 1-2x/week  PT DURATION: 12 weeks  PLANNED INTERVENTIONS: 97164- PT Re-evaluation, 97750- Physical Performance Testing, 97110-Therapeutic exercises, 97530- Therapeutic activity, [id number]- Neuromuscular re-education, 97535- Self Care, 02859- Manual therapy, [id number]- Electrical stimulation (unattended), 02987- Traction (mechanical), 20560 (1-2 muscles), 20561 (3+ muscles)- Dry Needling, Patient/Family education, Joint mobilization, Spinal mobilization, Cryotherapy, and Moist heat  PLAN FOR NEXT SESSION: patient has been discharged from therapy at this time with long term HEP.  Vernell Rise, SPT  [facility] [address] [place], [place] [address] P: [phone] I F:  [phone]

## 2024-06-28 ENCOUNTER — Ambulatory Visit: Attending: Orthopedic Surgery | Admitting: Physical Therapy

## 2024-06-28 DIAGNOSIS — M792 Neuralgia and neuritis, unspecified: Secondary | ICD-10-CM | POA: Insufficient documentation

## 2024-06-28 DIAGNOSIS — M79642 Pain in left hand: Secondary | ICD-10-CM | POA: Insufficient documentation

## 2024-06-28 DIAGNOSIS — M79641 Pain in right hand: Secondary | ICD-10-CM | POA: Insufficient documentation

## 2024-06-28 DIAGNOSIS — M6281 Muscle weakness (generalized): Secondary | ICD-10-CM | POA: Insufficient documentation

## 2024-06-28 DIAGNOSIS — M25512 Pain in left shoulder: Secondary | ICD-10-CM | POA: Insufficient documentation

## 2024-06-28 NOTE — Addendum Note (Signed)
 Addended by: JULI CREDIT R on: 06/28/2024 11:00 AM   Modules accepted: Orders

## 2024-06-29 ENCOUNTER — Ambulatory Visit: Admitting: Physical Therapy

## 2024-07-04 ENCOUNTER — Ambulatory Visit: Admitting: Physical Therapy

## 2024-07-06 ENCOUNTER — Ambulatory Visit: Admitting: Physical Therapy
